# Patient Record
Sex: Female | Born: 1950 | Race: Black or African American | Hispanic: No | Smoking: Never smoker
Health system: Southern US, Academic
[De-identification: ages and names within clinical notes are randomized; demographics above are authoritative.]

## PROBLEM LIST (undated history)

## (undated) DIAGNOSIS — I10 Essential (primary) hypertension: Secondary | ICD-10-CM

## (undated) DIAGNOSIS — I2510 Atherosclerotic heart disease of native coronary artery without angina pectoris: Secondary | ICD-10-CM

## (undated) DIAGNOSIS — F329 Major depressive disorder, single episode, unspecified: Secondary | ICD-10-CM

## (undated) DIAGNOSIS — E669 Obesity, unspecified: Secondary | ICD-10-CM

## (undated) DIAGNOSIS — F32A Depression, unspecified: Secondary | ICD-10-CM

## (undated) DIAGNOSIS — Z973 Presence of spectacles and contact lenses: Secondary | ICD-10-CM

## (undated) DIAGNOSIS — E785 Hyperlipidemia, unspecified: Secondary | ICD-10-CM

## (undated) HISTORY — PX: HX CERVICAL SPINE SURGERY: 2100001197

## (undated) HISTORY — PX: HX LAMINOPLASTY: 2100001315

## (undated) HISTORY — PX: HX TMJ ARTHOTOMY: SHX35

## (undated) HISTORY — PX: HX CERVICAL DISKECTOMY: SHX98

## (undated) HISTORY — PX: HX HEART CATHETERIZATION: SHX148

## (undated) HISTORY — PX: CORONARY ARTERY ANGIOPLASTY: PR CATH30428

## (undated) HISTORY — PX: HX HYSTERECTOMY: SHX81

---
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
## 2012-01-29 ENCOUNTER — Other Ambulatory Visit (HOSPITAL_BASED_OUTPATIENT_CLINIC_OR_DEPARTMENT_OTHER): Payer: Self-pay | Admitting: EXTERNAL

## 2012-02-01 ENCOUNTER — Ambulatory Visit (HOSPITAL_BASED_OUTPATIENT_CLINIC_OR_DEPARTMENT_OTHER)
Admission: RE | Admit: 2012-02-01 | Discharge: 2012-02-01 | Disposition: A | Discharge status: Home / Self Care | Payer: Self-pay | Source: Ambulatory Visit

## 2012-02-01 ENCOUNTER — Ambulatory Visit (HOSPITAL_BASED_OUTPATIENT_CLINIC_OR_DEPARTMENT_OTHER)
Admission: RE | Admit: 2012-02-01 | Discharge: 2012-02-01 | Disposition: A | Discharge status: Home / Self Care | Payer: Self-pay | Source: Ambulatory Visit | Attending: EXTERNAL | Admitting: EXTERNAL

## 2012-02-01 DIAGNOSIS — I079 Rheumatic tricuspid valve disease, unspecified: Secondary | ICD-10-CM

## 2012-02-01 DIAGNOSIS — I359 Nonrheumatic aortic valve disorder, unspecified: Secondary | ICD-10-CM

## 2012-02-01 DIAGNOSIS — I10 Essential (primary) hypertension: Secondary | ICD-10-CM | POA: Insufficient documentation

## 2012-10-23 ENCOUNTER — Ambulatory Visit (HOSPITAL_BASED_OUTPATIENT_CLINIC_OR_DEPARTMENT_OTHER)
Admission: RE | Admit: 2012-10-23 | Discharge: 2012-10-23 | Disposition: A | Discharge status: Home / Self Care | Payer: Self-pay | Source: Ambulatory Visit

## 2012-10-23 DIAGNOSIS — Z0289 Encounter for other administrative examinations: Secondary | ICD-10-CM | POA: Insufficient documentation

## 2012-10-23 LAB — RUBELLA VIRUS IGG AB: RUBELLA IGG: 45 IU/mL

## 2012-10-25 LAB — MUMPS VIRUS ANTIBODY, IGG, SERUM
INDEX VALUE: 2.1
MUMPS VIRUS ANTIBODY, IGG, SERUM: POSITIVE

## 2012-10-25 LAB — VARICELLA-ZOSTER ANTIBODY, IGG, SERUM
VARICELLA IGG AB INDEX: 2.8
VARICELLA-ZOSTER VIRUS (VZV) ANTIBODY, IGG, SERUM: POSITIVE

## 2012-10-25 LAB — HEP BS AB, QUANT, POST VACCINE - BMC/JMC ONLY: HEPATITIS B SURFACE AB,QUANT: 21 m[IU]/mL (ref >=10)

## 2012-10-25 LAB — RUBEOLA (MEASLES) ANTIBODIES SCREEN, IGG, SERUM
MEASLES IGG AB INDEX: >8
RUBEOLA (MEASLES) ANTIBODIES SCREEN, IGG, SERUM: POSITIVE

## 2012-12-16 ENCOUNTER — Encounter (HOSPITAL_BASED_OUTPATIENT_CLINIC_OR_DEPARTMENT_OTHER): Payer: Self-pay

## 2012-12-16 ENCOUNTER — Inpatient Hospital Stay (HOSPITAL_BASED_OUTPATIENT_CLINIC_OR_DEPARTMENT_OTHER)
Admission: EM | Admit: 2012-12-16 | Discharge: 2012-12-20 | DRG: 247 | Disposition: A | Discharge status: Home / Self Care | Payer: BC Managed Care – PPO | Attending: Internal Medicine | Admitting: Internal Medicine

## 2012-12-16 ENCOUNTER — Emergency Department (HOSPITAL_BASED_OUTPATIENT_CLINIC_OR_DEPARTMENT_OTHER): Payer: BC Managed Care – PPO

## 2012-12-16 DIAGNOSIS — I2510 Atherosclerotic heart disease of native coronary artery without angina pectoris: Secondary | ICD-10-CM | POA: Diagnosis present

## 2012-12-16 DIAGNOSIS — E669 Obesity, unspecified: Secondary | ICD-10-CM | POA: Diagnosis present

## 2012-12-16 DIAGNOSIS — I214 Non-ST elevation (NSTEMI) myocardial infarction: Principal | ICD-10-CM | POA: Diagnosis present

## 2012-12-16 DIAGNOSIS — Z7982 Long term (current) use of aspirin: Secondary | ICD-10-CM

## 2012-12-16 DIAGNOSIS — Z6833 Body mass index (BMI) 33.0-33.9, adult: Secondary | ICD-10-CM

## 2012-12-16 DIAGNOSIS — F411 Generalized anxiety disorder: Secondary | ICD-10-CM | POA: Diagnosis present

## 2012-12-16 DIAGNOSIS — I10 Essential (primary) hypertension: Secondary | ICD-10-CM | POA: Diagnosis present

## 2012-12-16 HISTORY — DX: Obesity, unspecified: E66.9

## 2012-12-16 HISTORY — DX: Hyperlipidemia, unspecified: E78.5

## 2012-12-16 HISTORY — DX: Presence of spectacles and contact lenses: Z97.3

## 2012-12-16 HISTORY — DX: Essential (primary) hypertension: I10

## 2012-12-16 HISTORY — DX: Atherosclerotic heart disease of native coronary artery without angina pectoris: I25.10

## 2012-12-16 LAB — COMPREHENSIVE METABOLIC PROFILE - BMC/JMC ONLY
ALBUMIN: 3.8 g/dL (ref 3.2–5.0)
ALKALINE PHOSPHATASE: 92 IU/L (ref 35–120)
ALT (SGPT): 17 IU/L (ref 0–55)
AST (SGOT): 18 IU/L (ref 0–45)
BILIRUBIN, TOTAL: 0.7 mg/dL (ref 0.0–1.3)
BUN: 15 mg/dL (ref 6–22)
CALCIUM: 9.4 mg/dL (ref 8.5–10.5)
CARBON DIOXIDE: 31 mmol/L (ref 22–32)
CHLORIDE: 103 mmol/L (ref 101–111)
CREATININE: 0.79 mg/dL (ref 0.53–1.00)
ESTIMATED GLOMERULAR FILTRATION RATE: >60 mL/min (ref >60)
GLUCOSE: 107 mg/dL (ref 70–110)
POTASSIUM: 3.4 mmol/L — ABNORMAL LOW (ref 3.5–5.0)
SODIUM: 138 mmol/L (ref 136–145)
TOTAL PROTEIN: 6.1 g/dL (ref 6.0–8.0)

## 2012-12-16 LAB — CBC
BASOPHIL #: 0.04 10*3/uL (ref 0.00–0.10)
BASOPHILS %: 0.4 % (ref 0.0–1.4)
EOSINOPHIL #: 0.29 K/uL (ref 0.00–0.50)
EOSINOPHIL %: 2.9 % (ref 0.0–5.2)
HCT: 40.9 % (ref 36.0–45.0)
HGB: 12.8 g/dL (ref 12.0–15.5)
LYMPHOCYTE #: 1.61 K/uL (ref 0.70–3.20)
LYMPHOCYTE %: 16.6 % (ref 15.0–43.0)
MCH: 24.8 pg — ABNORMAL LOW (ref 28.0–34.0)
MCHC: 31.2 g/dL — ABNORMAL LOW (ref 33.0–37.0)
MCV: 79.7 fL — ABNORMAL LOW (ref 82.0–97.0)
MONOCYTE #: 0.65 K/uL (ref 0.20–0.90)
MONOCYTE %: 6.7 % (ref 4.8–12.0)
MPV: 7.7 fL (ref 7.0–9.4)
PLATELET COUNT: 348 10*3/uL (ref 150–400)
PMN #: 7.15 10*3/uL — ABNORMAL HIGH (ref 1.50–6.50)
PMN %: 73.4 % (ref 43.0–76.0)
RBC: 5.14 M/uL — ABNORMAL HIGH (ref 4.00–5.10)
RDW: 13.9 % — ABNORMAL HIGH (ref 11.0–13.0)
WBC: 9.8 K/uL (ref 4.0–11.0)

## 2012-12-16 LAB — TROPONIN-I
TROPONIN-I: 2.65 ng/mL — ABNORMAL HIGH (ref 0.00–0.06)
TROPONIN-I: 1.65 ng/mL — ABNORMAL HIGH (ref 0.00–0.06)

## 2012-12-16 LAB — CREATINE KINASE (CK), TOTAL, SERUM
CREATINE KINASE (CK): 167 IU/L (ref 0–230)
CREATINE KINASE (CK): 168 IU/L (ref 0–230)

## 2012-12-16 LAB — POC TROPONIN I BEDSIDE - BMC ONLY: TROPONIN I BEDSIDE - CITY ONLY: 0.1 ng/mL — ABNORMAL HIGH (ref <0.05)

## 2012-12-16 LAB — CREATINE KINASE (CK), TOTAL, SERUM OR PLASMA: CREATINE KINASE (CK): 81 IU/L (ref 0–230)

## 2012-12-16 LAB — CREATINE KINASE (CK), MB FRACTION, SERUM
CK-MB: 3.8 ng/mL (ref 0.0–6.3)
CK-MB: 17.4 ng/mL — ABNORMAL HIGH (ref 0.0–6.3)

## 2012-12-16 MED ORDER — ASPIRIN 325 MG TABLET
325.0000 mg | ORAL_TABLET | Freq: Every day | ORAL | Status: DC
Start: 2012-12-16 — End: 2012-12-17
  Filled 2012-12-16: qty 1

## 2012-12-16 MED ORDER — MORPHINE 2 MG/ML INJECTION SYRINGE
INJECTION | INTRAMUSCULAR | Status: AC
Start: 2012-12-16 — End: 2012-12-16
  Administered 2012-12-16: 2 mg via INTRAVENOUS
  Filled 2012-12-16: qty 1

## 2012-12-16 MED ORDER — ROSUVASTATIN 10 MG TABLET
10.0000 mg | ORAL_TABLET | Freq: Every evening | ORAL | Status: DC
Start: 2012-12-16 — End: 2012-12-17
  Filled 2012-12-16: qty 1

## 2012-12-16 MED ORDER — MORPHINE 2 MG/ML INJECTION SYRINGE
2.00 mg | INJECTION | INTRAMUSCULAR | Status: AC
Start: 2012-12-16 — End: 2012-12-16

## 2012-12-16 MED ORDER — CITALOPRAM 20 MG TABLET
40.0000 mg | ORAL_TABLET | Freq: Every day | ORAL | Status: DC
Start: 2012-12-16 — End: 2012-12-16

## 2012-12-16 MED ORDER — ENOXAPARIN 80 MG/0.8 ML SUB-Q SYRINGE - EAST
80.00 mg | INJECTION | SUBCUTANEOUS | Status: AC
Start: 2012-12-16 — End: 2012-12-16
  Administered 2012-12-16: 80 mg via SUBCUTANEOUS
  Filled 2012-12-16: qty 0.8

## 2012-12-16 MED ORDER — HYDROCHLOROTHIAZIDE 25 MG TABLET
25.00 mg | ORAL_TABLET | Freq: Every day | ORAL | Status: DC
Start: 2012-12-17 — End: 2012-12-19
  Administered 2012-12-17 – 2012-12-18 (×2): 25 mg via ORAL
  Administered 2012-12-19: 0 mg via ORAL
  Filled 2012-12-16 (×2): qty 1

## 2012-12-16 MED ORDER — CITALOPRAM 20 MG TABLET
40.0000 mg | ORAL_TABLET | Freq: Every day | ORAL | Status: DC
Start: 2012-12-16 — End: 2012-12-20
  Administered 2012-12-16: 0 mg via ORAL
  Administered 2012-12-17 – 2012-12-20 (×4): 40 mg via ORAL
  Filled 2012-12-16 (×4): qty 2

## 2012-12-16 MED ORDER — SODIUM CHLORIDE 0.9 % (FLUSH) INJECTION SYRINGE
10.0000 mL | INJECTION | Freq: Three times a day (TID) | INTRAMUSCULAR | Status: DC
Start: 2012-12-16 — End: 2012-12-18

## 2012-12-16 MED ORDER — ENOXAPARIN 80 MG/0.8 ML SUB-Q SYRINGE - EAST
80.00 mg | INJECTION | Freq: Two times a day (BID) | SUBCUTANEOUS | Status: DC
Start: 2012-12-16 — End: 2012-12-17
  Administered 2012-12-16 – 2012-12-17 (×2): 80 mg via SUBCUTANEOUS
  Filled 2012-12-16 (×2): qty 0.8

## 2012-12-16 MED ORDER — METOPROLOL TARTRATE 100 MG TABLET
100.00 mg | ORAL_TABLET | Freq: Two times a day (BID) | ORAL | Status: DC
Start: 2012-12-16 — End: 2012-12-20
  Administered 2012-12-16 – 2012-12-18 (×4): 100 mg via ORAL
  Administered 2012-12-18: 0 mg via ORAL
  Administered 2012-12-19 – 2012-12-20 (×3): 100 mg via ORAL
  Filled 2012-12-16 (×7): qty 1

## 2012-12-16 MED ORDER — NITROGLYCERIN 2 % TRANSDERMAL OINTMENT - PACKET
1.0000 [in_us] | TOPICAL_OINTMENT | Freq: Once | TRANSDERMAL | Status: AC
Start: 2012-12-16 — End: 2012-12-16
  Administered 2012-12-16: 1 [in_us] via TOPICAL
  Filled 2012-12-16: qty 2

## 2012-12-16 MED ORDER — ASPIRIN 81 MG CHEWABLE TABLET
324.0000 mg | CHEWABLE_TABLET | Freq: Once | ORAL | Status: AC
Start: 2012-12-16 — End: 2012-12-16
  Administered 2012-12-16: 324 mg via ORAL
  Filled 2012-12-16: qty 4

## 2012-12-16 MED ORDER — SODIUM CHLORIDE 0.9 % (FLUSH) INJECTION SYRINGE
10.0000 mL | INJECTION | Freq: Three times a day (TID) | INTRAMUSCULAR | Status: DC
Start: 2012-12-16 — End: 2012-12-20

## 2012-12-16 MED ADMIN — rosuvastatin 10 mg tablet: 10 mg | ORAL | NDC 00310075139

## 2012-12-16 MED ADMIN — sodium chloride 0.9 % (flush) injection syringe: 10 mL | INTRAVENOUS | NDC 08290306546

## 2012-12-16 MED ADMIN — aspirin 325 mg tablet: 0 mg | ORAL

## 2012-12-16 MED ADMIN — sodium chloride 0.9 % (flush) injection syringe: 0 mL | INTRAVENOUS

## 2012-12-16 NOTE — Nurses Notes (Signed)
Dr. Nolon Bussing made aware of elevated troponin. No new orders received. Will monitor.

## 2012-12-16 NOTE — ED Nurses Note (Signed)
SBAR report to 6th floor

## 2012-12-16 NOTE — ED Nurses Note (Signed)
Rounded on patient.  Reviewed vital signs and treatment plan.  Asked if patient had any needs, especially in the area of toileting, pain management and general comfort.  Addressed issues.  Asked the patient/family if they had any needs before I left the room.  Indicated that I would be back within the hour to evaluate them again and update them on throughput progress.  Call bell within reach.    Pt denies needs. Laying on Human resources officer television. Will continue to monitor.

## 2012-12-16 NOTE — ED Provider Notes (Signed)
Maurice Small, MD  [facility]  Emergency Department Visit Note    Date:  12/16/2012  Primary care provider:  Sid Falcon, MD  Means of arrival:  private car  History obtained from: patient  History limited by: none    Chief Complaint: Chest pain.    HISTORY OF PRESENT ILLNESS     [name], date of birth 11/12/1950, is a 62 y.o. female who presents to the Emergency Department complaining of chest pain.    Context: Patient reports to the ED with chest pain. Patient states that she was at rest when her chest pain began. Patient adds that her blood pressure was 187/101 at home.  Pertinent Past Medical History: Patient affirms a history of cardiac cauterizations in the distant past.   Onset: 1.5 hours ago  Timing: Waxing and waning  Location/Radiation: Mid sternal without radiation  Quality: Patient characterizes her pain as "pressure."  Severity: Patient rates the severity of her pain as a 7 out of 10.   Associated Symptoms:   Positive: Nausea and diaphoresis  Negative: Shortness of breath, back pain, vomiting, diarrhea, hematuria, hematochezia, fever, or cough    REVIEW OF SYSTEMS     The pertinent positive and negative symptoms are as per HPI. All other systems reviewed and are negative.     PATIENT HISTORY     Past Medical History:  Past Medical History   Diagnosis Date   . HTN (hypertension)    . Coronary artery disease      Past Surgical History:  Past Surgical History   Procedure Laterality Date   . Hx heart catheterization     . Hx hysterectomy     . Hx tmj arthotomy  x2   . Hx laminoplasty     . Hx cervical diskectomy       Family History:  No family history on file.    Social History:  History   Substance Use Topics   . Smoking status: Never Smoker    . Smokeless tobacco: Not on file   . Alcohol Use: No     History   Drug Use No     Medications:  Previous Medications    ASPIRIN 81 MG ORAL TABLET, CHEWABLE    Take 81 mg by mouth Once a day     CITALOPRAM (CELEXA) 40 MG ORAL TABLET    Take 40 mg by mouth Once a day    FLUCONAZOLE (DIFLUCAN) 50 MG ORAL TABLET    Take 75 mg by mouth Once a day    HYDROCHLOROTHIAZIDE (HYDRODIURIL) 25 MG ORAL TABLET    Take 25 mg by mouth Once a day    METOPROLOL (LOPRESSOR) 100 MG ORAL TABLET    Take 100 mg by mouth Twice daily     Allergies:  Allergies   Allergen Reactions   . Capoten (Captopril)    . Demerol (Meperidine)    . Dilaudid (Hydromorphone)      PHYSICAL EXAM     Vitals:   12/16/12 1138   BP: 145/85   Pulse: 94   Temp: 36.8 C (98.2 F)   Resp: 16   SpO2: 98%     Pulse ox  98% on None (Room Air) interpreted by me as: Normal    Physical Exam:   General: No apparent acute distress. Very pleasant.   Eyes: Conjunctiva are clear. Pupils are equal, round, and reactive to light and accommodation bilaterally.  HENT: Mucous membranes are moist. Nares are  clear. Posterior oropharynx is clear without erythema.  Neck: Supple. No meningeal signs.  Lungs: Clear to auscultation bilaterally. Good air movement.   Cardiovascular: Normal rate and regular rhythm. No murmurs, rubs or gallops.  Abdomen: Soft. Non-tender. No rebound, guarding, or peritoneal signs.   Extremities: Atraumatic. No cyanosis. No significant peripheral edema.  Skin: Warm and dry.  Neurologic: Strength and sensation grossly normal throughout.  Psychiatric: Alert and oriented x 3. Affect within normal limits.    DIAGNOSTIC STUDIES     Labs:    Results for orders placed during the hospital encounter of 12/16/12   CBC       Result Value Range    WBC 9.8  4.0 - 11.0 K/uL    RBC 5.14 (*) 4.00 - 5.10 M/uL    HGB 12.8  12.0 - 15.5 g/dL    HCT 16.1  09.6 - 04.5 %    MCV 79.7 (*) 82.0 - 97.0 fL    MCH 24.8 (*) 28.0 - 34.0 pg    MCHC 31.2 (*) 33.0 - 37.0 g/dL    RDW 40.9 (*) 81.1 - 13.0 %    PLATELET COUNT 348  150 - 400 K/uL    MPV 7.7  7.0 - 9.4 fL    PMN % 73.4  43.0 - 76.0 %    LYMPHOCYTE % 16.6  15.0 - 43.0 %    MONOCYTE % 6.7  4.8 - 12.0 %     EOSINOPHIL % 2.9  0.0 - 5.2 %    BASOPHILS % 0.4  0.0 - 1.4 %    PMN # 7.15 (*) 1.50 - 6.50 K/uL    LYMPHOCYTE # 1.61  0.70 - 3.20 K/uL    MONOCYTE # 0.65  0.20 - 0.90 K/uL    EOSINOPHIL # 0.29  0.00 - 0.50 K/uL    BASOPHIL # 0.04  0.00 - 0.10 K/uL   COMPREHENSIVE METABOLIC PROFILE - [facility] ONLY       Result Value Range    GLUCOSE 107  70 - 110 mg/dL    BUN 15  6 - 22 mg/dL    CREATININE 9.14  7.82 - 1.00 mg/dL    ESTIMATED GLOMERULAR FILTRATION RATE >60  >60 ml/min    SODIUM 138  136 - 145 mmol/L    POTASSIUM 3.4 (*) 3.5 - 5.0 mmol/L    CHLORIDE 103  101 - 111 mmol/L    CARBON DIOXIDE 31  22 - 32 mmol/L    CALCIUM 9.4  8.5 - 10.5 mg/dL    TOTAL PROTEIN 6.1  6.0 - 8.0 g/dL    ALBUMIN 3.8  3.2 - 5.0 g/dL    BILIRUBIN, TOTAL 0.7  0.0 - 1.3 mg/dL    AST (SGOT) 18  0 - 45 IU/L    ALT (SGPT) 17  0 - 55 IU/L    ALKALINE PHOSPHATASE 92  35 - 120 IU/L   CREATINE KINASE (CK), MB FRACTION, SERUM       Result Value Range    CK-MB 3.8  0.0 - 6.3 ng/mL   CREATINE KINASE (CK), TOTAL, SERUM       Result Value Range    CREATINE KINASE (CK) 81  0 - 230 IU/L   POC TROPONIN I BEDSIDE - [facility] ONLY       Result Value Range    TROPONIN I BEDSIDE - [facility] ONLY 0.10 (*) <0.05 ng/mL     Labs reviewed and interpreted by me.    Radiology:  XR CHEST AP PORTABLE: No acute findings.   Radiological imaging interpreted by radiologist and independently reviewed by me.    EKG:  12-lead EKG interpreted by me shows normal sinus rhythm, rate of 89 bpm, Qs in leads III and AVF. Flipped Ts in leads aVL and V2. Otherwise no definite acute ST segment changes.    ED PROGRESS NOTE / MEDICAL DECISION MAKING     I have reviewed this patient's current vital signs. In addition I have examined the available pertinent past medical records and the notes by our nursing staff for this visit. Patient had IV access established and was placed on a monitor throughout her stay.    Orders Placed This Encounter   . XR CHEST AP PORTABLE   . CBC    . COMPREHENSIVE METABOLIC PROFILE - [facility] ONLY   . CREATINE KINASE (CK) MB ISOENZYME   . CREATINE KINASE (CK), TOTAL   . POCT TROPONIN I BEDSIDE - [facility] ONLY   . ECG 12-LEAD (Take to provider with a brief history)   . INSERT & MAINTAIN PERIPHERAL IV ACCESS   . NS flush syringe   . aspirin chewable tablet 324 mg   . nitroglycerin (NITRO-BID) 2 % topical ointment   . morphine 2 mg/mL injection   . enoxaparin (LOVENOX) 80 mg/0.8 mL SubQ injection     Patient was initially treated with Nitroglycerin topical ointment and Aspirin PO. Chest XR, EKG, and labs ordered.    1:09 PM: POCT called with positive Troponin.      1:32 PM: Patient was treated with Morphine IV for continued pain.    1:51 PM: Paged Dr. Nolon Bussing (hospitalist).    1:54 PM: Patient was treated with Lovenox SC at this time.    2:03 PM: I discussed the patient's case and above findings with Dr. Nolon Bussing (hospitalist) who is making arrangements to keep the patient in hospital for further workup and treatment at this time.    2:14 PM: On recheck, the patient is essentially pain-free at this time. I counseled the patient regarding her lab and radiology results. I discussed with the patient the need for further work-up and treatment in the hospital. I informed the patient that Dr. Nolon Bussing (hospitalist) will arrange this. All questions and concerns were answered to her satisfaction and she is in accordance with the plan of care.    Pre-Disposition Vitals:   12/16/12 1300 12/16/12 1329 12/16/12 1330 12/16/12 1352   BP: 128/72 128/72 134/80 134/80   Pulse: 79 82 78 83   Temp:       Resp: 17 15 17 11    SpO2: 99% 99% 99% 100%       CLINICAL IMPRESSION     1. Acute non-ST-elevation myocardial infarction  2. Acute chest pain secondary to the above  3. Acute coronary syndrome  4. Acute unstable angina     DISPOSITION/PLAN     Admit -- Dr. Nolon Bussing (Hospitalist Service)      Condition at Disposition: Fair        SCRIBE ATTESTATION STATEMENT   I Abigail Miyamoto, SCRIBE scribed for Maurice Small, MD on 12/16/2012 at 12:09 PM.    Documentation assistance provided for Maurice Small, MD  by Abigail Miyamoto, SCRIBE. Information recorded by the scribe was done at my direction and has been reviewed and validated by me Herbie Drape, Carlis Stable, MD.

## 2012-12-16 NOTE — ED Nurses Note (Signed)
 Rounded on patient.  Reviewed vital signs and treatment plan.  Asked if patient had any needs, especially in the area of toileting, pain management and general comfort.  Addressed issues.  Asked the patient/family if they had any needs before I left the room.  Indicated that I would be back within the hour to evaluate them again and update them on throughput progress.  Call bell within reach.    Pt denies needs. Laying on Human resources officer television. Pt reports no chest pain at this time. Pt states that she feels sore where her pain was in her chest, however is denying pain. Will continue to monitor.

## 2012-12-16 NOTE — ED Nurses Note (Signed)
Pt c/o mid sternal chest pains that started 1.5 hours ago.  Pt states she was at rest when it started and pt reports nausea and diaphoresis.

## 2012-12-16 NOTE — ED Nurses Note (Signed)
 Margie, Tech from 6th floor called to inform that patients room number has been changed to 602

## 2012-12-16 NOTE — Nurses Notes (Signed)
NOTIFIED KRISTI AT ANSWERING SERVICE OF CONSULT AT 7:06PM 12/16/12

## 2012-12-16 NOTE — ED Nurses Note (Signed)
 Pt assisted on and off bedside commode without incident. Will continue to monitor.

## 2012-12-16 NOTE — H&P (Signed)
 [facility]  [place], [place] [address]    General Medicine  Admission H&P    Date of Service:  12/16/2012  [name],[name], 62 y.o. female  Date of Admission:  12/16/2012  Date of Birth:  [birth year]  PCP: Eva Coma, MD    Information Obtained from: patient  Chief Complaint:  Chest pain     HPI: [name] is a 62 y.o., Black/African American female who presents with  New onset of chest pain that started  This am while she was resting . Pt reports it felt like tremendous pressure in the middle of the chest that made her feel very weak , she was diaphoretic and was unable to breath , cp lasted for few minutes and resolved on its own but then she felt it again so called her daughter who brought her to er . Pt reports she have had cardiac cath but no stents in the past .   Denies cough , fever , sob , palpitations , nausea or vomiting     PAST MEDICAL:    Past Medical History   Diagnosis Date   . HTN (hypertension)    . Coronary artery disease    . Wears glasses      Past Surgical History   Procedure Laterality Date   . Hx heart catheterization     . Hx hysterectomy     . Hx tmj arthotomy  x2   . Hx laminoplasty     . Hx cervical diskectomy       Medications Prior to Admission    Outpatient Medications    aspirin  81 mg Oral Tablet, Chewable    Take 81 mg by mouth Once a day    citalopram  (CELEXA ) 40 mg Oral Tablet    Take 40 mg by mouth Once a day    fluconazole (DIFLUCAN) 50 mg Oral Tablet    Take 75 mg by mouth Once a day    hydrochlorothiazide  (HYDRODIURIL ) 25 mg Oral Tablet    Take 25 mg by mouth Once a day    metoprolol  (LOPRESSOR ) 100 mg Oral Tablet    Take 100 mg by mouth Twice daily        Allergies   Allergen Reactions   . Capoten (Captopril)    . Demerol (Meperidine)    . Dilaudid (Hydromorphone)          Family History  Family History   Problem Relation Age of Onset   . Hypertension Sister    . Diabetes Sister    . Hypertension Maternal Grandmother        Social  History  History     Social History   . Marital Status: Widowed     Spouse Name: N/A     Number of Children: N/A   . Years of Education: N/A     Occupational History   . Not on file.     Social History Main Topics   . Smoking status: Never Smoker    . Smokeless tobacco: Not on file   . Alcohol Use: No   . Drug Use: No   . Sexually Active: Not on file     Other Topics Concern   . Not on file     Social History Narrative   . No narrative on file       ROS: Other than ROS in the HPI, all other systems were negative.    Examination:  Temperature: 37 C (98.6 F)  Heart Rate: 83  BP (Non-Invasive): 127/73 mmHg  Respiratory Rate: 16  SpO2-1: 100 %  Pain Score (Numeric, Faces): 0  General: appears in good health, moderately obese and appears stated age  Eyes: Conjunctiva clear., Pupils equal and round. , Sclera non-icteric.   HENT:Head atraumatic and normocephalic, ENT without erythema or injection, mucous membranes moist.  Neck: No JVD  Lungs: Clear to auscultation bilaterally.   Cardiovascular: regular rate and rhythm, S1, S2 normal, no murmur, click, rub or gallop  Abdomen: Soft, non-tender, Bowel sounds normal  Genito-urinary: Deferred  Extremities: No cyanosis or edema  Skin: Skin warm and dry  Neurologic: Grossly normal, CN II - XII grossly intact , Alert and oriented x3, Motor: No weakness.   Lymphatics: No lymphadenopathy  Psychiatric: Normal affect, behavior, memory, thought content, judgement, and speech.    Labs:    Lab Results for Last 24 Hours:    Results for orders placed during the hospital encounter of 12/16/12 (from the past 24 hour(s))   CBC       Result Value Range    WBC 9.8  4.0 - 11.0 K/uL    RBC 5.14 (*) 4.00 - 5.10 M/uL    HGB 12.8  12.0 - 15.5 g/dL    HCT 59.0  63.9 - 54.9 %    MCV 79.7 (*) 82.0 - 97.0 fL    MCH 24.8 (*) 28.0 - 34.0 pg    MCHC 31.2 (*) 33.0 - 37.0 g/dL    RDW 86.0 (*) 88.9 - 13.0 %    PLATELET COUNT 348  150 - 400 K/uL    MPV 7.7  7.0 - 9.4 fL    PMN % 73.4  43.0 - 76.0 %     LYMPHOCYTE % 16.6  15.0 - 43.0 %    MONOCYTE % 6.7  4.8 - 12.0 %    EOSINOPHIL % 2.9  0.0 - 5.2 %    BASOPHILS % 0.4  0.0 - 1.4 %    PMN # 7.15 (*) 1.50 - 6.50 K/uL    LYMPHOCYTE # 1.61  0.70 - 3.20 K/uL    MONOCYTE # 0.65  0.20 - 0.90 K/uL    EOSINOPHIL # 0.29  0.00 - 0.50 K/uL    BASOPHIL # 0.04  0.00 - 0.10 K/uL   COMPREHENSIVE METABOLIC PROFILE - [facility] ONLY       Result Value Range    GLUCOSE 107  70 - 110 mg/dL    BUN 15  6 - 22 mg/dL    CREATININE 9.20  9.46 - 1.00 mg/dL    ESTIMATED GLOMERULAR FILTRATION RATE >60  >60 ml/min    SODIUM 138  136 - 145 mmol/L    POTASSIUM 3.4 (*) 3.5 - 5.0 mmol/L    CHLORIDE 103  101 - 111 mmol/L    CARBON DIOXIDE 31  22 - 32 mmol/L    CALCIUM  9.4  8.5 - 10.5 mg/dL    TOTAL PROTEIN 6.1  6.0 - 8.0 g/dL    ALBUMIN 3.8  3.2 - 5.0 g/dL    BILIRUBIN, TOTAL 0.7  0.0 - 1.3 mg/dL    AST (SGOT) 18  0 - 45 IU/L    ALT (SGPT) 17  0 - 55 IU/L    ALKALINE PHOSPHATASE 92  35 - 120 IU/L   CREATINE KINASE (CK), MB FRACTION, SERUM       Result Value Range    CK-MB 3.8  0.0 - 6.3 ng/mL   CREATINE  KINASE (CK), TOTAL, SERUM       Result Value Range    CREATINE KINASE (CK) 81  0 - 230 IU/L   POC TROPONIN I BEDSIDE - [facility] ONLY       Result Value Range    TROPONIN I BEDSIDE - [facility] ONLY 0.10 (*) <0.05 ng/mL       Imaging Studies: Radiology:   XR CHEST AP PORTABLE: No acute findings.   Radiological imaging interpreted by radiologist and independently reviewed by me.   EKG:   12-lead EKG interpreted by me shows normal sinus rhythm, rate of 89 bpm, Qs in leads III and AVF. Flipped Ts in leads aVL and V2. Otherwise no definite acute ST segment changes.       DNR Status:  Full Code    Assessment/Plan:   Active Hospital Problems    Diagnosis   . Primary Problem: NSTEMI (non-ST elevated myocardial infarction)   . HTN (hypertension)   . Coronary artery disease       1. NSTEMI -  Admit to tele , trend cardiac enzymes , repeat EKG IF CHEST PAIN RECURS , ASA 325 mg daily  , added  Crestor  , c/w metoprolol  ,  anticoagulation with Lovenox  bid , echo , cardiology consult     2. HTN - controlled c/w metoprolol  , HCTZ       3.  Anxiety - c/w celexa          DVT/PE Prophylaxis: Lovenox 

## 2012-12-16 NOTE — ED Nurses Note (Signed)
Patient was placed on the Cardiac monitor, O2 and Blood Pressure cuff for monitoring.

## 2012-12-17 ENCOUNTER — Encounter (HOSPITAL_BASED_OUTPATIENT_CLINIC_OR_DEPARTMENT_OTHER): Payer: Self-pay | Admitting: CARDIOVASCULAR DISEASE

## 2012-12-17 ENCOUNTER — Inpatient Hospital Stay (HOSPITAL_BASED_OUTPATIENT_CLINIC_OR_DEPARTMENT_OTHER): Payer: BC Managed Care – PPO

## 2012-12-17 DIAGNOSIS — E785 Hyperlipidemia, unspecified: Secondary | ICD-10-CM

## 2012-12-17 DIAGNOSIS — I214 Non-ST elevation (NSTEMI) myocardial infarction: Secondary | ICD-10-CM

## 2012-12-17 DIAGNOSIS — I519 Heart disease, unspecified: Secondary | ICD-10-CM

## 2012-12-17 DIAGNOSIS — I10 Essential (primary) hypertension: Secondary | ICD-10-CM

## 2012-12-17 DIAGNOSIS — I359 Nonrheumatic aortic valve disorder, unspecified: Secondary | ICD-10-CM

## 2012-12-17 DIAGNOSIS — E669 Obesity, unspecified: Secondary | ICD-10-CM

## 2012-12-17 LAB — CBC
BASOPHIL #: 0.05 10*3/uL (ref 0.00–0.10)
BASOPHIL #: 0.07 10*3/uL (ref 0.00–0.10)
BASOPHILS %: 0.6 % (ref 0.0–1.4)
BASOPHILS %: 0.7 % (ref 0.0–1.4)
EOSINOPHIL #: 0.28 K/uL (ref 0.00–0.50)
EOSINOPHIL #: 0.3 K/uL (ref 0.00–0.50)
EOSINOPHIL %: 3.1 % (ref 0.0–5.2)
EOSINOPHIL %: 3.1 % (ref 0.0–5.2)
HCT: 40.5 % (ref 36.0–45.0)
HCT: 38.5 % (ref 36.0–45.0)
HGB: 12.5 g/dL (ref 12.0–15.5)
HGB: 12.5 g/dL (ref 12.0–15.5)
LYMPHOCYTE #: 2.18 K/uL (ref 0.70–3.20)
LYMPHOCYTE #: 2.43 10*3/uL (ref 0.70–3.20)
LYMPHOCYTE %: 24.4 % (ref 15.0–43.0)
LYMPHOCYTE %: 24.9 % (ref 15.0–43.0)
MCH: 24.9 pg — ABNORMAL LOW (ref 28.0–34.0)
MCH: 25.9 pg — ABNORMAL LOW (ref 28.0–34.0)
MCHC: 30.7 g/dL — ABNORMAL LOW (ref 33.0–37.0)
MCHC: 32.6 g/dL — ABNORMAL LOW (ref 33.0–37.0)
MCV: 80.9 fL — ABNORMAL LOW (ref 82.0–97.0)
MCV: 79.4 fL — ABNORMAL LOW (ref 82.0–97.0)
MONOCYTE #: 0.65 K/uL (ref 0.20–0.90)
MONOCYTE #: 0.7 10*3/uL (ref 0.20–0.90)
MONOCYTE %: 7.3 % (ref 4.8–12.0)
MONOCYTE %: 7.2 % (ref 4.8–12.0)
MPV: 8.4 fL (ref 7.0–9.4)
MPV: 7.8 fL (ref 7.0–9.4)
PLATELET COUNT: 343 K/uL (ref 150–400)
PLATELET COUNT: 340 10*3/uL (ref 150–400)
PMN #: 5.75 10*3/uL (ref 1.50–6.50)
PMN #: 6.26 10*3/uL (ref 1.50–6.50)
PMN %: 64.5 % (ref 43.0–76.0)
PMN %: 64.1 % (ref 43.0–76.0)
RBC: 5.01 M/uL (ref 4.00–5.10)
RBC: 4.85 M/uL (ref 4.00–5.10)
RDW: 14.1 % — ABNORMAL HIGH (ref 11.0–13.0)
RDW: 13.8 % — ABNORMAL HIGH (ref 11.0–13.0)
WBC: 8.9 K/uL (ref 4.0–11.0)
WBC: 9.8 10*3/uL (ref 4.0–11.0)

## 2012-12-17 LAB — LIPID PANEL
CHOL/HDL RATIO: 5.1
CHOLESTEROL: 284 mg/dL — ABNORMAL HIGH (ref 120–199)
HDL-CHOLESTEROL: 56 mg/dL (ref >39)
LDL (CALCULATED): 208 mg/dL — ABNORMAL HIGH (ref <130)
TRIGLYCERIDES: 101 mg/dL (ref <150)
VLDL (CALCULATED): 20 mg/dL (ref 5–35)

## 2012-12-17 LAB — BASIC METABOLIC PROFILE - BMC/JMC ONLY
BUN: 13 mg/dL (ref 6–22)
CALCIUM: 9.4 mg/dL (ref 8.5–10.5)
CARBON DIOXIDE: 33 mmol/L — ABNORMAL HIGH (ref 22–32)
CHLORIDE: 102 mmol/L (ref 101–111)
CREATININE: 0.73 mg/dL (ref 0.53–1.00)
ESTIMATED GLOMERULAR FILTRATION RATE: >60 mL/min (ref >60)
GLUCOSE: 106 mg/dL (ref 70–110)
POTASSIUM: 3.2 mmol/L — ABNORMAL LOW (ref 3.5–5.0)
SODIUM: 139 mmol/L (ref 136–145)

## 2012-12-17 LAB — CREATINE KINASE (CK), TOTAL, SERUM OR PLASMA: CREATINE KINASE (CK): 166 IU/L (ref 0–230)

## 2012-12-17 LAB — TROPONIN-I: TROPONIN-I: 0.99 ng/mL (ref 0.00–0.06)

## 2012-12-17 LAB — MAGNESIUM: MAGNESIUM: 2 mg/dL (ref 1.7–2.5)

## 2012-12-17 LAB — CREATINE KINASE (CK), MB FRACTION, SERUM: CK-MB: 15.4 ng/mL (ref 0.0–6.3)

## 2012-12-17 LAB — ZZAPTT, THERAPUTIC: THERAPEUTIC APTT: 48.8 s (ref 45.6–74.8)

## 2012-12-17 MED ORDER — MORPHINE 4 MG/ML INJECTION SYRINGE
4.00 mg | INJECTION | INTRAMUSCULAR | Status: DC | PRN
Start: 2012-12-17 — End: 2012-12-20
  Administered 2012-12-17 – 2012-12-19 (×2): 4 mg via INTRAVENOUS
  Filled 2012-12-17 (×2): qty 1

## 2012-12-17 MED ORDER — NITROGLYCERIN 50 MG/250 ML (200 MCG/ML) IN 5 % DEXTROSE INTRAVENOUS
2.50 ug/min | INTRAVENOUS | Status: DC
Start: 2012-12-17 — End: 2012-12-18
  Administered 2012-12-17: 0 ug/min via INTRAVENOUS
  Filled 2012-12-17: qty 250

## 2012-12-17 MED ORDER — LOSARTAN 25 MG TABLET
25.0000 mg | ORAL_TABLET | Freq: Every day | ORAL | Status: DC
Start: 2012-12-17 — End: 2012-12-20
  Filled 2012-12-17 (×5): qty 1

## 2012-12-17 MED ORDER — ROSUVASTATIN 20 MG TABLET
20.00 mg | ORAL_TABLET | Freq: Every evening | ORAL | Status: DC
Start: 2012-12-17 — End: 2012-12-20
  Administered 2012-12-17 – 2012-12-19 (×3): 20 mg via ORAL
  Filled 2012-12-17 (×3): qty 1

## 2012-12-17 MED ORDER — POTASSIUM CHLORIDE ER 20 MEQ TABLET,EXTENDED RELEASE(PART/CRYST)
20.0000 meq | ORAL_TABLET | Freq: Once | ORAL | Status: AC
Start: 2012-12-17 — End: 2012-12-17
  Administered 2012-12-17: 20 meq via ORAL
  Filled 2012-12-17: qty 1

## 2012-12-17 MED ORDER — POTASSIUM CHLORIDE ER 20 MEQ TABLET,EXTENDED RELEASE(PART/CRYST)
40.0000 meq | ORAL_TABLET | Freq: Once | ORAL | Status: AC
Start: 2012-12-17 — End: 2012-12-17
  Filled 2012-12-17: qty 2

## 2012-12-17 MED ORDER — SODIUM CHLORIDE 0.9 % INTRAVENOUS SOLUTION
INTRAVENOUS | Status: DC
Start: 2012-12-18 — End: 2012-12-19
  Administered 2012-12-19: 0 via INTRAVENOUS

## 2012-12-17 MED ORDER — HEPARIN (PORCINE) 5,000 UNITS/ML BOLUS - CHI
4000.0000 [IU] | Freq: Once | INTRAMUSCULAR | Status: AC
Start: 2012-12-17 — End: 2012-12-17
  Administered 2012-12-17: 4000 [IU] via INTRAVENOUS
  Filled 2012-12-17: qty 1

## 2012-12-17 MED ORDER — EPTIFIBATIDE 0.75 MG/ML INTRAVENOUS SOLUTION
2.0000 ug/kg/min | INTRAVENOUS | Status: DC
Start: 2012-12-17 — End: 2012-12-18
  Administered 2012-12-17 – 2012-12-18 (×2): 2 ug/kg/min via INTRAVENOUS
  Filled 2012-12-17 (×4): qty 100

## 2012-12-17 MED ORDER — SODIUM CHLORIDE 0.9 % (FLUSH) INJECTION SYRINGE
10.0000 mL | INJECTION | Freq: Three times a day (TID) | INTRAMUSCULAR | Status: DC
Start: 2012-12-17 — End: 2012-12-19
  Administered 2012-12-17: 0 mL via INTRAVENOUS
  Administered 2012-12-17: 10 mL via INTRAVENOUS
  Administered 2012-12-18 – 2012-12-19 (×4): 0 mL via INTRAVENOUS

## 2012-12-17 MED ORDER — HEPARIN (PORCINE) 5,000 UNITS/ML BOLUS - CHI
0.0000 [IU] | Freq: Four times a day (QID) | INTRAMUSCULAR | Status: DC | PRN
Start: 2012-12-17 — End: 2012-12-18

## 2012-12-17 MED ORDER — EPTIFIBATIDE 2 MG/ML INTRAVENOUS SOLUTION
180.0000 ug/kg | Freq: Once | INTRAVENOUS | Status: AC
Start: 2012-12-17 — End: 2012-12-17
  Administered 2012-12-17: 15.86 mg via INTRAVENOUS
  Filled 2012-12-17 (×2): qty 10

## 2012-12-17 MED ORDER — ASPIRIN 81 MG CHEWABLE TABLET
81.0000 mg | CHEWABLE_TABLET | Freq: Every day | ORAL | Status: DC
Start: 2012-12-18 — End: 2012-12-20
  Administered 2012-12-18 – 2012-12-20 (×3): 81 mg via ORAL
  Filled 2012-12-17 (×3): qty 1

## 2012-12-17 MED ORDER — HEPARIN (PORCINE) IN DEXTROSE 5 % 25,000 UNIT/500 ML IV - CHI
12.0000 [IU]/kg/h | INTRAVENOUS | Status: DC
Start: 2012-12-17 — End: 2012-12-18
  Administered 2012-12-17: 1060 [IU]/h via INTRAVENOUS
  Administered 2012-12-18: 760 [IU]/h via INTRAVENOUS
  Administered 2012-12-18: 0 [IU]/h via INTRAVENOUS
  Administered 2012-12-18: 0 [IU]/kg/h via INTRAVENOUS
  Filled 2012-12-17: qty 500

## 2012-12-17 MED ORDER — NITROGLYCERIN 0.4 MG SUBLINGUAL TABLET
0.40 mg | SUBLINGUAL_TABLET | SUBLINGUAL | Status: DC | PRN
Start: 2012-12-17 — End: 2012-12-20
  Administered 2012-12-17: 0.4 mg via SUBLINGUAL
  Filled 2012-12-17: qty 1

## 2012-12-17 MED ADMIN — sodium chloride 0.9 % (flush) injection syringe: 10 mL | INTRAVENOUS | NDC 08881570121

## 2012-12-17 MED ADMIN — sodium chloride 0.9 % (flush) injection syringe: 0 mL | INTRAVENOUS

## 2012-12-17 MED ADMIN — aspirin 325 mg tablet: 325 mg | ORAL | NDC 66553000101

## 2012-12-17 MED ADMIN — losartan 25 mg tablet: 25 mg | ORAL | NDC 68084034611

## 2012-12-17 MED ADMIN — potassium chloride ER 20 mEq tablet,extended release(part/cryst): 40 meq | ORAL | NDC 00245005889

## 2012-12-17 NOTE — Nurses Notes (Signed)
Report called to Jan on patient transfer to ICU bed 4

## 2012-12-17 NOTE — [facility] REVIEW (Signed)
 [facility] - Initial Review     Working DRG 1:  316  Problem List:   NSTEMI 58929, CAD 41400, HTN 4019    Procedure:    PMH:  HTN 4019, CAD 41400,     V/S:  12/17/12 Ht 5'5", Wt 194, T 98.2, P 94, R 16, BP 145/85, Spo2 98    Medications:  ASA, Celexa , HCTZ, Cozaar , Lopressor , Crestor     Labs:  6/23 WBC 9.8, Hgb 12.8, Hct 40.9, K 3.4, Trop 0.10/2.65/1.65, CKMB 3.8/17.4  6/24 K 3.2, Trop 0.99, CKMB 15.4    Diagnostic:  6/23 CXR - No acute findings    Cardiac Monitor:  normal sinus rhythm    HPI:  6/23 ER cc Chest pain. at rest when her chest pain began. Mid sternal without radiation characterizes her pain as pressure. Positive: Nausea and diaphoresis  Acute non-ST-elevation myocardial infarction  Acute chest pain secondary to the above  Acute coronary syndrome  Acute unstable angina    H&P:  6/23 Kenn - New onset of chest pain that started This am while she was resting . tremendous pressure in the middle of the chest that made her feel very weak , she was diaphoretic and was unable to breath , cp lasted for few minutes and resolved on its own but then she felt it again   1. NSTEMI - Admit to tele , trend cardiac enzymes , repeat EKG IF CHEST PAIN RECURS , ASA 325 mg daily , added Crestor  , c/w metoprolol  , anticoagulation with Lovenox  bid , echo , cardiology consult   2. HTN - controlled c/w metoprolol  , HCTZ   3. Anxiety - c/w celexa      Consults:    Progress Notes:    Operative Report:    Nurse Notes:     D/C Summary:

## 2012-12-17 NOTE — Nurses Notes (Signed)
Upon assessment pt's gums are bleeding a small amount; currently on heparin and integrilin gtts. No active bleeding noted to any other area, VSS. MD paged.

## 2012-12-17 NOTE — Nurses Notes (Signed)
Chest pressure 7/10.  bp 133/76 hr 77.  Morphine given.

## 2012-12-17 NOTE — Nurses Notes (Signed)
Cardiologist paged x2 with no return call to ICU.  Hospitalist paged.    Dr. Harvie Junior made aware of bleeding gums. To monitor pt for further bleeding after oral care.   Will continue to monitor. Manfred Arch, RN

## 2012-12-17 NOTE — Consults (Signed)
[facility]                                  [place], [place] [address]                                     [phone]                             CONSULTATION    PATIENT NAME: [name], [name] [facility] NUMBER:[id number]  DATE OF SERVICE:12/17/2012  DATE OF BIRTH: Dec 21, 1950    REFERRING PHYSICIAN:  Dr. Chanda Busing    PRIMARY CARE PHYSICIAN:  Dr. Sid Falcon    REASON FOR CONSULT:  Chest pain.    HISTORY OF PRESENT ILLNESS:  This is a very pleasant 62 year old [place] American/black female who has known coronary artery disease, which she describes was defined about 5-7 years ago by cardiac catheterization in [place].  At that time, she was told that she had "30% blockage."  Following that, she was taking antihypertensive therapy and aspirin, however, was not on any statin therapy.  She states that she was in her usual state of health until yesterday.  She was taking a shower when she started noticing chest heaviness and pressure present in the region of the sternal area.  It slowly became worse to about 10/10 in severity.  Symptoms persisted and hence, she decided to come to the Emergency Department for evaluation.  She was given morphine, following which her symptoms abated.  She was then placed on subcutaneous Lovenox and medical therapy, and was admitted for observation.  Cardiac troponin I came back elevated at 2.65 and is now trending down to 0.99.  CK-MB was elevated at 17.4, trending down to 15.4 as well.  She has had recurrence of chest discomfort this afternoon, which subsided with 2 mg of morphine and 1 sublingual nitroglycerin.  Currently, she states that her pain is more or less dissipated.    EKGs performed during this admission were personally reviewed.  On June 23, at 11:39 a.m. she had a normal sinus rhythm, a rate of about 89 beats a minute.  There was minimal limb lead criteria for left ventricular  hypertrophy and nonspecific ST changes in the septal leads.  There is leftward axis as well.  Repeat EKG performed on December 17, 2012, at 7:11 a.m. revealed sinus rhythm, at a rate of 66 beats per minute and deep T-wave inversion in anteroseptal leads, which are concerning for ischemia in the anterolateral region.  These T-wave inversions are prominent in the current EKG compared to one from yesterday.    Review of the chart shows an echocardiogram done in August 2013, that revealed normal ejection fraction of 55-60% with mild tricuspid regurgitation and trivial mitral insufficiency.      The patient denies any radiation of pain.  She has no nausea or vomiting.  She says she sweats at baseline, so it is not a change for her.  She denies any lightheadedness, dizziness, syncope, or presyncope.  She has no orthopnea, proximal dyspnea or lower extremity edema.  Previously she  states that she has had occasional sharp chest pain but it has never been similar to what she has been having since yesterday.    PAST MEDICAL HISTORY:  1.  Mild coronary disease by catheterization about 5-7 years ago in [place].  2.  Hypertension.  3.  Obesity.  4.  Dyslipidemia.  5.  The patient denies history of diabetes mellitus.    PAST SURGICAL HISTORY:  1.  Status post cardiac catheterization 5-7 years ago.  2.  Status post hysterectomy.  3.  Status post TMJ surgery x2.  4.  Status post laminectomy/laminoplasty, both upper and lower spine.  5.  Cervical diskectomy.    FAMILY HISTORY:  Sister has hypertension and diabetes.  Maternal grandmother had hypertension.    SOCIAL HISTORY:  She has never smoked, no alcohol use, no illicit drug use.    MEDICATIONS:    1.  Hydrochlorothiazide 25 milligrams once a day.  2.  Lopressor 100 milligrams twice a day.  3.  Celexa 40 milligrams once a day.  4.  Cozaar 25 milligrams once a day.  5.  Aspirin.    ALLERGIES:  CAPOTEN, MEPERIDINE, DILAUDID.     REVIEW OF SYSTEMS:  The patient denies any bleeding  problems.  Denies any hematuria, hematochezia, melena, abdominal pain.  She has had no TIA, seizures or strokes.  She denies any rashes, bruises, or petechia.  She is not planning any elective surgeries in the near future.  She has no previous history of heart failure or myocardial infarction.  She denies any cough, sputum production, wheezing.  The remainder of the 10-system review is negative/noncontributory to the problems being assessed except for already mentioned above.    PHYSICAL EXAMINATION:      VITAL SIGNS:  Vital signs were reviewed.  Blood pressure 121/83, pulse 79, temperature 37.1, oxygen saturation 96%.    GENERAL:  A pleasant black female who was lying in bed.  She appears to be comfortable.  Her daughter was at her bedside.  She is overweight.    HEENT:  Head is atraumatic, normocephalic.  Eyes are anicteric, conjunctiva is clear and nonicteric.     NECK:  No jugular venous distention, no carotid bruits.  Carotid upstrokes are normal.  Trachea is central.  Neck is supple.    LUNGS:  Clear to auscultation, no crackles, no wheezing.  Good air entry.    CARDIOVASCULAR:  Regular rate and rhythm, S1, S2.  I did not appreciate any added sounds, rubs, murmurs, or gallops.    ABDOMEN:  Obese, soft, nontender, no hepatosplenomegaly.    EXTREMITIES:  Lower extremities, no edema.  Peripheral pulses palpable, bilaterally symmetrical.    SKIN:  Revealed no rashes.  Skin is warm and dry.    PSYCHIATRIC:  Appropriate affect.    NEUROLOGICAL:  Able to move all 4 limbs.  No focal motor deficit.  Detailed neurologic exam was not performed.    DIAGNOSTIC DATA:  EKGs were reviewed and given above in HPI.  Potassium is 3.2, BUN is 13, creatinine is 0.39, total cholesterol 284, LDL 208, HDL 56.  Troponin I is elevated at 2.65, MB of 17.4 and total CK of 167.  Echocardiogram is pending.  Chest x-ray is reported by radiology to have no acute findings, lung fields clear and normal heart size.    ASSESSMENT:  1.  Acute  coronary syndrome with the patient, ruling in for a non-ST elevation MI.  Her EKG is concerning for ischemia.  She has  had recurrence of symptoms as well.  2.  Obesity.  3.  Hypertension.  4.  Dyslipidemia.    PLAN:  The patient was explained of the findings of her blood work and changes on EKG.  She is inclined towards proceeding with cardiac catheterization and further risk stratification, and we feel this is appropriate.  The patient was explained the risks, benefits and alternatives of cardiac catheterization.  Risks including, but not limited to, stroke, heart attack, death, worsening renal function, bruising, bleeding, need for blood transfusion, loss of limb, need for emergency surgery (vascular or cardiac) were discussed with the patient.  She is aware that there is no surgical backup at this facility.  However, if this is required, then she would be transferred to the appropriate Surgical Center.    We will intensify medical therapy with adding IV heparin and IV Integrilin to her regimen.  She will continue aspirin.  We are increasing her statins to moderate dose.  She will remain on beta blockers and Cozaar.  She will also be placed on low-dose nitro drip titrated for her symptoms.  We anticipate doing cardiac catheterization tomorrow morning.  If her symptoms worsen overnight then the on-call cardiologist will be available to evaluate her again.      The patient signed informed consent.  Her daughter also understood the risks of the procedure.    Thank you, Dr. Jonette Pesa, for allowing [place] to participate in the care of this patient.  If further questions arise, do not hesitate to contact our services.      Rondell Reams, MD      [id number]; D: 12/17/2012 16:08:30; T: 12/17/2012 22:39:58    cc: Sid Falcon MD      [facility] [address], [address]      [place], [place] [address]

## 2012-12-17 NOTE — Care Management Notes (Signed)
PER CARDIO'S. PLAN, PT. MOVED FROM TELE TO ICU BED AS A "STEPDOWN" STATUS IN  PREP OF TOMORROW'S CARDIAC CATH. STARTED ON INTEGRILIN, HEPARIN AND NITRO GTTS. DEFER D/C PLANNING CONVERSATION TIL AFTER CATH.

## 2012-12-17 NOTE — Progress Notes (Signed)
 [facility]  [place], [place] [address]    IP PROGRESS NOTE      [name],[name]  Date of Admission:  12/16/2012  Date of Birth:  February 06, 1951  Date of Service:  12/17/2012    Chief Complaint: I feel fine.   Subjective: No CP, SOB.  No other complaints.  When can I go home?    Vital Signs:  Temp (24hrs) Max:37.1 C (98.8 F)      Temperature: 37.1 C (98.8 F)  BP (Non-Invasive): 121/83 mmHg  MAP (Non-Invasive): 91 mmHG  Heart Rate: 79  Respiratory Rate: 18  Pain Score (Numeric, Faces): 0  SpO2-1: 96 %    Current Medications:    Current Facility-Administered Medications:  aspirin  tablet 325 mg 325 mg Oral Daily   citalopram  (CELEXA ) tablet 40 mg Oral Daily   enoxaparin  (LOVENOX ) 80 mg/0.8 mL SubQ injection 80 mg Subcutaneous 2x/day   hydrochlorothiazide  (HYDRODIURIL ) tablet 25 mg Oral Daily   losartan  (COZAAR ) tablet 25 mg Oral Daily   metoprolol  tartrate (LOPRESSOR ) tablet 100 mg Oral 2x/day   NS flush syringe 10 mL Intravenous Q8H   NS flush syringe 10 mL Intravenous Q8HRS   rosuvastatin  (CRESTOR ) tablet 10 mg Oral QPM       Today's Physical Exam:  General: appears in good health. No distress.   Eyes: Pupils equal and round, reactive to light and accomodation.   HENT:Head atraumatic and normocephalic   Neck: No JVD or thyromegaly or lymphadenopathy   Lungs: Clear to auscultation bilaterally.   Cardiovascular: regular rate and rhythm, S1, S2 normal, no murmur,   Abdomen: Soft, non-tender, Bowel sounds normal, No hepatosplenomegaly   Extremities: extremities normal, atraumatic, no cyanosis or edema   Skin: Skin warm and dry   Neurologic: Grossly normal   Lymphatics: No lymphadenopathy   Psychiatric: Normal affect, behavior,         I/O:  I/O last 24 hours:    Intake/Output Summary (Last 24 hours) at 12/17/12 1423  Last data filed at 12/17/12 1415   Gross per 24 hour   Intake    500 ml   Output    400 ml   Net    100 ml     I/O current shift:  06/24 0800 - 06/24 1559  In: 500 [P.O.:500]  Out: -        Labs  Please indicate ordered or reviewed)  Reviewed:   Lab Results for Last 24 Hours:    Results for orders placed during the hospital encounter of 12/16/12 (from the past 24 hour(s))   CREATINE KINASE (CK), TOTAL, SERUM       Result Value Range    CREATINE KINASE (CK) 167  0 - 230 IU/L   CREATINE KINASE (CK), MB FRACTION, SERUM       Result Value Range    CK-MB 17.4 (*) 0.0 - 6.3 ng/mL   TROPONIN-I       Result Value Range    TROPONIN-I 2.65 (*) 0.00 - 0.06 ng/mL   CREATINE KINASE (CK), TOTAL, SERUM       Result Value Range    CREATINE KINASE (CK) 168  0 - 230 IU/L   TROPONIN-I       Result Value Range    TROPONIN-I 1.65 (*) 0.00 - 0.06 ng/mL   CREATINE KINASE (CK), TOTAL, SERUM       Result Value Range    CREATINE KINASE (CK) 166  0 - 230 IU/L   CREATINE KINASE (  CK), MB FRACTION, SERUM       Result Value Range    CK-MB 15.4 (*) 0.0 - 6.3 ng/mL   TROPONIN-I       Result Value Range    TROPONIN-I 0.99 (*) 0.00 - 0.06 ng/mL   CBC       Result Value Range    WBC 8.9  4.0 - 11.0 K/uL    RBC 5.01  4.00 - 5.10 M/uL    HGB 12.5  12.0 - 15.5 g/dL    HCT 59.4  63.9 - 54.9 %    MCV 80.9 (*) 82.0 - 97.0 fL    MCH 24.9 (*) 28.0 - 34.0 pg    MCHC 30.7 (*) 33.0 - 37.0 g/dL    RDW 85.8 (*) 88.9 - 13.0 %    PLATELET COUNT 343  150 - 400 K/uL    MPV 8.4  7.0 - 9.4 fL    PMN % 64.5  43.0 - 76.0 %    LYMPHOCYTE % 24.4  15.0 - 43.0 %    MONOCYTE % 7.3  4.8 - 12.0 %    EOSINOPHIL % 3.1  0.0 - 5.2 %    BASOPHILS % 0.6  0.0 - 1.4 %    PMN # 5.75  1.50 - 6.50 K/uL    LYMPHOCYTE # 2.18  0.70 - 3.20 K/uL    MONOCYTE # 0.65  0.20 - 0.90 K/uL    EOSINOPHIL # 0.28  0.00 - 0.50 K/uL    BASOPHIL # 0.05  0.00 - 0.10 K/uL   BASIC METABOLIC PROFILE - [facility] ONLY       Result Value Range    GLUCOSE 106  70 - 110 mg/dL    BUN 13  6 - 22 mg/dL    CREATININE 9.26  9.46 - 1.00 mg/dL    ESTIMATED GLOMERULAR FILTRATION RATE >60  >60 ml/min    SODIUM 139  136 - 145 mmol/L    POTASSIUM 3.2 (*) 3.5 - 5.0 mmol/L    CHLORIDE 102  101 - 111 mmol/L     CARBON DIOXIDE 33 (*) 22 - 32 mmol/L    CALCIUM  9.4  8.5 - 10.5 mg/dL   LIPID PANEL       Result Value Range    CHOLESTEROL 284 (*) 120 - 199 mg/dL    TRIGLYCERIDES 898  <849 mg/dL    HDL-CHOLESTEROL 56  >39 mg/dL    LDL (CALCULATED) 791 (*) <130 mg/dL    VLDL (CALCULATED) 20  5 - 35 mg/dL    CHOL/HDL RATIO 5.1         Radiology Tests (Please indicate ordered or reviewed)  Reviewed: N/A    Problem List:  Active Hospital Problems   (*Primary Problem)    Diagnosis   . *NSTEMI (non-ST elevated myocardial infarction)   . HTN (hypertension)   . Coronary artery disease       Assessment/ Plan:   1. NSTEMI -  Cont ASA, Crestor , metoprolol , full dose Lovenox  bid. Cardiology consulted. Echo pending.  2. HTN - controlled c/w metoprolol  , HCTZ   3. Anxiety - c/w celexa    4. Hypokalemia. Replace potassium.  DVT/PE Prophylaxis: Lovenox 

## 2012-12-17 NOTE — Nurses Notes (Signed)
 Patient transferred to ICU 4 via wheelchair.

## 2012-12-17 NOTE — Consults (Signed)
[facility]    12/17/2012    The patient was interviewed and examined on rounds.    The consult note was dictated.      Pease see dictation under transcription or Consult tab for details and recommendations    Impression:  Non STEMI/ ACS  Recurrent chest pain/ pressure  HTN  Hlp  Obesity  Known CAD    Plan:  Cardiac cath ; possible revascularization.  Echo  Intensify medical therapy with IV integrelin, IV heparin, ASA, beta blockers, statins and ACE-I  Change to step down status    Risks, benefits, alternative of catheterization were discussed with the patient and risks including but not limited to stroke, heart attack, death, bleeding, bruising, need for emergency surgery, renal dysfunction, pain, discomfort, allergic reactions, need for blood transfusions, additional imaging, loss of limb, need for emergent transfer to tertiary care center etc were discussed with the patient at length. Ample opportunity to ask questions was provided and all were answered in detail. The patient verbalized an understanding of the procedure, risks and alternatives available. The patient understands that there is no cardiovascular surgical backup available at this facility. Informed consent was signed and placed on the chart.     For moderate sedation ASA-3, Mallampati    Further recs after cardiac imaging completed    Thank you for the consult.      Rondell Reams, MD, [facility], [facility]   [facility]

## 2012-12-18 ENCOUNTER — Inpatient Hospital Stay (HOSPITAL_BASED_OUTPATIENT_CLINIC_OR_DEPARTMENT_OTHER): Payer: BC Managed Care – PPO

## 2012-12-18 LAB — ZZAPTT, THERAPUTIC
HEPARIN DOSE: 1060
HEPARIN DOSE: 760
HEPARIN-LAST DOSE DATE: 20140624
THERAPEUTIC APTT: >153.4 s (ref 45.6–74.8)

## 2012-12-18 LAB — CBC
BASOPHIL #: 0.05 10*3/uL (ref 0.00–0.10)
BASOPHILS %: 0.4 % (ref 0.0–1.4)
EOSINOPHIL #: 0.39 10*3/uL (ref 0.00–0.50)
EOSINOPHIL %: 3.4 % (ref 0.0–5.2)
HCT: 38.9 % (ref 36.0–45.0)
HGB: 13.3 g/dL (ref 12.0–15.5)
LYMPHOCYTE #: 3.31 K/uL — ABNORMAL HIGH (ref 0.70–3.20)
LYMPHOCYTE %: 28.6 % (ref 15.0–43.0)
MCH: 27.1 pg — ABNORMAL LOW (ref 28.0–34.0)
MCHC: 34.1 g/dL (ref 33.0–37.0)
MCV: 79.6 fL — ABNORMAL LOW (ref 82.0–97.0)
MONOCYTE #: 0.74 10*3/uL (ref 0.20–0.90)
MONOCYTE %: 6.4 % (ref 4.8–12.0)
MPV: 7.5 fL (ref 7.0–9.4)
PLATELET COUNT: 353 K/uL (ref 150–400)
PMN #: 7.09 K/uL — ABNORMAL HIGH (ref 1.50–6.50)
PMN %: 61.2 % (ref 43.0–76.0)
RBC: 4.89 M/uL (ref 4.00–5.10)
RDW: 13.9 % — ABNORMAL HIGH (ref 11.0–13.0)
WBC: 11.6 10*3/uL — ABNORMAL HIGH (ref 4.0–11.0)

## 2012-12-18 LAB — APTT,THERAPEUTIC
HEPARIN DOSE: 1060
HEPARIN-LAST DOSE DATE: 20140624
HEPARIN-LAST DOSE DATE: 20140625
THERAPEUTIC APTT: >153.4 s (ref 45.6–74.8)
THERAPEUTIC APTT: 93.5 s (ref 45.6–74.8)
TIME HEPARIN: 1944
TIME HEPARIN: 1901
TIME HEPARIN: 330

## 2012-12-18 LAB — BASIC METABOLIC PROFILE - BMC/JMC ONLY
BUN: 18 mg/dL (ref 6–22)
CALCIUM: 9.2 mg/dL (ref 8.5–10.5)
CARBON DIOXIDE: 32 mmol/L (ref 22–32)
CHLORIDE: 102 mmol/L (ref 101–111)
CREATININE: 1.18 mg/dL — ABNORMAL HIGH (ref 0.53–1.00)
ESTIMATED GLOMERULAR FILTRATION RATE: 56 mL/min — ABNORMAL LOW (ref >60)
GLUCOSE: 112 mg/dL — ABNORMAL HIGH (ref 70–110)
POTASSIUM: 3.8 mmol/L (ref 3.5–5.0)
SODIUM: 140 mmol/L (ref 136–145)

## 2012-12-18 MED ORDER — BIVALIRUDIN 5 MG/ML BOLUS FROM INFUSION
INTRAVENOUS | Status: DC
Start: 2012-12-18 — End: 2012-12-18
  Filled 2012-12-18: qty 1

## 2012-12-18 MED ORDER — DEXTROSE 5 % IN WATER (D5W) INTRAVENOUS SOLUTION
1.7500 mg/kg/h | INTRAVENOUS | Status: DC
Start: 2012-12-18 — End: 2012-12-18
  Administered 2012-12-18: 0 mg/kg/h via INTRAVENOUS
  Administered 2012-12-18 (×2): 1.75 mg/kg/h via INTRAVENOUS

## 2012-12-18 MED ORDER — NITROGLYCERIN 50 MG/250 ML (200 MCG/ML) IN 5 % DEXTROSE INTRAVENOUS
10.00 ug/min | INTRAVENOUS | Status: DC
Start: 2012-12-18 — End: 2012-12-19
  Administered 2012-12-18: 10 ug/min via INTRAVENOUS
  Administered 2012-12-18: 15 ug/min via INTRAVENOUS
  Administered 2012-12-18: 20 ug/min via INTRAVENOUS
  Administered 2012-12-18: 10 ug/min via INTRAVENOUS
  Administered 2012-12-18: 5 ug/min via INTRAVENOUS
  Administered 2012-12-18: 10 ug/min via INTRAVENOUS
  Administered 2012-12-19: 0 ug/min via INTRAVENOUS

## 2012-12-18 MED ORDER — ACETAMINOPHEN 325 MG TABLET
ORAL_TABLET | ORAL | Status: AC
Start: 2012-12-18 — End: 2012-12-18
  Administered 2012-12-18: 650 mg via ORAL
  Filled 2012-12-18: qty 2

## 2012-12-18 MED ORDER — ONDANSETRON HCL (PF) 4 MG/2 ML INJECTION SOLUTION
4.00 mg | Freq: Four times a day (QID) | INTRAMUSCULAR | Status: DC | PRN
Start: 2012-12-18 — End: 2012-12-20
  Administered 2012-12-18: 4 mg via INTRAVENOUS
  Filled 2012-12-18: qty 2

## 2012-12-18 MED ORDER — HEPARIN (PORCINE) (PF) 2,000 UNIT/1,000 ML IN 0.9 % SODIUM CHLORIDE IV
INTRAVENOUS | Status: DC
Start: 2012-12-18 — End: 2012-12-18
  Administered 2012-12-18: 1 mL
  Administered 2012-12-18: 0
  Filled 2012-12-18: qty 2000

## 2012-12-18 MED ORDER — HEPARIN (PORCINE) (PF) 1,000 UNIT/500 ML IN 0.9 % SODIUM CHLORIDE IV
INTRAVENOUS | Status: DC
Start: 2012-12-18 — End: 2012-12-18
  Administered 2012-12-18: 0 mL/h
  Administered 2012-12-18: 3 mL/h
  Filled 2012-12-18: qty 500

## 2012-12-18 MED ORDER — CLOPIDOGREL 300 MG TABLET
600.00 mg | ORAL_TABLET | ORAL | Status: AC
Start: 2012-12-18 — End: 2012-12-18
  Administered 2012-12-18: 600 mg via ORAL
  Filled 2012-12-18: qty 2

## 2012-12-18 MED ORDER — HYDRALAZINE 20 MG/ML INJECTION SOLUTION
INTRAMUSCULAR | Status: AC
Start: 2012-12-18 — End: 2012-12-18
  Administered 2012-12-18: 10 mg via INTRAVENOUS
  Filled 2012-12-18: qty 1

## 2012-12-18 MED ORDER — BIVALIRUDIN 5 MG/ML BOLUS FROM INFUSION
0.75 mg/kg | INTRAVENOUS | Status: AC
Start: 2012-12-18 — End: 2012-12-18

## 2012-12-18 MED ORDER — FENTANYL (PF) 50 MCG/ML INJECTION SOLUTION
25.00 ug | INTRAMUSCULAR | Status: DC | PRN
Start: 2012-12-18 — End: 2012-12-18

## 2012-12-18 MED ORDER — NITROGLYCERIN 50 MCG/ML IN D5W INJECTION - EAST
200.00 ug | INJECTION | INTRAVENOUS | Status: AC
Start: 2012-12-18 — End: 2012-12-18
  Administered 2012-12-18: 150 ug via INTRACORONARY
  Filled 2012-12-18: qty 30

## 2012-12-18 MED ORDER — MIDAZOLAM 1 MG/ML INJECTION SOLUTION
1.00 mg | INTRAMUSCULAR | Status: AC | PRN
Start: 2012-12-18 — End: 2012-12-18
  Administered 2012-12-18 (×4): 1 mg via INTRAVENOUS
  Filled 2012-12-18 (×2): qty 2

## 2012-12-18 MED ORDER — ACETAMINOPHEN 325 MG TABLET
650.00 mg | ORAL_TABLET | ORAL | Status: DC | PRN
Start: 2012-12-18 — End: 2012-12-20

## 2012-12-18 MED ORDER — LIDOCAINE (PF) 10 MG/ML (1 %) INJECTION SOLUTION
2.00 mL | INTRAMUSCULAR | Status: AC
Start: 2012-12-18 — End: 2012-12-18
  Administered 2012-12-18: 19 mL via INTRADERMAL
  Filled 2012-12-18 (×2): qty 30

## 2012-12-18 MED ORDER — EPTIFIBATIDE 0.75 MG/ML INTRAVENOUS SOLUTION
2.0000 ug/kg/min | INTRAVENOUS | Status: AC
Start: 2012-12-18 — End: 2012-12-18
  Administered 2012-12-18: 2 ug/kg/min via INTRAVENOUS
  Administered 2012-12-18: 0 ug/kg/min via INTRAVENOUS
  Filled 2012-12-18: qty 100

## 2012-12-18 MED ORDER — SODIUM CHLORIDE 0.9 % INTRAVENOUS SOLUTION
INTRAVENOUS | Status: AC
Start: 2012-12-18 — End: 2012-12-18
  Administered 2012-12-19: 0 via INTRAVENOUS

## 2012-12-18 MED ORDER — MORPHINE 2 MG/ML INJECTION SYRINGE
2.00 mg | INJECTION | INTRAMUSCULAR | Status: DC
Start: 2012-12-18 — End: 2012-12-18
  Administered 2012-12-18 (×3): 2 mg via INTRAVENOUS

## 2012-12-18 MED ORDER — FENTANYL (PF) 50 MCG/ML INJECTION SOLUTION
INTRAMUSCULAR | Status: AC
Start: 2012-12-18 — End: 2012-12-18
  Administered 2012-12-18: 25 ug via INTRAVENOUS
  Filled 2012-12-18: qty 2

## 2012-12-18 MED ORDER — IODIXANOL 320 MG IODINE/ML INTRAVENOUS SOLUTION
95.00 mL | INTRAVENOUS | Status: AC
Start: 2012-12-18 — End: 2012-12-18
  Administered 2012-12-18: 189 mL via INTRACORONARY
  Filled 2012-12-18: qty 300
  Filled 2012-12-18: qty 200

## 2012-12-18 MED ORDER — HYDRALAZINE 20 MG/ML INJECTION SOLUTION
10.00 mg | INTRAMUSCULAR | Status: DC
Start: 2012-12-18 — End: 2012-12-18

## 2012-12-18 MED ORDER — CLOPIDOGREL 75 MG TABLET
75.0000 mg | ORAL_TABLET | Freq: Every day | ORAL | Status: DC
Start: 2012-12-19 — End: 2012-12-20
  Administered 2012-12-19 – 2012-12-20 (×2): 75 mg via ORAL
  Filled 2012-12-18 (×2): qty 1

## 2012-12-18 MED ADMIN — bivalirudin 250 mg/50 mL (5 mg/mL) intravenous solution: 66.1 mg | INTRAVENOUS | NDC 09997000032

## 2012-12-18 MED ADMIN — sodium chloride 0.9 % (flush) injection syringe: 0 mL | INTRAVENOUS

## 2012-12-18 MED ADMIN — sodium chloride 0.9 % (flush) injection syringe: 10 mL | INTRAVENOUS | NDC 08290306546

## 2012-12-18 MED ADMIN — losartan 25 mg tablet: 25 mg | ORAL | NDC 68084034611

## 2012-12-18 MED FILL — bivalirudin 250 mg/50 mL (5 mg/mL) intravenous solution: 0.7500 mg/kg | INTRAVENOUS | Qty: 1 | Status: AC

## 2012-12-18 MED FILL — morphine 10 mg/mL injection syringe: INTRAMUSCULAR | Qty: 1 | Status: AC

## 2012-12-18 NOTE — Care Plan (Signed)
 Problem: General Plan of Care(Adult,OB)  Goal: Plan of Care Review(Adult,OB)  The patient and/or their representative will communicate an understanding of their plan of care   Outcome: Ongoing (see interventions/notes)  SHIFT SUMMARY 7A-7P:  HEPARIN  GTT WAS STOPPED THIS AM PER CARDIAC HEPARIN  PROTOCOL AND WHILE GTT WAS STOPPED CATH LAB TEAM UP TO GET PT. PT HAD CARDIAC CATH THIS AM, NOTED SMALL AMT OF VAGINAL BLEEDING PER PT WITH WIPING UPON VOIDING, SCANT AMT OF BLOOD NOTED ON BED PAD. CATH LAB TEAM MADE AWARE SINCE OCCURRED JUST PRIOR TO CATH. PT CAME BACK FROM CATH PROCEDURE WITH HEPARIN  GTT D/C'ED. INTERGRILIN GTT TO CONTINUE UNTIL 7PM PER ORDERS. PT GIVEN LOADING DOSE OF PLAVIX  BY CATH LAB TEAM POST CATH. PT HAD AN EPISODE OF CHEST PRESSURE AT 1620, INCREASED NTG GTT TO WITH COMPLETE PAIN RELIEF NOTED WITHIN 5 MINUTES-EKG DONE WITHOUT CHANGES NOTED. DR. JALISI MADE AWARE, ADVISED TO LEAVE NTG GTT @ . RT GROIN SITE SOFT, DRESSING INTACT, WITHOUT DRNG NOTED.  [name] Dayzha Pogosyan, RN

## 2012-12-18 NOTE — Progress Notes (Signed)
[facility]  [place], [place] [address]    IP PROGRESS NOTE      [name],[name]  Date of Admission:  12/16/2012  Date of Birth:  [birth year]  Date of Service:  12/18/2012    Chief Complaint: my gums were bleeding last night.   It is better now.   Subjective: No CP, SOB.  No new complaints.      Vital Signs:  Temp (24hrs) Max:37.1 C (98.8 F)      Temperature: 37 C (98.6 F)  BP (Non-Invasive): 123/67 mmHg  MAP (Non-Invasive): 82 mmHG  Heart Rate: 80  Respiratory Rate: 16  Pain Score (Numeric, Faces): 0  SpO2-1: 99 %    Current Medications:    Current Facility-Administered Medications:  aspirin chewable tablet 81 mg 81 mg Oral Daily   bivalirudin (ANGIOMAX) 250 mg in D5W 50 mL infusion 1.75 mg/kg/hr Intravenous [facility] Continuous   bivalirudin (ANGIOMAX) injection ---Cabinet Override      citalopram (CELEXA) tablet 40 mg Oral Daily   eptifibatide (INTEGRILIN) 75 mg in 100 mL premix infusion 2 mcg/kg/min Intravenous Continuous   fentaNYL (PF) (SUBLIMAZE) injection ---Cabinet Override      fentaNYL (SUBLIMAZE) 50 mcg/mL injection 25 mcg Intravenous [facility] Q5 Min PRN   heparin 1,000 units in NS 500 mL premix flush  Other [facility] Continuous   heparin 2,000 units in NS 1 L irrigation  Other [facility] Continuous   heparin 25,000 units in D5W 500 mL premix infusion 12 Units/kg/hr Intravenous Continuous   heparin 5,000 units/mL injection IV bolus 0-4,000 Units Intravenous Q6H PRN   hydrALAZINE (APRESOLINE) injection 10 mg 10 mg Intravenous Give in [facility]   hydrochlorothiazide (HYDRODIURIL) tablet 25 mg Oral Daily   iodixanol (VISIPAQUE 320) infusion 95 mL Intracoronary Give in [facility]   lidocaine PF (XYLOCAINE-MPF) 1% injection 2-20 mL Intradermal Give in [facility]   losartan (COZAAR) tablet 25 mg Oral Daily   metoprolol tartrate (LOPRESSOR) tablet 100 mg Oral 2x/day   midazolam (VERSED) 1 mg/mL injection 1 mg Intravenous [facility] Q5 Min PRN    morphine 2 mg/mL injection 2 mg Intravenous Give in [facility]   morphine 4 mg/mL injection 4 mg Intravenous Q3H PRN   morphine injection ---Cabinet Override      nitroglycerin (NITROSTAT) sublingual tablet 0.4 mg Sublingual Q5 Min PRN   nitroglycerin 50 mcg/mL in D5W injection 200 mcg Intracoronary Give in [facility]   nitroglycerin 50 mg in 250 ml D5W premix infusion 2.5 mcg/min Intravenous Continuous   NS flush syringe 10 mL Intravenous Q8HRS   NS flush syringe 10 mL Intravenous Q8HRS   NS premix infusion  Intravenous Continuous   rosuvastatin (CRESTOR) tablet 20 mg Oral QPM       Today's Physical Exam:  General: appears in good health. No distress.   Eyes: Pupils equal and round, reactive to light and accomodation.   HENT:Head atraumatic and normocephalic   Neck: No JVD or thyromegaly or lymphadenopathy   Lungs: Clear to auscultation bilaterally.   Cardiovascular: regular rate and rhythm, S1, S2 normal, no murmur,   Abdomen: Soft, non-tender, Bowel sounds normal, No hepatosplenomegaly   Extremities: extremities normal, atraumatic, no cyanosis or edema   Skin: Skin warm and dry   Neurologic: Grossly normal   Lymphatics: No lymphadenopathy   Psychiatric: Normal affect, behavior,       I/O:  I/O last 24 hours:  Intake/Output Summary (Last 24 hours) at 12/18/12 1301  Last data filed at 12/18/12 1100   Gross per 24 hour   Intake 1242.2 ml   Output      0 ml   Net 1242.2 ml     I/O current shift:  06/25 0800 - 06/25 1559  In: 442.2 [I.V.:442.2]  Out: -       Labs  Please indicate ordered or reviewed)  Reviewed:   Lab Results for Last 24 Hours:    Results for orders placed during the hospital encounter of 12/16/12 (from the past 24 hour(s))   APTT,THERAPEUTIC - [facility] ONLY       Result Value Range    THERAPEUTIC APTT 48.8  45.6 - 74.8 sec   CBC       Result Value Range    WBC 9.8  4.0 - 11.0 K/uL    RBC 4.85  4.00 - 5.10 M/uL    HGB 12.5  12.0 - 15.5 g/dL    HCT 56.2  13.0 - 86.5 %     MCV 79.4 (*) 82.0 - 97.0 fL    MCH 25.9 (*) 28.0 - 34.0 pg    MCHC 32.6 (*) 33.0 - 37.0 g/dL    RDW 78.4 (*) 69.6 - 13.0 %    PLATELET COUNT 340  150 - 400 K/uL    MPV 7.8  7.0 - 9.4 fL    PMN % 64.1  43.0 - 76.0 %    LYMPHOCYTE % 24.9  15.0 - 43.0 %    MONOCYTE % 7.2  4.8 - 12.0 %    EOSINOPHIL % 3.1  0.0 - 5.2 %    BASOPHILS % 0.7  0.0 - 1.4 %    PMN # 6.26  1.50 - 6.50 K/uL    LYMPHOCYTE # 2.43  0.70 - 3.20 K/uL    MONOCYTE # 0.70  0.20 - 0.90 K/uL    EOSINOPHIL # 0.30  0.00 - 0.50 K/uL    BASOPHIL # 0.07  0.00 - 0.10 K/uL   MAGNESIUM       Result Value Range    MAGNESIUM 2.0  1.7 - 2.5 mg/dL   APTT,THERAPEUTIC - [facility] ONLY       Result Value Range    THERAPEUTIC APTT >153.4 (*) 45.6 - 74.8 sec    HEPARIN DOSE 1060      HEPARIN-LAST DOSE DATE [id number]      TIME HEPARIN 1944     BASIC METABOLIC PROFILE - [facility] ONLY       Result Value Range    GLUCOSE 112 (*) 70 - 110 mg/dL    BUN 18  6 - 22 mg/dL    CREATININE 2.44 (*) 0.53 - 1.00 mg/dL    ESTIMATED GLOMERULAR FILTRATION RATE 56 (*) >60 ml/min    SODIUM 140  136 - 145 mmol/L    POTASSIUM 3.8  3.5 - 5.0 mmol/L    CHLORIDE 102  101 - 111 mmol/L    CARBON DIOXIDE 32  22 - 32 mmol/L    CALCIUM 9.2  8.5 - 10.5 mg/dL   CBC       Result Value Range    WBC 11.6 (*) 4.0 - 11.0 K/uL    RBC 4.89  4.00 - 5.10 M/uL    HGB 13.3  12.0 - 15.5 g/dL    HCT 01.0  27.2 - 53.6 %    MCV 79.6 (*) 82.0 - 97.0 fL    MCH  27.1 (*) 28.0 - 34.0 pg    MCHC 34.1  33.0 - 37.0 g/dL    RDW 13.2 (*) 44.0 - 13.0 %    PLATELET COUNT 353  150 - 400 K/uL    MPV 7.5  7.0 - 9.4 fL    PMN % 61.2  43.0 - 76.0 %    LYMPHOCYTE % 28.6  15.0 - 43.0 %    MONOCYTE % 6.4  4.8 - 12.0 %    EOSINOPHIL % 3.4  0.0 - 5.2 %    BASOPHILS % 0.4  0.0 - 1.4 %    PMN # 7.09 (*) 1.50 - 6.50 K/uL    LYMPHOCYTE # 3.31 (*) 0.70 - 3.20 K/uL    MONOCYTE # 0.74  0.20 - 0.90 K/uL    EOSINOPHIL # 0.39  0.00 - 0.50 K/uL    BASOPHIL # 0.05  0.00 - 0.10 K/uL   APTT,THERAPEUTIC - [facility] ONLY       Result Value Range     THERAPEUTIC APTT >153.4 (*) 45.6 - 74.8 sec    HEPARIN DOSE 1060      HEPARIN-LAST DOSE DATE [id number]      TIME HEPARIN 1901     APTT,THERAPEUTIC - [facility] ONLY       Result Value Range    THERAPEUTIC APTT 93.5 (*) 45.6 - 74.8 sec    HEPARIN DOSE 760 UNITS/HR      HEPARIN-LAST DOSE DATE [id number]      TIME HEPARIN 0330         Radiology Tests (Please indicate ordered or reviewed)  Reviewed: N/A    Problem List:  Active Hospital Problems   (*Primary Problem)    Diagnosis   . *NSTEMI (non-ST elevated myocardial infarction)   . HTN (hypertension)   . Coronary artery disease       Assessment/ Plan:   1. NSTEMI -  Cont ASA, Crestor, metoprolol, full dose Lovenox bid. Cardiology consult noted. On heparin and integilin gtt. Cardiac cath today.  2. HTN - controlled c/w metoprolol , HCTZ, cozaar.  3. Anxiety -  Cont celexa.  4. Hypokalemia. Resolved.  DVT/PE Prophylaxis: heparin gtt.  Will follow.

## 2012-12-18 NOTE — Nurses Notes (Signed)
Pt OOB to bedside commode with no adverse S/S. Pt groin site remains C/D/I.

## 2012-12-18 NOTE — Nurses Notes (Signed)
 Client brought to cath lab via ICU bed by other cath lab staff. Client denies c/p. Prepped in ICU for cath. DP and PT pulses 2+. Awaiting in cath lab holding for heart cath. Jerel Cleotilde Alert RN, BSN, RCIS

## 2012-12-18 NOTE — Nurses Notes (Signed)
High ptt of >153.4 called by lab.  Heparin gtt stopped at this time. Lab called to redraw ptt to verify result. Manfred Arch, RN

## 2012-12-18 NOTE — Nurses Notes (Signed)
Pt B/P minimal- Pt remains having no C/O CP. Nitro titrated to order.

## 2012-12-18 NOTE — Progress Notes (Signed)
[name]    Date of [name]: 12/18/2012    PCP: Sid Falcon, MD    [name]  [id number]    Preliminary Cardiac Cath  Report:    This is only a preliminary cath report. Please refer to final cath report for official findings.    Pre op Diagnosis:  Non STEMI    Procedure : LHC, LV ram, coronary angiography    Complications: none    Closure Device : Yes - 62F VIP angioseal    Hemodynamics: Please see cath lab log for details.  Sig Aortic stenosis No    LV gram: 45 %; apex, distal/ apical anterior wall and apical inferior wall hypokinetic    Mitral Regurgitaton: No    Coronary angiography:  LM: Normal; Short  LAD: mid 99%; TIMI-1 flow pre intervention;   After intervention 0% residual ds in stent and TIMI-3 flow restored.  Dx-2 branch proximal 30%  VWU:[id number] vessel; non obstructive  RCA: proximal 30%; non obstructive; dominant    Impression: Successful PTCA and stent to mid LAD with a 3x77mm Promus stent    Recommendation:  ASA + Plavix  Medical rx for CAD and MI  Lifestyle and risk factor modifications      Please review full Report Dictated separately for official results.      Rondell Reams, MD, [facility], FSCAI

## 2012-12-18 NOTE — Ancillary Notes (Signed)
[facility]  [place], [place] [address]    Medical Nutrition Therapy Assessment      Reason for Assessment: NPO x 3.  Priority level 2. Follow in 5-6 days.    SUBJECTIVE :   Do not need supplements.    OBJECTIVE:    Labs:   I have reviewed all lab results.  Lab Results for Last 24 Hours:    Results for orders placed during the hospital encounter of 12/16/12 (from the past 24 hour(s))   APTT,THERAPEUTIC - [facility] ONLY       Result Value Range    THERAPEUTIC APTT 48.8  45.6 - 74.8 sec   CBC       Result Value Range    WBC 9.8  4.0 - 11.0 K/uL    RBC 4.85  4.00 - 5.10 M/uL    HGB 12.5  12.0 - 15.5 g/dL    HCT 82.9  56.2 - 13.0 %    MCV 79.4 (*) 82.0 - 97.0 fL    MCH 25.9 (*) 28.0 - 34.0 pg    MCHC 32.6 (*) 33.0 - 37.0 g/dL    RDW 86.5 (*) 78.4 - 13.0 %    PLATELET COUNT 340  150 - 400 K/uL    MPV 7.8  7.0 - 9.4 fL    PMN % 64.1  43.0 - 76.0 %    LYMPHOCYTE % 24.9  15.0 - 43.0 %    MONOCYTE % 7.2  4.8 - 12.0 %    EOSINOPHIL % 3.1  0.0 - 5.2 %    BASOPHILS % 0.7  0.0 - 1.4 %    PMN # 6.26  1.50 - 6.50 K/uL    LYMPHOCYTE # 2.43  0.70 - 3.20 K/uL    MONOCYTE # 0.70  0.20 - 0.90 K/uL    EOSINOPHIL # 0.30  0.00 - 0.50 K/uL    BASOPHIL # 0.07  0.00 - 0.10 K/uL   MAGNESIUM       Result Value Range    MAGNESIUM 2.0  1.7 - 2.5 mg/dL   APTT,THERAPEUTIC - [facility] ONLY       Result Value Range    THERAPEUTIC APTT >153.4 (*) 45.6 - 74.8 sec    HEPARIN DOSE 1060      HEPARIN-LAST DOSE DATE [id number]      TIME HEPARIN 1944     BASIC METABOLIC PROFILE - [facility] ONLY       Result Value Range    GLUCOSE 112 (*) 70 - 110 mg/dL    BUN 18  6 - 22 mg/dL    CREATININE 4.13 (*) 0.53 - 1.00 mg/dL    ESTIMATED GLOMERULAR FILTRATION RATE 56 (*) >60 ml/min    SODIUM 140  136 - 145 mmol/L    POTASSIUM 3.8  3.5 - 5.0 mmol/L    CHLORIDE 102  101 - 111 mmol/L    CARBON DIOXIDE 32  22 - 32 mmol/L    CALCIUM 9.2  8.5 - 10.5 mg/dL   CBC       Result Value Range    WBC 11.6 (*) 4.0 - 11.0 K/uL    RBC 4.89  4.00 - 5.10 M/uL     HGB 13.3  12.0 - 15.5 g/dL    HCT 24.4  01.0 - 27.2 %    MCV 79.6 (*) 82.0 - 97.0 fL    MCH 27.1 (*) 28.0 - 34.0 pg    MCHC 34.1  33.0 -  37.0 g/dL    RDW 82.9 (*) 56.2 - 13.0 %    PLATELET COUNT 353  150 - 400 K/uL    MPV 7.5  7.0 - 9.4 fL    PMN % 61.2  43.0 - 76.0 %    LYMPHOCYTE % 28.6  15.0 - 43.0 %    MONOCYTE % 6.4  4.8 - 12.0 %    EOSINOPHIL % 3.4  0.0 - 5.2 %    BASOPHILS % 0.4  0.0 - 1.4 %    PMN # 7.09 (*) 1.50 - 6.50 K/uL    LYMPHOCYTE # 3.31 (*) 0.70 - 3.20 K/uL    MONOCYTE # 0.74  0.20 - 0.90 K/uL    EOSINOPHIL # 0.39  0.00 - 0.50 K/uL    BASOPHIL # 0.05  0.00 - 0.10 K/uL   APTT,THERAPEUTIC - [facility] ONLY       Result Value Range    THERAPEUTIC APTT >153.4 (*) 45.6 - 74.8 sec    HEPARIN DOSE 1060      HEPARIN-LAST DOSE DATE [id number]      TIME HEPARIN 1901     APTT,THERAPEUTIC - [facility] ONLY       Result Value Range    THERAPEUTIC APTT 93.5 (*) 45.6 - 74.8 sec    HEPARIN DOSE 760 UNITS/HR      HEPARIN-LAST DOSE DATE [id number]      TIME HEPARIN 0330         Meds:   Current Facility-Administered Medications:  aspirin chewable tablet 81 mg 81 mg Oral Daily   citalopram (CELEXA) tablet 40 mg Oral Daily   [START ON 12/19/2012] clopidogrel (PLAVIX) 75 mg tablet 75 mg Oral Daily   eptifibatide (INTEGRILIN) 75 mg in 100 mL premix infusion 2 mcg/kg/min Intravenous Continuous   hydrochlorothiazide (HYDRODIURIL) tablet 25 mg Oral Daily   losartan (COZAAR) tablet 25 mg Oral Daily   metoprolol tartrate (LOPRESSOR) tablet 100 mg Oral 2x/day   morphine 4 mg/mL injection 4 mg Intravenous Q3H PRN   morphine injection ---Cabinet Override      nitroglycerin (NITROSTAT) sublingual tablet 0.4 mg Sublingual Q5 Min PRN   nitroglycerin 50 mg in 250 ml D5W premix infusion 10 mcg/min Intravenous Continuous   NS flush syringe 10 mL Intravenous Q8HRS   NS flush syringe 10 mL Intravenous Q8HRS   NS premix infusion  Intravenous Continuous   NS premix infusion  Intravenous Continuous    ondansetron (ZOFRAN) 2 mg/mL injection 4 mg Intravenous Q6H PRN   rosuvastatin (CRESTOR) tablet 20 mg Oral QPM       Comments: 62 y.o. Obese female with NSTEMI; s/p Card. Cath  / stent placement today.  Pt. ate well, was NPO for the procedure. Stated goos appetite, refused supplements.  Labs noted: Chol 284, LDL 208.  Pt. needs lifestyle changes, wt. loss, cholesterol lowering diet.    PLAN / INTERVENTION        Goals:  Comprehend: diet/ nutrient needs    Monitor / Evaluate: Provide Education on: Heart healthy diet; lowering cholesterol.    Intervention : Will provide educ. when pt. Recover after today's procedure.    Nutrition Diagnosis: Overweight/Obesity ; related to Behavior/Environmental/Cultural  Food & Nutrition related knowledge deficit, Unwilling to reduce intake and Lack of prior exposure to information ; as evidenced by Abnormal Lab Values ie: Chol 284, LDL 208; \, BMI greater than 25 and Surgical Procedure Cardiac cath. with stent placement.        Ext.  # [id number]

## 2012-12-19 DIAGNOSIS — R109 Unspecified abdominal pain: Secondary | ICD-10-CM

## 2012-12-19 LAB — CBC W/ AUTOMATED DIFF - BMC ONLY
BASOPHIL #: 0.01 K/uL (ref 0.00–0.10)
BASOPHILS %: 0.1 % (ref 0.0–1.4)
EOSINOPHIL #: 0.17 10*3/uL (ref 0.00–0.50)
EOSINOPHIL %: 1.7 % (ref 0.0–5.2)
HCT: 35.9 % — ABNORMAL LOW (ref 36.0–45.0)
HGB: 11.8 g/dL — ABNORMAL LOW (ref 12.0–15.5)
LYMPHOCYTE #: 1.51 K/uL (ref 0.70–3.20)
LYMPHOCYTE %: 16 % (ref 15.0–43.0)
MCH: 26.3 pg — ABNORMAL LOW (ref 28.0–34.0)
MCHC: 32.9 g/dL — ABNORMAL LOW (ref 33.0–37.0)
MCV: 79.9 fL — ABNORMAL LOW (ref 82.0–97.0)
MONOCYTE #: 0.71 10*3/uL (ref 0.20–0.90)
MONOCYTE %: 7.5 % (ref 4.8–12.0)
MPV: 7.9 fL (ref 7.0–9.4)
PLATELET COUNT: 310 K/uL (ref 150–400)
PMN #: 7.06 K/uL — ABNORMAL HIGH (ref 1.50–6.50)
PMN %: 74.7 % (ref 43.0–76.0)
RBC: 4.49 M/uL (ref 4.00–5.10)
RDW: 14 % — ABNORMAL HIGH (ref 11.0–13.0)
WBC: 9.5 10*3/uL (ref 4.0–11.0)

## 2012-12-19 LAB — COMPREHENSIVE METABOLIC PROFILE - BMC/JMC ONLY
ALBUMIN: 3.6 g/dL (ref 3.2–5.0)
ALKALINE PHOSPHATASE: 75 IU/L (ref 35–120)
ALT (SGPT): 14 IU/L (ref 0–55)
AST (SGOT): 21 IU/L (ref 0–45)
BILIRUBIN, TOTAL: 0.8 mg/dL (ref 0.0–1.3)
BUN: 20 mg/dL (ref 6–22)
CALCIUM: 9.3 mg/dL (ref 8.5–10.5)
CARBON DIOXIDE: 29 mmol/L (ref 22–32)
CHLORIDE: 105 mmol/L (ref 101–111)
CREATININE: 0.7 mg/dL (ref 0.53–1.00)
ESTIMATED GLOMERULAR FILTRATION RATE: >60 mL/min (ref >60)
GLUCOSE: 99 mg/dL (ref 70–110)
POTASSIUM: 4 mmol/L (ref 3.5–5.0)
SODIUM: 140 mmol/L (ref 136–145)
TOTAL PROTEIN: 5.7 g/dL — ABNORMAL LOW (ref 6.0–8.0)

## 2012-12-19 LAB — MAGNESIUM: MAGNESIUM: 1.8 mg/dL (ref 1.7–2.5)

## 2012-12-19 LAB — BILIRUBIN, CONJUGATED (DIRECT): BILIRUBIN,CONJUGATED: 0.1 mg/dL (ref 0.0–0.3)

## 2012-12-19 MED ORDER — PANTOPRAZOLE 40 MG TABLET,DELAYED RELEASE
40.00 mg | DELAYED_RELEASE_TABLET | Freq: Every day | ORAL | Status: DC
Start: 2012-12-20 — End: 2012-12-20
  Administered 2012-12-20: 40 mg via ORAL
  Filled 2012-12-19: qty 1

## 2012-12-19 MED ORDER — ISOSORBIDE DINITRATE 10 MG TABLET
20.00 mg | ORAL_TABLET | Freq: Three times a day (TID) | ORAL | Status: DC
Start: 2012-12-19 — End: 2012-12-20
  Administered 2012-12-19 – 2012-12-20 (×2): 20 mg via ORAL
  Filled 2012-12-19 (×2): qty 2

## 2012-12-19 MED ORDER — OMEPRAZOLE 20 MG-SODIUM BICARBONATE 1,680 MG ORAL PACKET
20.00 mg | PACK | Freq: Every morning | ORAL | Status: DC
Start: 2012-12-20 — End: 2012-12-19

## 2012-12-19 MED ORDER — OMEPRAZOLE 20 MG-SODIUM BICARBONATE 1,680 MG ORAL PACKET
20.00 mg | PACK | Freq: Every morning | ORAL | Status: DC
Start: 2012-12-19 — End: 2012-12-19
  Administered 2012-12-19: 20 mg via ORAL
  Filled 2012-12-19 (×2): qty 1

## 2012-12-19 MED ORDER — GI COCKTAIL - EAST
55.0000 mL | Freq: Once | ORAL | Status: AC
Start: 2012-12-19 — End: 2012-12-19
  Administered 2012-12-19: 55 mL via ORAL
  Filled 2012-12-19: qty 55

## 2012-12-19 MED ORDER — RANITIDINE 150 MG TABLET
150.00 mg | ORAL_TABLET | Freq: Every evening | ORAL | Status: DC
Start: 2012-12-19 — End: 2012-12-20
  Administered 2012-12-19: 150 mg via ORAL
  Filled 2012-12-19: qty 1

## 2012-12-19 MED ADMIN — losartan 25 mg tablet: 25 mg | ORAL | NDC 68084034611

## 2012-12-19 MED ADMIN — sodium chloride 0.9 % (flush) injection syringe: 10 mL | INTRAVENOUS | NDC 08290306546

## 2012-12-19 NOTE — Nurses Notes (Signed)
Dr. Harvie Junior in unit and updated on Pt status. Pt remains on Nitro gtt with no S/S of CP, this RN continues to titrate down due to Pt B/P and now HA. New orders for tylenol PRN.

## 2012-12-19 NOTE — Nurses Notes (Signed)
 Pt states mid epigastric chest pressure is 4/10 since GI cocktail. Dr. Kimberlee notified. Order to transfer to telemetry received and noted. Leotis BIRCH Ilia Dimaano, RN

## 2012-12-19 NOTE — Progress Notes (Signed)
[facility]  [place], [place] [address]     [place] CARDIOLOGY PROGRESS NOTE        [name],[name]  Date of Admission:  12/16/2012  Date of Birth:  1951-06-07    Date of Service:  12/19/2012    SUBJECTIVE: c/o of abdominal and lower chest pain. Feels much better after PCI. Symptoms worse after eating and improved with GI cocktail. She states she has been up and about walking in the hall. No relationship to pain. Otherwise has rested well and was moved down to tele unit from ICU earlier today.    Current Inpatient Medications:    Current Facility-Administered Medications:  acetaminophen (TYLENOL) tablet 650 mg Oral Q4H PRN   aspirin chewable tablet 81 mg 81 mg Oral Daily   citalopram (CELEXA) tablet 40 mg Oral Daily   clopidogrel (PLAVIX) 75 mg tablet 75 mg Oral Daily   GI cocktail oral solution 55 mL Oral Once   hydrochlorothiazide (HYDRODIURIL) tablet 25 mg Oral Daily   isosorbide dinitrate (ISORDIL) tablet 20 mg Oral 3x/day AC   losartan (COZAAR) tablet 25 mg Oral Daily   metoprolol tartrate (LOPRESSOR) tablet 100 mg Oral 2x/day   morphine 4 mg/mL injection 4 mg Intravenous Q3H PRN   nitroglycerin (NITROSTAT) sublingual tablet 0.4 mg Sublingual Q5 Min PRN   NS flush syringe 10 mL Intravenous Q8HRS   omeprazole-sodium bicarbonate (ZEGERID) 20-1,680 mg per oral packet 20 mg Oral Daily before Breakfast   ondansetron (ZOFRAN) 2 mg/mL injection 4 mg Intravenous Q6H PRN   rosuvastatin (CRESTOR) tablet 20 mg Oral QPM       Allergies   Allergen Reactions   . Capoten (Captopril)    . Demerol (Meperidine)    . Dilaudid (Hydromorphone)        Past Medical History:  Past Medical History   Diagnosis Date   . HTN (hypertension)    . Coronary artery disease    . Wears glasses    . Hyperlipemia    . Obesity      Past Surgical History   Procedure Laterality Date   . Hx heart catheterization       in [place]   . Hx hysterectomy     . Hx tmj arthotomy  x2   . Hx laminoplasty     . Hx cervical diskectomy              OBJECTIVE:    Vital Signs:  Temp (24hrs) Max:37.1 C (98.8 F)      Temperature: 36.8 C (98.3 F) (12/19/12 1424)  BP (Non-Invasive): 93/60 mmHg (12/19/12 1424)  MAP (Non-Invasive): 73 mmHG (12/19/12 1424)  Heart Rate: 83 (12/19/12 1424)  Respiratory Rate: 16 (12/19/12 1424)  Pain Score (Numeric, Faces): 4 (pt reports relief from GI cocktail) (12/19/12 1117)  SpO2-1: 96 % (12/19/12 1424)    Base (Admission) Weight:  Base Weight (ADM): 86.637 kg (191 lb)  Weight:  Weight: 89.495 kg (197 lb 4.8 oz)    I/O:  I/O last 24 hours to current time:    Intake/Output Summary (Last 24 hours) at 12/19/12 1733  Last data filed at 12/19/12 1400   Gross per 24 hour   Intake   1851 ml   Output    650 ml   Net   1201 ml     I/O last 3 completed shifts:  In: 1887.5 [P.O.:510; I.V.:1377.5]  Out: 650 [Urine:650]    Physical Exam:    General: moderately obese, no distress and vital  signs reviewed  Neck: No JVD  Lungs: Clear to auscultation bilaterally.   Cardiovascular: regular rate and rhythm  S1, S2 normal  no S3 or S4  no click present  no murmurs  no rub  Abdomen: Soft, non-tender  Extremities: No cyanosis or edema  Skin: Skin warm and dry  Neurologic: Grossly normal, CN II - XII grossly intact , Alert and oriented x3  Cath Access Site: Cath access site has healed well. No erythema. No bruising. Not tender. No bruit. No fluid collection. No pulsatile mass. Peripheral pulses intact in extremity with no evidence of any distal emboli or complications.      Xr Chest Ap Portable    12/16/2012   RADIOLOGIST: Richardean Chimera, MD/bw   CHEST (1 VIEW):   REASON FOR EXAMINATION:  Chest pain.   FINDINGS:  Mildly elevated left hemidiaphragm.  Lungs are clear.  Normal  heart size.  No pneumonia, congestive heart failure or effusions.      12/16/2012       No acute findings.          Transthoracic Echocardiogram - Adult  12/18/2012     1.  Normal left ventricular systolic function with suspected wall motion abnormality in the distal septum. 2.   Mild aortic insufficiency. 3.  Grade I left ventricular diastolic dysfunction.          ECG 12/19/2012 reviewed and shows NSR. The deep T wave inversions in the anterolateral leads persisting.  Tele: no arrythmias  Labs:  Lab Results for Last 24 Hours:    Results for orders placed during the hospital encounter of 12/16/12 (from the past 24 hour(s))   CBC W/ AUTOMATED DIFF - [facility] ONLY       Result Value Range    WBC 9.5  4.0 - 11.0 K/uL    RBC 4.49  4.00 - 5.10 M/uL    HGB 11.8 (*) 12.0 - 15.5 g/dL    HCT 16.1 (*) 09.6 - 45.0 %    MCV 79.9 (*) 82.0 - 97.0 fL    MCH 26.3 (*) 28.0 - 34.0 pg    MCHC 32.9 (*) 33.0 - 37.0 g/dL    RDW 04.5 (*) 40.9 - 13.0 %    PLATELET COUNT 310  150 - 400 K/uL    MPV 7.9  7.0 - 9.4 fL    PMN % 74.7  43.0 - 76.0 %    LYMPHOCYTE % 16.0  15.0 - 43.0 %    MONOCYTE % 7.5  4.8 - 12.0 %    EOSINOPHIL % 1.7  0.0 - 5.2 %    BASOPHILS % 0.1  0.0 - 1.4 %    PMN # 7.06 (*) 1.50 - 6.50 K/uL    LYMPHOCYTE # 1.51  0.70 - 3.20 K/uL    MONOCYTE # 0.71  0.20 - 0.90 K/uL    EOSINOPHIL # 0.17  0.00 - 0.50 K/uL    BASOPHIL # 0.01  0.00 - 0.10 K/uL   MAGNESIUM       Result Value Range    MAGNESIUM 1.8  1.7 - 2.5 mg/dL   COMPREHENSIVE METABOLIC PROFILE - [facility] ONLY       Result Value Range    GLUCOSE 99  70 - 110 mg/dL    BUN 20  6 - 22 mg/dL    CREATININE 8.11  9.14 - 1.00 mg/dL    ESTIMATED GLOMERULAR FILTRATION RATE >60  >60 ml/min    SODIUM 140  136 -  145 mmol/L    POTASSIUM 4.0  3.5 - 5.0 mmol/L    CHLORIDE 105  101 - 111 mmol/L    CARBON DIOXIDE 29  22 - 32 mmol/L    CALCIUM 9.3  8.5 - 10.5 mg/dL    TOTAL PROTEIN 5.7 (*) 6.0 - 8.0 g/dL    ALBUMIN 3.6  3.2 - 5.0 g/dL    BILIRUBIN, TOTAL 0.8  0.0 - 1.3 mg/dL    AST (SGOT) 21  0 - 45 IU/L    ALT (SGPT) 14  0 - 55 IU/L    ALKALINE PHOSPHATASE 75  35 - 120 IU/L   BILIRUBIN, CONJUGATED (DIRECT)       Result Value Range    BILIRUBIN,CONJUGATED 0.1  0.0 - 0.3 mg/dL     Problem List:  Active Hospital Problems    Diagnosis   . Primary Problem: NSTEMI (non-ST  elevated myocardial infarction)   . HTN (hypertension)   . Coronary artery disease         Assessment:  1. NON STEMI  2. S/p PCI with stent to the LAD (DES)  3. Chest and abdominal pain improved with GI cocktail.  4. EF 55 to 60% with mild AI on echo  5. Ambulating without exertional chest pain  6. Tolerating meds otherwise    Plan:  1. Added GI cocktail to her regimen for today  2. H2 blockers  3. Added low dose nitrates to her regimen. Will d/c HCTZ so that she can have adequate BP to tolerate this dose better.  4. Continue dual antiplatelet therapy  5. If pain free , anticipate D/c home tomorrow evening      Rondell Reams, MD, [facility] 12/19/2012, 5:33 PM

## 2012-12-19 NOTE — Nurses Notes (Signed)
Pt c/o 6/10 mid epigastric chest pressure at the beginning of eating her lunch tray. States pain is the same as previously noted. No sob/nausea/vomiting/diaphoresis. VSS. Paged Dr. Ramiro Harvest. Awaiting call back. Myrtha Mantis Takhia Spoon, RN

## 2012-12-19 NOTE — Nurses Notes (Signed)
Report given to Fleet Contras, RN on telemetry. Myrtha Mantis Yajayra Feldt, RN

## 2012-12-19 NOTE — Nurses Notes (Signed)
Pt arrived via wheelchair from ICU. Pt is A & O x 4. Denies pain at this time. IV saline lock in left AC. Lungs are clear, BS + x 4 quadrants. Peripheral pulses positive and palpable x 4. No edema noted. Oriented to phone and call bell. Resting comfortably at this time in bed.

## 2012-12-19 NOTE — Progress Notes (Signed)
[facility]  [place], [place] [address]    IP PROGRESS NOTE      Giangrande,Brean  Date of Admission:  12/16/2012  Date of Birth:  May 07, 1951  Date of Service:  12/19/2012    Chief Complaint: upper abdominal and lower chest pain+  Worse on eating, swallowing.   It is better after Gi cocktail.     Subjective: No N/V, SOB.  No other complaints.      Vital Signs:  Temp (24hrs) Max:37.1 C (98.8 F)      Temperature: 36.8 C (98.3 F)  BP (Non-Invasive): 93/60 mmHg  MAP (Non-Invasive): 73 mmHG  Heart Rate: 83  Respiratory Rate: 16  Pain Score (Numeric, Faces): 4 (pt reports relief from GI cocktail)  SpO2-1: 96 %    Current Medications:    Current Facility-Administered Medications:  acetaminophen (TYLENOL) tablet 650 mg Oral Q4H PRN   aspirin chewable tablet 81 mg 81 mg Oral Daily   citalopram (CELEXA) tablet 40 mg Oral Daily   clopidogrel (PLAVIX) 75 mg tablet 75 mg Oral Daily   hydrochlorothiazide (HYDRODIURIL) tablet 25 mg Oral Daily   losartan (COZAAR) tablet 25 mg Oral Daily   metoprolol tartrate (LOPRESSOR) tablet 100 mg Oral 2x/day   morphine 4 mg/mL injection 4 mg Intravenous Q3H PRN   nitroglycerin (NITROSTAT) sublingual tablet 0.4 mg Sublingual Q5 Min PRN   NS flush syringe 10 mL Intravenous Q8HRS   omeprazole-sodium bicarbonate (ZEGERID) 20-1,680 mg per oral packet 20 mg Oral Daily before Breakfast   ondansetron (ZOFRAN) 2 mg/mL injection 4 mg Intravenous Q6H PRN   rosuvastatin (CRESTOR) tablet 20 mg Oral QPM       Today's Physical Exam:  General: appears in good health. No distress.   Eyes: Pupils equal and round, reactive to light and accomodation.   HENT:Head atraumatic and normocephalic   Neck: No JVD or thyromegaly or lymphadenopathy   Lungs: Clear to auscultation bilaterally.   Cardiovascular: regular rate and rhythm, S1, S2 normal, no murmur,   Abdomen: Soft, non-tender, Bowel sounds normal, No hepatosplenomegaly    Extremities: extremities normal, atraumatic, no cyanosis or edema   Skin: Skin warm and dry   Neurologic: Grossly normal   Lymphatics: No lymphadenopathy   Psychiatric: Normal affect, behavior,       I/O:  I/O last 24 hours:      Intake/Output Summary (Last 24 hours) at 12/19/12 1653  Last data filed at 12/19/12 1400   Gross per 24 hour   Intake   1851 ml   Output    650 ml   Net   1201 ml     I/O current shift:         Labs  Please indicate ordered or reviewed)  Reviewed:   Lab Results for Last 24 Hours:    Results for orders placed during the hospital encounter of 12/16/12 (from the past 24 hour(s))   CBC W/ AUTOMATED DIFF - [facility] ONLY       Result Value Range    WBC 9.5  4.0 - 11.0 K/uL    RBC 4.49  4.00 - 5.10 M/uL    HGB 11.8 (*) 12.0 - 15.5 g/dL    HCT 60.4 (*) 54.0 - 45.0 %    MCV 79.9 (*) 82.0 - 97.0 fL    MCH 26.3 (*) 28.0 - 34.0 pg    MCHC 32.9 (*) 33.0 - 37.0 g/dL    RDW 98.1 (*) 19.1 - 13.0 %  PLATELET COUNT 310  150 - 400 K/uL    MPV 7.9  7.0 - 9.4 fL    PMN % 74.7  43.0 - 76.0 %    LYMPHOCYTE % 16.0  15.0 - 43.0 %    MONOCYTE % 7.5  4.8 - 12.0 %    EOSINOPHIL % 1.7  0.0 - 5.2 %    BASOPHILS % 0.1  0.0 - 1.4 %    PMN # 7.06 (*) 1.50 - 6.50 K/uL    LYMPHOCYTE # 1.51  0.70 - 3.20 K/uL    MONOCYTE # 0.71  0.20 - 0.90 K/uL    EOSINOPHIL # 0.17  0.00 - 0.50 K/uL    BASOPHIL # 0.01  0.00 - 0.10 K/uL   MAGNESIUM       Result Value Range    MAGNESIUM 1.8  1.7 - 2.5 mg/dL   COMPREHENSIVE METABOLIC PROFILE - [facility] ONLY       Result Value Range    GLUCOSE 99  70 - 110 mg/dL    BUN 20  6 - 22 mg/dL    CREATININE 1.61  0.96 - 1.00 mg/dL    ESTIMATED GLOMERULAR FILTRATION RATE >60  >60 ml/min    SODIUM 140  136 - 145 mmol/L    POTASSIUM 4.0  3.5 - 5.0 mmol/L    CHLORIDE 105  101 - 111 mmol/L    CARBON DIOXIDE 29  22 - 32 mmol/L    CALCIUM 9.3  8.5 - 10.5 mg/dL    TOTAL PROTEIN 5.7 (*) 6.0 - 8.0 g/dL    ALBUMIN 3.6  3.2 - 5.0 g/dL    BILIRUBIN, TOTAL 0.8  0.0 - 1.3 mg/dL    AST (SGOT) 21  0 - 45 IU/L     ALT (SGPT) 14  0 - 55 IU/L    ALKALINE PHOSPHATASE 75  35 - 120 IU/L   BILIRUBIN, CONJUGATED (DIRECT)       Result Value Range    BILIRUBIN,CONJUGATED 0.1  0.0 - 0.3 mg/dL       Radiology Tests (Please indicate ordered or reviewed)  Reviewed: N/A    Problem List:  Active Hospital Problems   (*Primary Problem)    Diagnosis   . *NSTEMI (non-ST elevated myocardial infarction)   . HTN (hypertension)   . Coronary artery disease       Assessment/ Plan:   1. NSTEMI -  Cont ASA, Crestor, metoprolol. S/P LAD stent by Dr. Annabell Sabal 6/25  2. CP on swallowing. Better after GI cocktail. PPI started. Give Carafate today. Monitor clinically. EKG and tele monitor looks ok.  3. HTN - controlled c/w metoprolol , HCTZ, cozaar.  4. Anxiety -  Cont celexa.  DVT/PE Prophylaxis: Lovenox.  Will follow.

## 2012-12-19 NOTE — Nurses Notes (Signed)
Pt c/o mid epigastric chest "pressure" 6/10. No sob/diaphoresis/nausea or vomiting. States pain is worse when eating. Morphine IV given per order. Will continue to monitor. Myrtha Mantis Brok Stocking, RN

## 2012-12-19 NOTE — Nurses Notes (Signed)
Spoke to Dr. Ramiro Harvest about continued chest pressure during eating. New orders received and noted. Myrtha Mantis Fawzi Melman, RN

## 2012-12-19 NOTE — Nurses Notes (Signed)
Dr. Ramiro Harvest on unit to see pt. Order for GI cocktail received and noted. Myrtha Mantis Germaine Shenker, RN

## 2012-12-19 NOTE — Care Plan (Signed)
Problem: General Plan of Care(Adult,OB)  Goal: Plan of Care Review(Adult,OB)  The patient and/or their representative will communicate an understanding of their plan of care   Outcome: Adequate for Discharge Date Met:  12/19/12  Assessment Patient is demonstrating a level of mobility consistent with the patient's normal level of function prior to admission. No skilled PT needs identified during evaluation this date. Will DC PT. Pt should be able to return home when cleared by MD. Recommend Phase II Cardiac Rehab as an Outpatient.    Goals:    Deferred as pt has no skilled PT needs    Plan:       Pt seen for eval only, no PT needs. Will DC PT. Recommend Phase II Cardiac Rehab as an Outpatient    Additional information:               Anticipated rehab needs at discharge: Phase II Cardiac Rehab as an Outpatient              Patient has been advised of PT diagnosis, goals and plan: Yes               Patient/family has given verbal consent for eval : Yes               Is the patient appropriate for skilled PT at this time? No    Posey Pronto, PT

## 2012-12-19 NOTE — PT Evaluation (Signed)
[facility]  [place], [place] [address]  Rehabilitation Services  Physical Therapy Initial Evaluation        Patient Name: [name]  Date of Birth: 10/20/1950  Height: 165.1 cm (5\' 5" )  Weight: 88.1 kg (194 lb 3.6 oz)  Room/Bed: ICU04/4  Payor: BLUE CROSS BLUE SHIELD / Plan: [place] [employer]/[place] PPO / Product Type: PPO /     Date/Time of Admission: 12/16/2012 11:43 AM  Admitting Diagnosis:  There are no admission diagnoses documented for this encounter.    [name] is a 62 y.o., female, admitted w/ chest pain, s/p cardiac cath    Past Medical Hx:    Past Medical History   Diagnosis Date   . HTN (hypertension)    . Coronary artery disease    . Wears glasses    . Hyperlipemia    . Obesity      Past Surgical Hx:    Past Surgical History   Procedure Laterality Date   . Hx heart catheterization       in [place]   . Hx hysterectomy     . Hx tmj arthotomy  x2   . Hx laminoplasty     . Hx cervical diskectomy       Past Social Hx:       History     Social History Narrative   . No narrative on file       Patient Information     Precautions:  recent cardiac cath     Barriers to learning:  None noted       Prior level of function     Usual living arrangement: alone     Type of dwelling:  Ground level apartment     Physical barriers in the home environment:  None     Previously independent with:  Mobility/ambulation      Comments:  Pt lives alone in an apartment w/ no steps to enter. Pt was independent w/ mobility and self care prior to admission. Pt was not using a device for ambulation.     Subjective:      Comments:  "I can walk just fine." -explained purpose of PT    Objective:     Cognition:  Person, Place and Time     Communication ability:  good       Range of Motion     RUE ROM WNL?  yes     LUE ROM WNL?  yes     RLE ROM WNL?  yes     LLE ROM WNL?  yes    Strength     RUE strength WNL?  Grossly 4/5     LUE strength WNL? Grossly 4/5     RLE strength WNL? Grossly 4/5      LLE strength WNL?  Grossly 4/5          Sensation:  Intact    Functional Ability     Supine - Sit:  Independent     Sit - Supine:  Independent     Sitting balance:  Good      Sit - stand:  Standby assist     Stand balance:  Good      Bed - Chair/ Chair - bed:  Standby assist     Comments:  Pt able to complete transfers unassisted w/o device. No LOB noted.     Ambulation     Ambulation surface:  In hallway x 200 ft  Ambulation ability:  Standby assist     Assistive devices:  Gait belt     Ambulation tolerance:  Good     Comments: Pt ambulating in hall approx 200 ft w/o device and stand by assist. No major LOB noted.      Vitals prior to gait: BP 97/57, HR 82  Vitals after gait: BP 101/65, HR 96    Assessment Patient is demonstrating a level of mobility consistent with the patient's normal level of function prior to admission. No skilled PT needs identified during evaluation this date. Will DC PT. Pt should be able to return home when cleared by MD. Recommend Phase II Cardiac Rehab as an Outpatient.    Goals:    Deferred as pt has no skilled PT needs    Plan:       Pt seen for eval only, no PT needs. Will DC PT. Recommend Phase II Cardiac Rehab as an Outpatient    Additional information:    Anticipated rehab needs at discharge: Phase II Cardiac Rehab as an Outpatient   Patient has been advised of PT diagnosis, goals and plan: Yes    Patient/family has given verbal consent for eval : Yes    Is the patient appropriate for skilled PT at this time? No    Posey Pronto, PT

## 2012-12-20 MED ORDER — ALUMINUM-MAG HYDROXIDE-SIMETHICONE 200 MG-200 MG-20 MG/5 ML ORAL SUSP
30.00 mL | ORAL | Status: DC | PRN
Start: 2012-12-20 — End: 2012-12-20
  Administered 2012-12-20: 30 mL via ORAL
  Filled 2012-12-20: qty 30

## 2012-12-20 MED ORDER — LACTULOSE 20 GRAM/30 ML ORAL SOLUTION
30.0000 mL | ORAL | Status: AC
Start: 2012-12-20 — End: 2012-12-20
  Administered 2012-12-20: 30 mL via ORAL
  Filled 2012-12-20: qty 30

## 2012-12-20 MED ORDER — LOSARTAN 25 MG TABLET
25.0000 mg | ORAL_TABLET | Freq: Every day | ORAL | Status: DC
Start: 2012-12-20 — End: 2013-01-17

## 2012-12-20 MED ORDER — PANTOPRAZOLE 40 MG TABLET,DELAYED RELEASE
40.00 mg | DELAYED_RELEASE_TABLET | Freq: Every day | ORAL | Status: DC
Start: 2012-12-20 — End: 2013-01-17

## 2012-12-20 MED ORDER — ROSUVASTATIN 20 MG TABLET
20.00 mg | ORAL_TABLET | Freq: Every evening | ORAL | Status: DC
Start: 2012-12-20 — End: 2013-01-17

## 2012-12-20 MED ORDER — ALUMINUM-MAG HYDROXIDE-SIMETHICONE 200 MG-200 MG-20 MG/5 ML ORAL SUSP
30.00 mL | Freq: Four times a day (QID) | ORAL | Status: DC | PRN
Start: 2012-12-20 — End: 2013-01-17

## 2012-12-20 MED ORDER — CLOPIDOGREL 75 MG TABLET
75.0000 mg | ORAL_TABLET | Freq: Every day | ORAL | Status: DC
Start: 2012-12-20 — End: 2013-01-17

## 2012-12-20 MED ADMIN — sodium chloride 0.9 % (flush) injection syringe: 0 mL | INTRAVENOUS

## 2012-12-20 MED ADMIN — losartan 25 mg tablet: 25 mg | ORAL | NDC 68084034611

## 2012-12-20 MED ADMIN — sodium chloride 0.9 % (flush) injection syringe: 10 mL | INTRAVENOUS | NDC 08290306546

## 2012-12-20 NOTE — Discharge Instructions (Signed)
Non-ST Segment Elevation Myocardial Infarction   A heart attack occurs when a blood vessel on the surface of the heart (coronary artery) is blocked and interrupts blood supply to the heart muscle. This causes that area of the heart muscle to permanently scar. This blockage may be caused by cholesterol buildup (atherosclerotic plaque) within a coronary artery. The plaque cracks which creates a rough surface where blood cells attach, forming a clot.  Chest discomfort that happens with exertion and goes away with rest is called angina. This is a warning signal that blood flow to the heart is not enough. Angina that does not go away or becomes worse may mean that there is actual heart damage and a scar may form.  ST elevation refers to waveforms seen on an EKG or tracing of the electrical activity in the heart. Your provider can tell if there has been damage to your heart from changes in the normal pattern. A NSTEMI heart attack may be smaller and not as serious as one with typical changes. After an NSTEMI, there is a higher chance of another heart attack and returning angina after you have recovered.  CAUSES   Plaque in the coronary arteries. It builds up over many years.   Smoking. Smoking reduces the oxygen supply to the heart because carbon monoxide is more readily carried by the blood cells. Smoking also makes plaque develop faster. STOP SMOKING.   A narrowing (spasm) of a coronary artery.   Increased oxygen use. This can occur during extreme stress or activities.   Use of stimulants. Stimulants, such as cocaine and amphetamines, dangerously and unpredictably increase the oxygen needs of the heart. They can make the heart beat faster and unevenly. Stop using street drugs.  RISK FACTORS   Age Risk increases with age for both men and women.   Menopause After menopause and age 70, women have heart attacks at the same rate as men.   Diabetes Maintaining a normal blood sugar and eating a balanced diet lessens  the chance of a heart attack.   Obesity Try to maintain a close to ideal body weight or as your caregiver suggests.   High blood pressure (hypertension) Makes the heart work harder.   High cholesterol Promotes the buildup of plaque in the blood vessels.  SYMPTOMS   Chest pain, especially if it radiates down the left arm, up into the neck, jaw or teeth, often comes from the heart. This is even more likely if the pain leaves with rest. Problems with the heart may mimic indigestion and anyone older than 35 should not ignore this symptom. Other typical problems (symptoms) include:   Profound sweating.   Feeling faint, weak or light-headed.   Feeling sick to your stomach.   Loss of normal color.  DIAGNOSIS   A combination of your history, an exam, EKG findings and blood work results determine if you have had a heart attack.  It is very important to seek medical care right away for episodes of chest pain. The sooner you get treatment, the sooner you may return to your normal activities. If a large area of heart muscle lacks oxygen and medical care is not provided; a weak heart muscle, heart failure or sudden death may result.  WHAT MAY HAPPEN IF A HEART ATTACK IS SUSPECTED   Aspirin may be given, if you are able to take it. This makes your blood "thinner" (less likely to clot).   Thrombolytics ("clot busters") may be given as long as it   is safe and if a cardiac cath lab is not available.   A heart monitor will display the electrical activity of your heart to check for abnormal beats or rhythm.   An EKG is a painless procedure that gives information about areas of heart muscle that may be injured.   Your blood oxygen level may be monitored by a painless sensor attached to your finger or ear.   Blood tests are used to find out whether the heart muscle has been damaged.   A chest X-ray can give some indication of how well the heart and lungs are functioning.   A coronary angiogram may be performed. This is a  procedure where dye is injected into your coronary arteries and x-rays are taken to determine which blood vessels are blocked.   Angioplasty or stent placement may be used during an angiogram to open a blocked vessel.   An echocardiogram is a painless and risk free external sonar exam that may be used to examine your heart valves, muscle function and blood flow within the heart.  TREATMENT   Length of hospital stays vary from a couple days to a week. This depends on the amount of heart damage and the severity of any complications.   If your symptoms are a false alarm and no heart disease is found, the stay is often less than 24 hours.   Medications may be used for reducing pain, keeping your heart beat regularly, helping your breathing and controlling your blood pressure.   Blood thinners may be used to dissolve clots.   If you have a single small artery blockage and no heart damage, you may have a balloon angioplasty. This procedure may displace the blockage and restore normal heart circulation. Stents most often follow angioplasty right away. Stents are small wire mesh-like tubes that help keep the artery open. The earlier this is done, the better.   Severe heart problems may require open heart surgery. This is a procedure where blocked arteries are bypassed with small veins from your legs or arteries from inside your chest wall. If important arteries are involved, or if your chest pain continues, this may be the best method to make ensure your long-term survival.  ABOUT YOUR HOSPITAL STAY   While you are in the hospital, you may be placed on a low salt, low fat diet and given a stool softener. The stool softener will keep you from straining during a bowel movement.   Oxygen may be given to increase oxygen delivery to the heart.   Medications may be prescribed, while in the hospital, to help your heart and lungs work better.   Before discharge from the hospital, a stress test may be performed. In this  test, an ECG measures how well your heart works with exercise. The test may be done while you are walking on a treadmill, using a stationary bicycle or after being given medications to make your heart beat faster. It can be used to judge the safety of your proposed activity levels. It may provide a starting point for your exercise program.  HOME CARE INSTRUCTIONS    Follow the treatment plan your caregiver prescribes.   Carry medications, such as nitroglycerine, with you at all times, if directed.   Make a list of every medicine you are taking. Keep it up-to-date and with you all the time.   Get help from your caregiver or pharmacist to learn the following about each medicine:   Why you are taking it.     What time of day to take it.   Possible side effects.   Foods to take with it or avoid.   When to stop taking it.   Try to maintain normal blood lipid levels.   Eat a heart healthy diet with salt and fat restrictions as advised.   Activity Level Everyone heals at a different rate. Decisions about when you may go back to work, start to exercise or have sex should be made with the guidance of your caregivers. Pace your activities to avoid shortness of breath or chest pain.   Weight Monitoring Weigh yourself every day. You should weigh yourself in the morning after you urinate and before you eat breakfast. Wear the same amount of clothing when you weigh yourself. Record your weight daily. Bring your recorded weights to your clinic visits. Tell your caregiver right away if you have gained 3 lb/1.4 kg in 1 day or 5 lb/2.3 kg in a week.   Blood pressure monitoring This should be done as often as you are told to check it. You can get a home blood pressure cuff at your drugstore. Record these values and bring them with you for your health checks.   Smoking If you are currently a smoker, it is time to quit. Nicotine makes your heart work harder. Do not use nicotine gum or patches before checking with your  doctor. Find a support group or therapist to help you quit.   Follow-up Be sure to make and keep an appointment with your caregiver. Appointments with your cardiologist and other caregivers may also be needed.  SEEK IMMEDIATE MEDICAL CARE IF:    You have severe chest pain, especially if the pain is crushing or pressure-like and spreads to the arms, back, neck or jaw. THIS IS AN EMERGENCY. Do not wait to see if the pain will go away. Get medical help at once. Call your local emergency services (911 in the [place] DO NOT drive yourself to the hospital.   You start sweating, feel sick to your stomach or are short of breath.   Your weight increases by 3 lb/1.4 kg or more in 1 day or 5 lb/2.3 kg in a week.   You notice increasing shortness of breath during rest, sleeping or with activity.   You develop an increase in angina or develop chest pain which is unusual for you.   You are unable to sleep because you cannot breathe.  MAKE SURE YOU:    Understand these instructions.   Will watch your condition.   Will get help right away if you are not doing well or get worse.  Document Released: 01/09/2005 Document Revised: 09/04/2011 Document Reviewed: 08/09/2007  [facility] Patient Information 2014 [place], [place].    Scheduled follow-up appointment with Dr. Tharon Aquas on Wednesday, July 2nd at 0915.  Scheduled follow-up appointment with Dr. Annabell Sabal on Friday, July 25th at 1140.

## 2012-12-20 NOTE — Nurses Notes (Signed)
Pt discharged per discharge instructions. Education provided on acute MI, cardiac catheretization, heart failure and new medications. Pt provided with printed material for future reference. Educated patient on new medications and a detailed schedule for safe administration. Pt instructed to maintain prescheduled appointments with PCP and specialists. Pt stated understanding. Denies questions at this time, IV removed intact. Tele removed. Pt refused transport to lobby, ambulated off floor with family. Orma Render, RN

## 2012-12-20 NOTE — Care Management Notes (Signed)
 Information re Plavix  and Cardiac rehab given to pt.

## 2012-12-20 NOTE — Discharge Summary (Signed)
[name]  [place], [place] [address]    DISCHARGE SUMMARY      PATIENT NAMENekisha, [name]  MRN:  [id number]  DOB:  [birth year]    ADMISSION DATE:  12/16/2012  DISCHARGE DATE:  12/20/2012    ATTENDING PHYSICIAN: Dorthy Cooler, MD  PRIMARY CARE PHYSICIAN: Sid Falcon, MD     ADMISSION DIAGNOSIS: NSTEMI (non-ST elevated myocardial infarction)  DISCHARGE DIAGNOSIS:   Active Hospital Problems    Diagnosis Date Noted   . Principle Problem: NSTEMI (non-ST elevated myocardial infarction) 12/16/2012   . HTN (hypertension)    . Coronary artery disease       Resolved Hospital Problems    Diagnosis    No resolved problems to display.     There are no active non-hospital problems to display for this patient.     DISCHARGE MEDICATIONS:  Current Discharge Medication List      START taking these medications    Details   aluminum-magnesium hydroxide-simethicone (MAALOX MAX) 200-200-20 mg/5 mL Oral Suspension Take 30 mL by mouth Every 6 hours as needed (heartburn)      clopidogrel (PLAVIX) 75 mg Oral Tablet Take 1 Tab (75 mg total) by mouth Once a day  Qty: 30 Tab, Refills: 0      losartan (COZAAR) 25 mg Oral Tablet Take 1 Tab (25 mg total) by mouth Once a day  Qty: 30 Tab, Refills: 0      pantoprazole (PROTONIX) 40 mg Oral Tablet, Delayed Release (E.C.) Take 1 Tab (40 mg total) by mouth Once a day  Qty: 30 Tab, Refills: 0      rosuvastatin (CRESTOR) 20 mg Oral Tablet Take 1 Tab (20 mg total) by mouth Every evening  Qty: 30 Tab, Refills: 0         CONTINUE these medications which have NOT CHANGED    Details   aspirin 81 mg Oral Tablet, Chewable Take 81 mg by mouth Once a day      citalopram (CELEXA) 40 mg Oral Tablet Take 40 mg by mouth Once a day      metoprolol (LOPRESSOR) 100 mg Oral Tablet Take 100 mg by mouth Twice daily         STOP taking these medications       fluconazole (DIFLUCAN) 50 mg Oral Tablet Comments:   Reason for Stopping:          hydrochlorothiazide (HYDRODIURIL) 25 mg Oral Tablet Comments:   Reason for Stopping:             DISCHARGE INSTRUCTIONS:     DISCHARGE INSTRUCTION - DIET   Diet: CARDIAC DIET      DISCHARGE INSTRUCTION - ACTIVITY   Activity: MAY RESUME PREVIOUS ACTIVITY      SCHEDULE FOLLOW-UP PRIMARY CARE PHYSICIAN   Follow-up in: 1 WEEK    Reason for visit: HOSPITAL DISCHARGE    Provider: Dr. Tharon Aquas      SCHEDULE FOLLOW-UP OTHER/CONSULTING PHYSICIAN   Follow-up in: 4 WEEKS    Reason for visit: HOSPITAL DISCHARGE    Provider: Dr. Annabell Sabal      ASPIRIN ALREADY ORDERED     CARDIAC REHAB PHASE II - [place]   Indication for Therapy MYOCARDIAL INFARCTION    Indication for Therapy PTCA/STENT    Method to Determine THR for Program Duration TO BE DETERMINED BY THE CARDIAC REHAB STAFF    Exercise Intensity or MET Level TO BE DETERMINED BY CARDIAC REHAB STAFF    Assign Risk Stratification (  ACSM Guidelines) INTERMEDIATE RISK      REASON FOR HOSPITALIZATION AND HOSPITAL COURSE:  This is a 62 y.o., female came to [place] with chest pain. She was treated for NSTEMI. She underwent Lt heart cath by Dr. Annabell Sabal. LAD was stented.  She feels better. D/c home. D/c time spent 43 minutes.    Coronary angiography:   LM: Normal; Short   LAD: mid 99%; TIMI-1 flow pre intervention;   After intervention 0% residual ds in stent and TIMI-3 flow restored.   Dx-2 branch proximal 30%   ZOX:WRUEA vessel; non obstructive   RCA: proximal 30%; non obstructive; dominant   Impression: Successful PTCA and stent to mid LAD with a 3x51mm Promus stent      Physical exam :  GENERAL: The patient is alert and oriented to time, place and person.   HEENT: Normocephalic, anicteric. No pallor. PERRL.   NECK: Supple. No jugular venous distention.   LUNGS: clear breath sounds are audible bilaterally. Good bilateral air entry.  HEART: Both first and second sounds are audible, regular sinus rhythm. No murmur.  ABDOMEN: Soft, bowel sounds are present, nontender, no hepatosplenomegaly.    EXTREMITIES: No edema. Peripheral pulses are present.   CENTRAL NERVOUS SYSTEM: No focal deficit, no cranial nerve palsy.   SKIN: No rashes or bruises.   MUSCULOSKELETAL SYSTEM: No deformity or swelling.   PSYCHIATRIC: No anxiety or depression.   HEMATOLOGIC: No ecchymosis, petechia or hematoma.      COURSE IN HOSPITAL: As above.  Please see Dr. Daisy Blossom  H&P for admission details.  1. NSTEMI -   Cont ASA, Crestor, metoprolol. S/P LAD stent by Dr. Annabell Sabal 6/25  2. CP on swallowing. Better after GI cocktail. Cont Protonix at home.  3. HTN - controlled c/w metoprolol, HCTZ, cozaar.  4. Anxiety - Cont celexa.    CONDITION ON DISCHARGE: Alert, Oriented and VS Stable    DISCHARGE DISPOSITION:  Home discharge     cc: Primary Care Physician:  Sid Falcon, MD  [phone] [place]  [place] [address]     YN:[id number] Physician:  No referring provider defined for this encounter.

## 2012-12-23 NOTE — CDI REVIEW (Signed)
 [facility] - Follow Up Review     Working DRG:   247  Problem List:   NSTEMI 58929, CAD 41400, HTN 4019, Hypokalemia 2768, Anxiety 311, Obesity 27800, Lipids 2724,    Procedure:  Cardiac Cath Left, [id number]/[id number]    Diagnostic:  6/24 Echo - Normal left ventricular systolic function with suspected wall motion abnormality in the distal septum, Grade I left ventricular diastolic dysfunction.     Consults:  6/24 Jalisi -   Non STEMI/ ACS   Recurrent chest pain/ pressure   HTN   Hlp   Obesity   Known CAD    Operative Report:  6/25 CAth - Successful PTCA and stent to mid LAD with a 3x47mm Promus stent    D/C Summary:  6/27 [name] - came to us  with chest pain. She was treated for NSTEMI.     Comments: DRG adjusted based on clinical findings

## 2013-01-13 ENCOUNTER — Ambulatory Visit
Admission: RE | Admit: 2013-01-13 | Discharge: 2013-01-13 | Disposition: A | Discharge status: Still In Hospital | Payer: BC Managed Care – PPO | Source: Ambulatory Visit | Attending: CARDIOVASCULAR DISEASE | Admitting: CARDIOVASCULAR DISEASE

## 2013-01-13 DIAGNOSIS — I214 Non-ST elevation (NSTEMI) myocardial infarction: Secondary | ICD-10-CM | POA: Insufficient documentation

## 2013-01-13 DIAGNOSIS — Z5189 Encounter for other specified aftercare: Secondary | ICD-10-CM | POA: Insufficient documentation

## 2013-01-13 DIAGNOSIS — Z9861 Coronary angioplasty status: Secondary | ICD-10-CM | POA: Insufficient documentation

## 2013-01-15 ENCOUNTER — Ambulatory Visit
Admission: RE | Admit: 2013-01-15 | Discharge: 2013-01-15 | Disposition: A | Discharge status: Still In Hospital | Payer: BC Managed Care – PPO | Source: Ambulatory Visit | Attending: CARDIOVASCULAR DISEASE | Admitting: CARDIOVASCULAR DISEASE

## 2013-01-17 ENCOUNTER — Ambulatory Visit (INDEPENDENT_AMBULATORY_CARE_PROVIDER_SITE_OTHER): Payer: BC Managed Care – PPO | Admitting: CARDIOVASCULAR DISEASE

## 2013-01-17 ENCOUNTER — Ambulatory Visit: Payer: BC Managed Care – PPO

## 2013-01-17 ENCOUNTER — Encounter (INDEPENDENT_AMBULATORY_CARE_PROVIDER_SITE_OTHER): Payer: Self-pay | Admitting: CARDIOVASCULAR DISEASE

## 2013-01-17 VITALS — BP 119/88 | HR 73 | Resp 16 | Ht 65.0 in | Wt 194.0 lb

## 2013-01-17 DIAGNOSIS — E782 Mixed hyperlipidemia: Secondary | ICD-10-CM

## 2013-01-17 DIAGNOSIS — I2510 Atherosclerotic heart disease of native coronary artery without angina pectoris: Secondary | ICD-10-CM

## 2013-01-17 DIAGNOSIS — I10 Essential (primary) hypertension: Secondary | ICD-10-CM

## 2013-01-17 DIAGNOSIS — K219 Gastro-esophageal reflux disease without esophagitis: Secondary | ICD-10-CM

## 2013-01-17 MED ORDER — LOSARTAN 25 MG TABLET
25.0000 mg | ORAL_TABLET | Freq: Every evening | ORAL | Status: DC
Start: 2013-01-17 — End: 2013-03-07

## 2013-01-17 MED ORDER — ROSUVASTATIN 20 MG TABLET
20.0000 mg | ORAL_TABLET | Freq: Every evening | ORAL | Status: AC
Start: 2013-01-17 — End: 2013-04-17

## 2013-01-17 MED ORDER — ASPIRIN 81 MG CHEWABLE TABLET
81.0000 mg | CHEWABLE_TABLET | Freq: Every day | ORAL | Status: DC
Start: 2013-01-17 — End: 2014-01-30

## 2013-01-17 MED ORDER — NITROGLYCERIN 0.3 MG SUBLINGUAL TABLET
0.3000 mg | SUBLINGUAL_TABLET | SUBLINGUAL | Status: DC | PRN
Start: 2013-01-17 — End: 2014-04-16

## 2013-01-17 MED ORDER — CLOPIDOGREL 75 MG TABLET
75.0000 mg | ORAL_TABLET | Freq: Every day | ORAL | Status: DC
Start: 2013-01-17 — End: 2013-06-20

## 2013-01-17 MED ORDER — METOPROLOL TARTRATE 100 MG TABLET
100.0000 mg | ORAL_TABLET | Freq: Two times a day (BID) | ORAL | Status: AC
Start: 2013-01-17 — End: 2013-04-17

## 2013-01-17 MED ORDER — PANTOPRAZOLE 40 MG TABLET,DELAYED RELEASE
40.0000 mg | DELAYED_RELEASE_TABLET | Freq: Every day | ORAL | Status: DC
Start: 2013-01-17 — End: 2015-02-18

## 2013-01-17 NOTE — Patient Instructions (Signed)
Cardiac rehab  Follow labs with PCP  Walking program  Diet changes as discussed  No tobacco exposure  Pickup scripts from pharmacy

## 2013-01-17 NOTE — Progress Notes (Signed)
See dictated note.

## 2013-01-18 NOTE — Progress Notes (Signed)
[facility]                                                                              PROGRESS NOTE    PATIENT NAME: [name], [name] [facility] NUMBER:[id number]  DATE OF SERVICE:01/17/2013  DATE OF BIRTH: 1951/04/05    PRIMARY CARE PHYSICIAN:  Dr. Sid Falcon.    SUBJECTIVE:  This is a very pleasant 62 year old African-American/black female who follows in the cardiology clinic after her recent hospitalization at [facility].  At that time, she had presented with severe chest discomfort, retrosternal pressure and heaviness, and subsequently had a cardiac troponin I elevated.  She had a non-ST myocardial infarction and subsequently was taken to the cardiac catheterization laboratory where critical coronary artery disease was found in the left anterior descending artery with a 99% lesion.  This required angioplasty followed by placement of a 3.0 x 24-mm Promus Premier drug-eluting stent to the mid vessel.  Flow was restored and her symptoms resolved.  She also had left circumflex with 20% stenosis, and right coronary artery with 30% stenosis.    Since the patient has been home, she states that occasionally her blood pressure stays on the lower side.  We have stopped her hydrochlorothiazide during the hospitalization because of borderline blood pressure.  However, she has been taking this at home, "since she has been on for a very long time".    Her blood pressure today is well controlled at 119/88.  She otherwise is asymptomatic.  She has joined cardiac rehabilitation and is keeping up with the program.  She will occasionally get sharp chest discomfort that lasts for 5-10 minutes.  It is not similar to the angina she had before and it resolves spontaneously.  It is not associated with exertion.  She has been compliant with medical therapy.  She states she has no groin problems.    PAST MEDICAL HISTORY:  1.  Coronary artery disease as described above  with stent to the left anterior descending artery with a Promus Premier stent.   2.  Hypertension.  3.  Obesity.  4.  Dyslipidemia.    PAST SURGICAL HISTORY:    1. Percutaneous coronary intervention, December 18, 2012.  2.  Status post hysterectomy.  3.  Status post temporomandibular joint surgery.  4.  Status post laminectomy; laminoplasty both upper and lower spine.  5.  Cervical discectomy.    FAMILY HISTORY:  Hypertension and diabetes in a sister and maternal grandmother; her mother had coronary artery disease at an older age.  Maternal grandmother had coronary disease at an elderly age and also a maternal uncle.    SOCIAL HISTORY:  Never smoked, no alcohol use or illicit drug use.    CURRENT MEDICATIONS:  1.  Aspirin 81 milligrams a day.  2.  Celexa.  3.  Plavix 75 milligrams once a day.  4.  Flexeril.  5.  Cozaar 25 milligrams at bedtime.  6.  Lopressor 100 milligrams twice a day  7.  Nitroglycerin sublingual As needed.  8.  Protonix 40 milligrams once a day.  9.  Crestor 20 milligrams once in the evening.    PHYSICAL EXAMINATION:  Vital signs were reviewed.  Blood pressure is 119/88, pulse 73, respirations 16, oxygen saturation 95%.  Weight of 194 pounds, height 5 feet, 5 inches.  The patient is found to be alert and oriented x 3, in no acute distress.    HEENT:  Head is atraumatic, normocephalic dining.  Eyes are anicteric, conjunctivae clear.    NECK:  Revealed no jugulovenous distention; no carotid bruits.    LUNGS:  Clear to auscultation, no crackles or wheezing.  Good air entry.    CARDIOVASCULAR:  Regular rate and rhythm, S1, S2 with no added sounds, rubs, murmurs or gallops.    ABDOMEN:  Soft, nontender, no hepatosplenomegaly.    EXTREMITIES:  Lower extremities show no edema.  Peripheral pulses palpable, bilaterally symmetrical.    PSYCHIATRIC:  Appropriate affect.      RIGHT GROIN:  Right groin is soft, nontender, no bruising, bleeding or bruit.    DISCHARGE DIAGNOSES:  1.  Recent non-ST myocardial  infarction.  2.  Status post percutaneous intervention and placement of Promus Premier stent to the mid left anterior descending coronary artery.  3.  Obesity.  4.  Hypertension.  5.  Dyslipidemia.    PLAN:  The patient was asked to continue all medications as prescribed.  She should stop her hydrochlorothiazide which will help her blood pressure.  She can use this on a p.r.n. basis for any fluid retention.  She was given scripts for all her medications including sublingual nitroglycerin.  I feel that a sharp discomfort is probably part of her musculoskeletal pain that she has been having in her back and spine for years.  I did ask her to continue taking Protonix.  She has joined cardiac rehabilitation.  Dietary changes are being made.  A daily walking program was encouraged.  The patient will follow up in this office in 6 months and will follow with her primary care physician, Dr. Tharon Aquas, on a regular basis.      Rondell Reams, MD      ZO/[id number]; D: 01/17/2013 98:11:91 T: 01/18/2013 11:29:59    cc: Sid Falcon MD      [facility] [address]      [place], [place] [address]

## 2013-01-20 ENCOUNTER — Ambulatory Visit
Admission: RE | Admit: 2013-01-20 | Discharge: 2013-01-20 | Disposition: A | Discharge status: Still In Hospital | Payer: BC Managed Care – PPO | Source: Ambulatory Visit | Attending: CARDIOVASCULAR DISEASE | Admitting: CARDIOVASCULAR DISEASE

## 2013-01-22 ENCOUNTER — Ambulatory Visit
Admission: RE | Admit: 2013-01-22 | Discharge: 2013-01-22 | Disposition: A | Discharge status: Still In Hospital | Payer: BC Managed Care – PPO | Source: Ambulatory Visit | Attending: CARDIOVASCULAR DISEASE | Admitting: CARDIOVASCULAR DISEASE

## 2013-01-24 ENCOUNTER — Ambulatory Visit: Payer: BC Managed Care – PPO

## 2013-01-27 ENCOUNTER — Ambulatory Visit: Payer: BC Managed Care – PPO

## 2013-01-29 ENCOUNTER — Ambulatory Visit: Payer: BC Managed Care – PPO

## 2013-01-29 ENCOUNTER — Ambulatory Visit
Admission: RE | Admit: 2013-01-29 | Discharge: 2013-01-29 | Disposition: A | Discharge status: Still In Hospital | Payer: BC Managed Care – PPO | Source: Ambulatory Visit | Attending: CARDIOVASCULAR DISEASE | Admitting: CARDIOVASCULAR DISEASE

## 2013-01-29 DIAGNOSIS — I214 Non-ST elevation (NSTEMI) myocardial infarction: Secondary | ICD-10-CM | POA: Insufficient documentation

## 2013-01-31 ENCOUNTER — Ambulatory Visit: Payer: BC Managed Care – PPO

## 2013-02-03 ENCOUNTER — Ambulatory Visit: Payer: BC Managed Care – PPO

## 2013-02-03 ENCOUNTER — Ambulatory Visit
Admission: RE | Admit: 2013-02-03 | Discharge: 2013-02-03 | Disposition: A | Discharge status: Still In Hospital | Payer: BC Managed Care – PPO | Source: Ambulatory Visit | Attending: CARDIOVASCULAR DISEASE | Admitting: CARDIOVASCULAR DISEASE

## 2013-02-05 ENCOUNTER — Ambulatory Visit: Payer: BC Managed Care – PPO

## 2013-02-05 ENCOUNTER — Ambulatory Visit
Admission: RE | Admit: 2013-02-05 | Discharge: 2013-02-05 | Disposition: A | Discharge status: Still In Hospital | Payer: BC Managed Care – PPO | Source: Ambulatory Visit | Attending: CARDIOVASCULAR DISEASE | Admitting: CARDIOVASCULAR DISEASE

## 2013-02-07 ENCOUNTER — Ambulatory Visit
Admission: RE | Admit: 2013-02-07 | Discharge: 2013-02-07 | Disposition: A | Discharge status: Still In Hospital | Payer: BC Managed Care – PPO | Source: Ambulatory Visit | Attending: CARDIOVASCULAR DISEASE | Admitting: CARDIOVASCULAR DISEASE

## 2013-02-07 ENCOUNTER — Ambulatory Visit: Payer: BC Managed Care – PPO

## 2013-02-10 ENCOUNTER — Ambulatory Visit: Payer: BC Managed Care – PPO

## 2013-02-10 ENCOUNTER — Ambulatory Visit
Admission: RE | Admit: 2013-02-10 | Discharge: 2013-02-10 | Disposition: A | Discharge status: Still In Hospital | Payer: BC Managed Care – PPO | Source: Ambulatory Visit | Attending: CARDIOVASCULAR DISEASE | Admitting: CARDIOVASCULAR DISEASE

## 2013-02-12 ENCOUNTER — Ambulatory Visit
Admission: RE | Admit: 2013-02-12 | Discharge: 2013-02-12 | Disposition: A | Discharge status: Still In Hospital | Payer: BC Managed Care – PPO | Source: Ambulatory Visit | Attending: CARDIOVASCULAR DISEASE | Admitting: CARDIOVASCULAR DISEASE

## 2013-02-12 ENCOUNTER — Ambulatory Visit: Payer: BC Managed Care – PPO

## 2013-02-14 ENCOUNTER — Ambulatory Visit: Payer: BC Managed Care – PPO

## 2013-02-14 ENCOUNTER — Ambulatory Visit
Admission: RE | Admit: 2013-02-14 | Discharge: 2013-02-14 | Disposition: A | Discharge status: Still In Hospital | Payer: BC Managed Care – PPO | Source: Ambulatory Visit | Attending: CARDIOVASCULAR DISEASE | Admitting: CARDIOVASCULAR DISEASE

## 2013-02-17 ENCOUNTER — Ambulatory Visit
Admission: RE | Admit: 2013-02-17 | Discharge: 2013-02-17 | Disposition: A | Discharge status: Still In Hospital | Payer: BC Managed Care – PPO | Source: Ambulatory Visit | Attending: CARDIOVASCULAR DISEASE | Admitting: CARDIOVASCULAR DISEASE

## 2013-02-17 ENCOUNTER — Ambulatory Visit: Payer: BC Managed Care – PPO

## 2013-02-19 ENCOUNTER — Ambulatory Visit
Admission: RE | Admit: 2013-02-19 | Discharge: 2013-02-19 | Disposition: A | Discharge status: Still In Hospital | Payer: BC Managed Care – PPO | Source: Ambulatory Visit | Attending: CARDIOVASCULAR DISEASE | Admitting: CARDIOVASCULAR DISEASE

## 2013-02-19 ENCOUNTER — Ambulatory Visit: Payer: BC Managed Care – PPO

## 2013-02-21 ENCOUNTER — Ambulatory Visit: Payer: BC Managed Care – PPO

## 2013-02-21 ENCOUNTER — Ambulatory Visit
Admission: RE | Admit: 2013-02-21 | Discharge: 2013-02-21 | Disposition: A | Discharge status: Still In Hospital | Payer: BC Managed Care – PPO | Source: Ambulatory Visit | Attending: CARDIOVASCULAR DISEASE | Admitting: CARDIOVASCULAR DISEASE

## 2013-02-26 ENCOUNTER — Ambulatory Visit
Admission: RE | Admit: 2013-02-26 | Discharge: 2013-02-26 | Disposition: A | Discharge status: Still In Hospital | Payer: BC Managed Care – PPO | Source: Ambulatory Visit | Attending: CARDIOVASCULAR DISEASE | Admitting: CARDIOVASCULAR DISEASE

## 2013-02-26 ENCOUNTER — Ambulatory Visit: Payer: BC Managed Care – PPO

## 2013-02-26 DIAGNOSIS — Z951 Presence of aortocoronary bypass graft: Secondary | ICD-10-CM | POA: Insufficient documentation

## 2013-02-26 DIAGNOSIS — Z5189 Encounter for other specified aftercare: Secondary | ICD-10-CM | POA: Insufficient documentation

## 2013-02-26 DIAGNOSIS — I214 Non-ST elevation (NSTEMI) myocardial infarction: Secondary | ICD-10-CM | POA: Insufficient documentation

## 2013-02-28 ENCOUNTER — Ambulatory Visit: Payer: BC Managed Care – PPO

## 2013-03-03 ENCOUNTER — Ambulatory Visit
Admission: RE | Admit: 2013-03-03 | Discharge: 2013-03-03 | Disposition: A | Discharge status: Still In Hospital | Payer: BC Managed Care – PPO | Source: Ambulatory Visit | Attending: CARDIOVASCULAR DISEASE | Admitting: CARDIOVASCULAR DISEASE

## 2013-03-03 ENCOUNTER — Ambulatory Visit: Payer: BC Managed Care – PPO

## 2013-03-05 ENCOUNTER — Ambulatory Visit: Payer: BC Managed Care – PPO

## 2013-03-05 ENCOUNTER — Ambulatory Visit
Admission: RE | Admit: 2013-03-05 | Discharge: 2013-03-05 | Disposition: A | Discharge status: Still In Hospital | Payer: BC Managed Care – PPO | Source: Ambulatory Visit | Attending: CARDIOVASCULAR DISEASE | Admitting: CARDIOVASCULAR DISEASE

## 2013-03-07 ENCOUNTER — Encounter (INDEPENDENT_AMBULATORY_CARE_PROVIDER_SITE_OTHER): Payer: Self-pay | Admitting: CARDIOVASCULAR DISEASE

## 2013-03-07 ENCOUNTER — Ambulatory Visit: Payer: BC Managed Care – PPO

## 2013-03-07 ENCOUNTER — Ambulatory Visit
Admission: RE | Admit: 2013-03-07 | Discharge: 2013-03-07 | Disposition: A | Discharge status: Still In Hospital | Payer: BC Managed Care – PPO | Source: Ambulatory Visit | Attending: CARDIOVASCULAR DISEASE | Admitting: CARDIOVASCULAR DISEASE

## 2013-03-07 NOTE — Progress Notes (Signed)
Pt called and stated her BP was low 80/50, so Dr. Annabell Sabal advised her to stop Cozaar and Hydrochlorothiazide. Also, I told pt to stop in our clinic for BP ckeck up, and it was 123/70, and her pulse 88. Pt was advised to come back next week for BP f/u.

## 2013-03-10 ENCOUNTER — Ambulatory Visit: Payer: BC Managed Care – PPO

## 2013-03-10 ENCOUNTER — Ambulatory Visit
Admission: RE | Admit: 2013-03-10 | Discharge: 2013-03-10 | Disposition: A | Discharge status: Still In Hospital | Payer: BC Managed Care – PPO | Source: Ambulatory Visit | Attending: CARDIOVASCULAR DISEASE | Admitting: CARDIOVASCULAR DISEASE

## 2013-03-12 ENCOUNTER — Ambulatory Visit
Admission: RE | Admit: 2013-03-12 | Discharge: 2013-03-12 | Disposition: A | Discharge status: Still In Hospital | Payer: BC Managed Care – PPO | Source: Ambulatory Visit | Attending: CARDIOVASCULAR DISEASE | Admitting: CARDIOVASCULAR DISEASE

## 2013-03-12 ENCOUNTER — Ambulatory Visit: Payer: BC Managed Care – PPO

## 2013-03-14 ENCOUNTER — Ambulatory Visit
Admission: RE | Admit: 2013-03-14 | Discharge: 2013-03-14 | Disposition: A | Discharge status: Still In Hospital | Payer: BC Managed Care – PPO | Source: Ambulatory Visit | Attending: CARDIOVASCULAR DISEASE | Admitting: CARDIOVASCULAR DISEASE

## 2013-03-14 ENCOUNTER — Encounter (INDEPENDENT_AMBULATORY_CARE_PROVIDER_SITE_OTHER): Payer: Self-pay | Admitting: CARDIOVASCULAR DISEASE

## 2013-03-14 ENCOUNTER — Ambulatory Visit: Payer: BC Managed Care – PPO

## 2013-03-17 ENCOUNTER — Ambulatory Visit: Payer: BC Managed Care – PPO

## 2013-03-17 ENCOUNTER — Ambulatory Visit
Admission: RE | Admit: 2013-03-17 | Discharge: 2013-03-17 | Disposition: A | Discharge status: Still In Hospital | Payer: BC Managed Care – PPO | Source: Ambulatory Visit | Attending: CARDIOVASCULAR DISEASE | Admitting: CARDIOVASCULAR DISEASE

## 2013-03-19 ENCOUNTER — Ambulatory Visit
Admission: RE | Admit: 2013-03-19 | Discharge: 2013-03-19 | Disposition: A | Discharge status: Still In Hospital | Payer: BC Managed Care – PPO | Source: Ambulatory Visit | Attending: CARDIOVASCULAR DISEASE | Admitting: CARDIOVASCULAR DISEASE

## 2013-03-19 ENCOUNTER — Other Ambulatory Visit (INDEPENDENT_AMBULATORY_CARE_PROVIDER_SITE_OTHER): Payer: Self-pay | Admitting: CARDIOVASCULAR DISEASE

## 2013-03-19 ENCOUNTER — Encounter (INDEPENDENT_AMBULATORY_CARE_PROVIDER_SITE_OTHER): Payer: Self-pay | Admitting: CARDIOVASCULAR DISEASE

## 2013-03-19 ENCOUNTER — Ambulatory Visit: Payer: BC Managed Care – PPO

## 2013-03-19 NOTE — Telephone Encounter (Signed)
 Called to pharmacy

## 2013-03-19 NOTE — Progress Notes (Signed)
 Called to [facility] per request and authorized Crestor  20 mg, 1 tab every evening: #90, 1 refill.

## 2013-03-21 ENCOUNTER — Ambulatory Visit: Payer: BC Managed Care – PPO

## 2013-03-24 ENCOUNTER — Ambulatory Visit: Payer: BC Managed Care – PPO

## 2013-03-26 ENCOUNTER — Ambulatory Visit
Admission: RE | Admit: 2013-03-26 | Discharge: 2013-03-26 | Disposition: A | Discharge status: Still In Hospital | Payer: BC Managed Care – PPO | Source: Ambulatory Visit | Attending: CARDIOVASCULAR DISEASE | Admitting: CARDIOVASCULAR DISEASE

## 2013-03-26 ENCOUNTER — Ambulatory Visit: Payer: BC Managed Care – PPO

## 2013-03-26 DIAGNOSIS — I252 Old myocardial infarction: Secondary | ICD-10-CM | POA: Insufficient documentation

## 2013-03-26 DIAGNOSIS — Z5189 Encounter for other specified aftercare: Secondary | ICD-10-CM | POA: Insufficient documentation

## 2013-03-26 DIAGNOSIS — Z9861 Coronary angioplasty status: Secondary | ICD-10-CM | POA: Insufficient documentation

## 2013-03-28 ENCOUNTER — Ambulatory Visit: Payer: BC Managed Care – PPO

## 2013-03-28 ENCOUNTER — Ambulatory Visit
Admission: RE | Admit: 2013-03-28 | Discharge: 2013-03-28 | Disposition: A | Discharge status: Still In Hospital | Payer: BC Managed Care – PPO | Source: Ambulatory Visit | Attending: CARDIOVASCULAR DISEASE | Admitting: CARDIOVASCULAR DISEASE

## 2013-03-31 ENCOUNTER — Ambulatory Visit
Admission: RE | Admit: 2013-03-31 | Discharge: 2013-03-31 | Disposition: A | Discharge status: Still In Hospital | Payer: BC Managed Care – PPO | Source: Ambulatory Visit | Attending: CARDIOVASCULAR DISEASE | Admitting: CARDIOVASCULAR DISEASE

## 2013-03-31 ENCOUNTER — Ambulatory Visit: Payer: BC Managed Care – PPO

## 2013-04-02 ENCOUNTER — Ambulatory Visit: Payer: BC Managed Care – PPO

## 2013-04-04 ENCOUNTER — Ambulatory Visit
Admission: RE | Admit: 2013-04-04 | Discharge: 2013-04-04 | Disposition: A | Discharge status: Still In Hospital | Payer: BC Managed Care – PPO | Source: Ambulatory Visit | Attending: CARDIOVASCULAR DISEASE | Admitting: CARDIOVASCULAR DISEASE

## 2013-04-04 ENCOUNTER — Ambulatory Visit: Payer: BC Managed Care – PPO

## 2013-04-07 ENCOUNTER — Ambulatory Visit
Admission: RE | Admit: 2013-04-07 | Discharge: 2013-04-07 | Disposition: A | Discharge status: Still In Hospital | Payer: BC Managed Care – PPO | Source: Ambulatory Visit | Attending: CARDIOVASCULAR DISEASE | Admitting: CARDIOVASCULAR DISEASE

## 2013-04-09 ENCOUNTER — Ambulatory Visit: Payer: BC Managed Care – PPO

## 2013-04-11 ENCOUNTER — Ambulatory Visit: Payer: BC Managed Care – PPO

## 2013-04-14 ENCOUNTER — Ambulatory Visit: Payer: BC Managed Care – PPO

## 2013-04-16 ENCOUNTER — Ambulatory Visit: Payer: BC Managed Care – PPO

## 2013-04-18 ENCOUNTER — Ambulatory Visit: Admission: RE | Admit: 2013-04-18 | Payer: BC Managed Care – PPO | Source: Ambulatory Visit

## 2013-04-21 ENCOUNTER — Ambulatory Visit: Payer: BC Managed Care – PPO

## 2013-04-23 ENCOUNTER — Ambulatory Visit: Payer: BC Managed Care – PPO

## 2013-05-20 ENCOUNTER — Other Ambulatory Visit (INDEPENDENT_AMBULATORY_CARE_PROVIDER_SITE_OTHER): Payer: Self-pay | Admitting: CARDIOVASCULAR DISEASE

## 2013-05-20 NOTE — Telephone Encounter (Signed)
 Called [facility] pharmacy per Dr. Jalisi and authorized Pantoprazole  40 mg daily # 30, 2 refills.

## 2013-05-20 NOTE — Telephone Encounter (Signed)
Called to pharmacy

## 2013-06-10 ENCOUNTER — Other Ambulatory Visit (INDEPENDENT_AMBULATORY_CARE_PROVIDER_SITE_OTHER): Payer: Self-pay | Admitting: CARDIOVASCULAR DISEASE

## 2013-06-10 NOTE — Telephone Encounter (Signed)
Please contact PCP

## 2013-06-20 ENCOUNTER — Other Ambulatory Visit (INDEPENDENT_AMBULATORY_CARE_PROVIDER_SITE_OTHER): Payer: Self-pay | Admitting: CARDIOVASCULAR DISEASE

## 2013-07-09 ENCOUNTER — Other Ambulatory Visit (INDEPENDENT_AMBULATORY_CARE_PROVIDER_SITE_OTHER): Payer: Self-pay | Admitting: CARDIOVASCULAR DISEASE

## 2013-07-09 NOTE — Telephone Encounter (Signed)
Pt stopped Losartan per Dr. Annabell SabalJalisi.

## 2013-07-30 ENCOUNTER — Encounter (INDEPENDENT_AMBULATORY_CARE_PROVIDER_SITE_OTHER): Payer: Self-pay | Admitting: CARDIOVASCULAR DISEASE

## 2013-07-30 ENCOUNTER — Ambulatory Visit (INDEPENDENT_AMBULATORY_CARE_PROVIDER_SITE_OTHER): Payer: BC Managed Care – PPO | Admitting: CARDIOVASCULAR DISEASE

## 2013-07-30 VITALS — BP 147/81 | HR 88 | Resp 18 | Ht 65.0 in | Wt 196.0 lb

## 2013-07-30 DIAGNOSIS — I10 Essential (primary) hypertension: Secondary | ICD-10-CM

## 2013-07-30 DIAGNOSIS — I2510 Atherosclerotic heart disease of native coronary artery without angina pectoris: Secondary | ICD-10-CM

## 2013-07-30 DIAGNOSIS — E782 Mixed hyperlipidemia: Secondary | ICD-10-CM

## 2013-07-30 DIAGNOSIS — R42 Dizziness and giddiness: Secondary | ICD-10-CM

## 2013-07-30 DIAGNOSIS — I259 Chronic ischemic heart disease, unspecified: Secondary | ICD-10-CM

## 2013-07-30 HISTORY — DX: Mixed hyperlipidemia: E78.2

## 2013-07-30 NOTE — Progress Notes (Signed)
See dictated note.

## 2013-07-30 NOTE — Progress Notes (Signed)
[facility]                                                                              PROGRESS NOTE    PATIENT NAME: [name], [name] [facility] NUMBER:[id number]  DATE OF SERVICE:07/30/2013  DATE OF BIRTH: 10/01/1950    PRIMARY CARE PHYSICIAN:  Dr. Sid Falcon.    SUBJECTIVE:  A 63 year old black female who comes to the cardiology clinic in a 29-month followup.  She has known coronary artery disease and on December 18, 2012, underwent percutaneous intervention with placement of a 3 x 24 mm Promus Element drug-eluting stent to the mid left anterior descending coronary artery.  Her left circumflex and right coronary artery had mild nonobstructive coronary artery disease.  She had a non-ST MI prior to the intervention.  A transthoracic echocardiogram done on December 17, 2012, revealed an ejection fraction to be normal, about 55-60% with mild aortic insufficiency and grade I left ventricular diastolic dysfunction.    She was last evaluated in the cardiology clinic on 01/17/2013 at which time she had gone through cardiac rehabilitation.  We adjusted her blood pressure medications because she felt her blood pressure was borderline.  We asked her to stop her hydrochlorothiazide and only use it for a p.r.n. basis for retention.  She was asked to continue Lopressor and in addition low-dose Cozaar was added to her regimen for blood pressure control.  She subsequently called the office and stated that her blood pressure was on the lower side and hence was told to stop the Cozaar; however, the patient states that when she did that her blood pressure became high and she continued to take her Cozaar.  She also did not stop taking hydrochlorothiazide and continued to do so and felt that 100 mg of Lopressor was too much for her and hence dropped it down to 50 milligrams twice a day.  She is to follow with Dr. Tharon Aquas in his office who is also following her blood work.    The patient  denies any angina over the last 6 months and has not had the need to use sublingual nitroglycerin which she carries with her.  Her main complaint today was that she will intermittently get dizzy.  This is usually with a postural change.  She states she has had this for 2-3 years and had an event monitor done in August 2013, by her primary care physician that basically shows sinus rhythm and occasional sinus tachycardia with this.  No other work up will be found in the [facility] records for this.  She states it is a chronic problem that has not changed.  It is usually resolves with her sitting down.  She has not had any syncopal or presyncopal events.  She denies any seizure disorder.  She has not been evaluated by neurology for this.  She admits to reduced hearing and occasional tinnitus.  She denies any typical vertigo.  She has not had any orthopnea, paroxysmal dyspnea, lower extremity edema, dyspnea on exertion or shortness of breath.    CURRENT MEDICATIONS:  1.  Aspirin 81 milligrams once a day.  2.  Flexeril.  3.  Voltaren.  4.  Fetzima.  5.  Cozaar 25 milligrams once a day.  6.  Lopressor 50 milligrams twice a day.  7.  Nitrostat sublingual as needed.  8.  Protonix.  9.  Crestor 20 milligrams every evening.    ALLERGIES:  CAPOTEN, DEMEROL and DILAUDID.    PAST MEDICAL HISTORY:  1.  Coronary artery disease, as described above, with drug-eluting stents to the LAD in 2014.  2.  Hypertension.  3.  Obesity.  4.  Dyslipidemia.    PAST SURGICAL HISTORY:  1.  Percutaneous intervention December 18, 2012.  2.  Status post hysterectomy.  3.  Status post temporomandibular joint surgery.  4.  Status post laminectomy with upper and lower spinous.  5.  Cervical diskectomy.    FAMILY HISTORY:  Hypertension in her sister and maternal grandmother and mother had CAD at an older age.  Maternal grandmother had coronary disease at an elderly age and a maternal uncle also had CAD at an older age.    SOCIAL HISTORY:  Never smoked, no alcohol  use, no illicit drug use.    REVIEW OF SYSTEMS:  Ten system review and is noncontributory except already mentioned in the history of present illness, past medical and past surgical history.    PHYSICAL EXAMINATION:    VITAL SIGNS:  Vital signs were reviewed with the patient and her blood pressure was 147/81, pulse 88, respirations 18, weight 196 pounds, height 5 feet 5 inches.    GENERAL:  The patient is found to be alert and oriented x3.  She is a pleasant African-American female in no acute distress.    HEENT:  Head is atraumatic, normocephalic.  Eyes are anicteric, conjunctivae clear.    NECK:  Revealed no JVD, no carotid bruits.    LUNGS:  Clear to auscultation, no crackles, no wheezing.  Good air entry.    CARDIOVASCULAR:  Regular rate and rhythm, S1 and S2, with no added sounds, rubs, murmurs or gallops.  Apex not displaced.    ABDOMEN:  Soft, nontender, no hepatosplenomegaly.    EXTREMITIES:  Lower extremities -- no edema.  Peripheral pulses are bilaterally symmetrical.    PSYCHIATRIC:  Appropriate affect.    ASSESSMENT:  1.  History of coronary artery disease with non-ST myocardial infarction in June 2014.  2.  Status post percutaneous intervention and placement of a 3 x 24 mm Promus Premier stent to the left anterior descending coronary artery in June 2014.  3.  Normal ejection fraction by echocardiogram.  4.  Obesity.  5.  Hypertension.  6.  Dyslipidemia.  7.  Chronic dizziness.  This is often position related, could be orthostatic in nature.  She is currently asymptomatic.    PLAN:  I have asked the patient to stay hydrated.  She does wear compression stockings which she has at home on a daily basis.  She will stop taking hydrochlorothiazide because she may be fluid sensitive.  She will try and make changes posture at a slow rate.  She is going to get a 48-hour Holter monitor to look for any arrhythmias.  To help control her blood pressure, she will increase her Lopressor to 75 milligrams twice a day.   She will monitor her blood pressure and bring this log into our nurse next week at which time we may need to make further adjustments.  She is going to get his blood work checked her PCP this week, which should include a CBC and Chem-7.  We have also referred her to neurology, Dr.  Vanessa BarbaraZamora, for further evaluation of her chronic dizziness.    Thank you Dr. Tharon AquasGlassford for allowing us to participate in the care of this patient.      Rondell ReamsFarrukh Tremaine Fuhriman, MD      [id number]; D: 07/30/2013 08:47:38 T: 07/30/2013 13:38:47    cc: Sid FalconJustin Glassford MD      [facility] [address], [address]      [place], [place] [phone]

## 2013-07-31 ENCOUNTER — Encounter (INDEPENDENT_AMBULATORY_CARE_PROVIDER_SITE_OTHER): Payer: Self-pay | Admitting: CARDIOVASCULAR DISEASE

## 2013-07-31 NOTE — Progress Notes (Signed)
Order and progress notes faxed to Dr. Vanessa BarbaraZamora. Fax confirmation received.

## 2013-08-04 ENCOUNTER — Encounter (INDEPENDENT_AMBULATORY_CARE_PROVIDER_SITE_OTHER): Payer: Self-pay | Admitting: CARDIOVASCULAR DISEASE

## 2013-08-04 ENCOUNTER — Ambulatory Visit (HOSPITAL_BASED_OUTPATIENT_CLINIC_OR_DEPARTMENT_OTHER)
Admission: RE | Admit: 2013-08-04 | Discharge: 2013-08-04 | Disposition: A | Discharge status: Home / Self Care | Payer: BC Managed Care – PPO | Source: Ambulatory Visit | Attending: CARDIOVASCULAR DISEASE | Admitting: CARDIOVASCULAR DISEASE

## 2013-08-04 DIAGNOSIS — I471 Supraventricular tachycardia: Secondary | ICD-10-CM

## 2013-08-04 DIAGNOSIS — I491 Atrial premature depolarization: Secondary | ICD-10-CM

## 2013-08-04 DIAGNOSIS — I2510 Atherosclerotic heart disease of native coronary artery without angina pectoris: Secondary | ICD-10-CM | POA: Insufficient documentation

## 2013-08-04 DIAGNOSIS — R42 Dizziness and giddiness: Secondary | ICD-10-CM | POA: Insufficient documentation

## 2013-08-04 DIAGNOSIS — I498 Other specified cardiac arrhythmias: Secondary | ICD-10-CM | POA: Insufficient documentation

## 2013-08-04 DIAGNOSIS — I4949 Other premature depolarization: Secondary | ICD-10-CM

## 2013-08-04 NOTE — Progress Notes (Signed)
Patient has appointment with Dr. Vanessa BarbaraZamora on September 01, 2013 @ 11am. Patient aware

## 2013-08-12 ENCOUNTER — Encounter (INDEPENDENT_AMBULATORY_CARE_PROVIDER_SITE_OTHER): Payer: Self-pay

## 2013-08-12 NOTE — Progress Notes (Signed)
Holter Monitor Report was sent to Dr. Tharon AquasGlassford for review today.

## 2013-08-20 ENCOUNTER — Other Ambulatory Visit (INDEPENDENT_AMBULATORY_CARE_PROVIDER_SITE_OTHER): Payer: Self-pay | Admitting: CARDIOVASCULAR DISEASE

## 2013-08-20 NOTE — Telephone Encounter (Signed)
Please contact PCP. Thanks

## 2013-08-25 ENCOUNTER — Other Ambulatory Visit (INDEPENDENT_AMBULATORY_CARE_PROVIDER_SITE_OTHER): Payer: Self-pay | Admitting: CARDIOVASCULAR DISEASE

## 2013-08-25 NOTE — Telephone Encounter (Signed)
Please contact PCP, thanks

## 2013-09-02 ENCOUNTER — Ambulatory Visit (INDEPENDENT_AMBULATORY_CARE_PROVIDER_SITE_OTHER): Payer: BC Managed Care – PPO | Admitting: CARDIOVASCULAR DISEASE

## 2013-09-02 ENCOUNTER — Encounter (INDEPENDENT_AMBULATORY_CARE_PROVIDER_SITE_OTHER): Payer: Self-pay | Admitting: CARDIOVASCULAR DISEASE

## 2013-09-02 ENCOUNTER — Ambulatory Visit
Admission: RE | Admit: 2013-09-02 | Discharge: 2013-09-02 | Disposition: A | Discharge status: Home / Self Care | Payer: BC Managed Care – PPO | Source: Ambulatory Visit | Attending: CARDIOVASCULAR DISEASE | Admitting: CARDIOVASCULAR DISEASE

## 2013-09-02 DIAGNOSIS — R42 Dizziness and giddiness: Secondary | ICD-10-CM | POA: Insufficient documentation

## 2013-09-02 DIAGNOSIS — I10 Essential (primary) hypertension: Secondary | ICD-10-CM

## 2013-09-02 DIAGNOSIS — I259 Chronic ischemic heart disease, unspecified: Secondary | ICD-10-CM

## 2013-09-02 DIAGNOSIS — H9190 Unspecified hearing loss, unspecified ear: Secondary | ICD-10-CM

## 2013-09-02 DIAGNOSIS — I498 Other specified cardiac arrhythmias: Secondary | ICD-10-CM

## 2013-09-02 DIAGNOSIS — I2510 Atherosclerotic heart disease of native coronary artery without angina pectoris: Secondary | ICD-10-CM

## 2013-09-02 LAB — BASIC METABOLIC PROFILE - BMC/JMC ONLY
ANION GAP: 7 mmol/L (ref 3–11)
BUN: 13 mg/dL (ref 6–22)
CALCIUM: 9.5 mg/dL (ref 8.5–10.5)
CARBON DIOXIDE: 30 mmol/L (ref 22–32)
CHLORIDE: 102 mmol/L (ref 101–111)
CREATININE: 0.67 mg/dL (ref 0.53–1.00)
ESTIMATED GLOMERULAR FILTRATION RATE: >60 mL/min (ref >60)
GLUCOSE: 105 mg/dL (ref 70–110)
POTASSIUM: 3.2 mmol/L — ABNORMAL LOW (ref 3.5–5.0)
SODIUM: 139 mmol/L (ref 136–145)

## 2013-09-02 LAB — CBC W/NO DIFF
HCT: 37.9 % (ref 36.0–45.0)
HGB: 12.2 g/dL (ref 12.0–15.5)
MCH: 24.2 pg — ABNORMAL LOW (ref 28.0–34.0)
MCHC: 32.1 g/dL — ABNORMAL LOW (ref 33.0–37.0)
MCV: 75 fL — AB (ref 82–97)
MPV: 8.1 fL (ref 7.0–9.4)
PLATELET COUNT: 391 10*3/uL (ref 150–400)
RBC: 5.04 M/uL (ref 4.00–5.10)
RDW: 17.5 % (ref 10.5–14.5)
WBC: 8.6 10*3/uL (ref 4.0–11.0)

## 2013-09-02 LAB — TSH CASCADE,3RD GEN WITH REFLEX TO FT4: TSH CASCADE: 1.412 u[IU]/mL (ref 0.340–5.600)

## 2013-09-02 MED ORDER — METOPROLOL TARTRATE 100 MG TABLET
100.0000 mg | ORAL_TABLET | Freq: Two times a day (BID) | ORAL | Status: AC
Start: 2013-09-02 — End: 2013-10-02

## 2013-09-02 NOTE — Progress Notes (Signed)
See dictated note.

## 2013-09-03 NOTE — Progress Notes (Signed)
[facility]                                                                              PROGRESS NOTE    PATIENT NAME: [name], [name]  HOSPITAL NUMBER:[id number]  DATE OF SERVICE:09/02/2013  DATE OF BIRTH: [birth year]    REASON FOR VISIT:  Dizziness and results of recent 48-hour Holter monitoring.      SUBJECTIVE:  A 63 year old black female who comes in cardiology for a 6-week followup.  We had last seen on July 30, 2013, at which time she had complaints of intermittent dizziness.  This is chronic and is often position-related and could be orthostatic in nature.  She was getting intermittent episodes where she feels "giddy" but denies any vertigo per se.  She has noticed that she has difficulty listening to her grandchildren and often their words are mumbled to her.   She has occasional tinnitus and with Dr. Vanessa BarbaraZamora checked her hearing, it was felt that one ear was worse than the other.  She is seeing Dr. Vanessa BarbaraZamora for dizziness workup as well as is going to follow up with him in the next few weeks as well.    The patient tells me that she has had chronic dizziness as well as chronic tachycardia for years and used to be on verapamil in the past.  We did a 48-hour Holter monitoring on her and she had frequent sinus tachycardia recorded with occasional PVCs and PACs.  The tachycardia was present 24% of the times during recording.  Interestingly, the patient's symptoms of dizziness were inconsistent and were present both with normal sinus rhythm and sinus tachycardia.  She did not have any sustained atrial or ventricular arrhythmias documented.    She denies any chest pain, shortness of breath, orthopnea or paroxysmal dyspnea.  She has mild chronic lower extremity edema which improves with elevation of her feet or with compression socks.    PAST CARDIAC HISTORY:    PAST CARDIAC HISTORY:  The patient has known coronary artery disease, status post non-ST myocardial  infarction in 2014.  On December 18, 2012, she underwent PCI with placement of a 3.0 x 24 mm Promus Element drug-eluting stent to the mid left anterior descending coronary artery.  The left circumflex and right coronary artery had mild nonobstructive disease.  Ejection fraction by transthoracic echocardiogram on December 17, 2012, revealed a normal ejection fraction of about 55-60% with mild aortic insufficiency and a grade I left ventricular diastolic dysfunction.    During her last visit, we asked her to increase her Lopressor to 75 milligrams twice a day which she tolerated well.  She also takes Cozaar for her blood pressure.  She has hydrochlorothiazide at home but is not using it to see if increased dose of Lopressor can be tolerated.    CURRENT MEDICATIONS:  1.  Aspirin 81 milligrams once a day.  2.  Plavix 75 milligrams once a day.  3.  Flexeril.  4.  Voltaren.   5.  Fetzima.   6.  Cozaar 25 milligrams once a day.  7.  Lopressor 75 milligrams twice a day.  7.  Nitrostat sublingual as needed.  8.  Protonix.  9.  Crestor 20 milligrams every evening.    ALLERGIES:  CAPOTEN, DEMEROL, DILAUDID.    FAMILY HISTORY, SOCIAL HISTORY, PAST MEDICAL HISTORY, PAST SURGICAL HISTORY:  Unchanged and as per my detailed note of July 30, 2013.    PHYSICAL EXAMINATION:  Blood pressure 161/90, pulse 96, respirations 18, weight 196 pounds, height 5 feet 5 inches.  The patient is found to be alert and oriented x3.  She is a pleasant African-American female in no acute distress.    HEENT:  Head is atraumatic, normocephalic.  Eyes are anicteric, conjunctivae clear.    NECK:  Revealed no jugular venous distention, no carotid bruits.    LUNGS:  Clear to auscultation, no crackles, no wheezing.  Good air entry.    CARDIOVASCULAR:  Regular rate and rhythm, S1 and S2, with no added sounds, rubs, murmurs, gallops.  Apex not displaced.  Chest wall nontender.    ABDOMEN:  Soft, nontender, no hepatosplenomegaly.  Bowel sounds are  present.    EXTREMITIES:  Lower extremities with no edema.  Peripheral pulses bilaterally symmetrical.    PSYCHIATRIC:  Appropriate affect.    ASSESSMENT:  1.  Chronic episodic dizziness.  Recent Holter monitor could not correlate her symptoms to an arrhythmia.  It was present both during normal sinus rhythm and with sinus tachycardia.  2.  Holter monitor suggests frequent sinus tachycardia.  3.  Known coronary artery disease with previous history of non-ST myocardial infarction in June 2014, as well as PCI with placement of a 3.0 x 24 mm Promus stent to the left anterior descending coronary artery on June 2014.  4.  Normal ejection fraction by echocardiography.  5.  Obesity.  6.  Hypertension.  7.  Dyslipidemia for which she is taking statins.    PLAN:  I asked the patient to stay hydrated.  She should wear compression stockings on a daily basis.  We increased her Lopressor to 100 milligrams twice a day and if her heart rate remains elevated, she is to increase this to 150 milligrams twice a day.  I discussed with her the frequency of her sinus tachycardia on monitoring.  To further assess this, she will get his CBC, Chem-7 and a thyroid function profile.  She states that this has not been checked through her PCP recently.  She was asked to maintain followup with Dr. Vanessa Barbara who is evaluating her for neurological cause of her symptoms.  I also referred her to Dr. Johnette Abraham in ENT because of her complaints of reduced hearing and tinnitus.  An ENT problem could certainly lead to her dizziness symptoms as well.    The patient will follow up in this office in 2 months.  She is also asked to keep a log of her heart rate and blood pressure and call our medical assistant over the next week to see if beta blockers need to be increased further.  If beta blockers cannot control her heart rate adequately, we may have to Cardizem to the current regimen.    Follow up in this office in 2 weeks.      Rondell Reams,  MD      [id number]; D: 09/02/2013 12:37:57 T: 09/03/2013 08:19:44    cc: Jule Economy MD      [address]      [place], [place] [address]            Sid Falcon MD      [facility] [address]  [place], [place] [address]

## 2013-09-08 ENCOUNTER — Ambulatory Visit (HOSPITAL_BASED_OUTPATIENT_CLINIC_OR_DEPARTMENT_OTHER)
Admission: RE | Admit: 2013-09-08 | Discharge: 2013-09-08 | Disposition: A | Discharge status: Home / Self Care | Payer: BC Managed Care – PPO | Source: Ambulatory Visit | Attending: CARDIOVASCULAR DISEASE | Admitting: CARDIOVASCULAR DISEASE

## 2013-09-08 DIAGNOSIS — R42 Dizziness and giddiness: Secondary | ICD-10-CM | POA: Insufficient documentation

## 2013-09-12 ENCOUNTER — Other Ambulatory Visit (INDEPENDENT_AMBULATORY_CARE_PROVIDER_SITE_OTHER): Payer: Self-pay | Admitting: CARDIOVASCULAR DISEASE

## 2013-09-12 MED ORDER — POTASSIUM CHLORIDE ER 20 MEQ TABLET,EXTENDED RELEASE(PART/CRYST)
20.00 meq | ORAL_TABLET | Freq: Two times a day (BID) | ORAL | Status: AC
Start: 2013-09-12 — End: 2013-09-19

## 2013-09-12 NOTE — Progress Notes (Signed)
Quick Note:    CC to PCP  Inform patient    There is no evidence for significant atherosclerotic disease or stenosis   in bilateral carotid arteries.  ______

## 2013-09-12 NOTE — Progress Notes (Signed)
Quick Note:    Cc to PCP labs of 09/02/13 and inform patient  Potassium is low. I have sent a script for Kdur for 10 days. Repeat serum BMP and Mg with her PCP next week  HB is stable  Platelets normal  Renal function ok  TSH ok      ______

## 2013-09-12 NOTE — Telephone Encounter (Signed)
Pt was called and informed of blood work results [facility]  She is aware she has to check blood work with her PCP next week.

## 2013-09-17 ENCOUNTER — Other Ambulatory Visit (INDEPENDENT_AMBULATORY_CARE_PROVIDER_SITE_OTHER): Payer: Self-pay | Admitting: CARDIOVASCULAR DISEASE

## 2013-09-23 ENCOUNTER — Encounter (INDEPENDENT_AMBULATORY_CARE_PROVIDER_SITE_OTHER): Payer: Self-pay | Admitting: CARDIOVASCULAR DISEASE

## 2013-09-23 NOTE — Progress Notes (Signed)
Pt informed per Dr Annabell SabalJalisi : lab results: Hb stable, platelets normal, renal function & thyroid ok.  Potassium low- Rx sent to pharmacy. (She will check with PCP for repeat BMP & Mag level as directed.) Estrellita LudwigPatricia Magon Croson, LPN  6/21/30863/31/2015, 10:36

## 2013-10-06 ENCOUNTER — Ambulatory Visit (HOSPITAL_BASED_OUTPATIENT_CLINIC_OR_DEPARTMENT_OTHER)
Admission: RE | Admit: 2013-10-06 | Discharge: 2013-10-06 | Disposition: A | Discharge status: Home / Self Care | Payer: BC Managed Care – PPO | Source: Ambulatory Visit

## 2013-10-06 DIAGNOSIS — I679 Cerebrovascular disease, unspecified: Secondary | ICD-10-CM | POA: Insufficient documentation

## 2013-10-06 DIAGNOSIS — R42 Dizziness and giddiness: Secondary | ICD-10-CM | POA: Insufficient documentation

## 2013-10-06 LAB — HEPATIC FUNCTION PANEL
ALBUMIN/GLOBULIN RATIO: 1.7
ALBUMIN: 4.3 g/dL (ref 3.2–5.0)
ALKALINE PHOSPHATASE: 91 IU/L (ref 35–120)
ALT (SGPT): 14 IU/L (ref 0–55)
AST (SGOT): 17 IU/L (ref 0–45)
BILIRUBIN, TOTAL: 0.8 mg/dL (ref 0.0–1.3)
BILIRUBIN,CONJUGATED: 0.2 mg/dL (ref 0.0–0.3)
TOTAL PROTEIN: 6.7 g/dL (ref 6.0–8.0)

## 2013-10-06 LAB — LIPID PANEL
CHOL/HDL RATIO: 1.8
CHOLESTEROL: 106 mg/dL — AB (ref 120–199)
HDL-CHOLESTEROL: 59 mg/dL (ref >39)
LDL (CALCULATED): 36 mg/dL (ref <130)
TRIGLYCERIDES: 57 mg/dL (ref <150)
VLDL (CALCULATED): 11 mg/dL (ref 5–35)

## 2013-10-06 LAB — HGA1C (HEMOGLOBIN A1C WITH EST AVG GLUCOSE)
ESTIMATED AVERAGE GLUCOSE: 123 mg/dL — ABNORMAL HIGH (ref 70–110)
GLYCOHEMOGLOBIN: 5.9 % (ref 4.0–6.0)
GLYCOHEMOGLOBIN: 5.9 % (ref 4.0–6.0)

## 2013-10-21 ENCOUNTER — Ambulatory Visit (INDEPENDENT_AMBULATORY_CARE_PROVIDER_SITE_OTHER): Payer: BC Managed Care – PPO | Admitting: Physician Assistant

## 2013-10-21 ENCOUNTER — Ambulatory Visit (INDEPENDENT_AMBULATORY_CARE_PROVIDER_SITE_OTHER): Payer: BC Managed Care – PPO | Admitting: UHP ENT

## 2013-10-21 ENCOUNTER — Encounter (INDEPENDENT_AMBULATORY_CARE_PROVIDER_SITE_OTHER): Payer: Self-pay | Admitting: Physician Assistant

## 2013-10-21 VITALS — BP 142/86 | HR 72 | Temp 96.4°F | Ht 65.0 in | Wt 199.0 lb

## 2013-10-21 DIAGNOSIS — R42 Dizziness and giddiness: Secondary | ICD-10-CM

## 2013-10-21 NOTE — Procedures (Signed)
See progress notes

## 2013-10-21 NOTE — Patient Instructions (Signed)
Dix-Hallpike testing was negative for BPPV. No ear related cause for patient's dizziness.  Recommend patient continue care with cardiologist and PCP for evaluation/treatment of dizziness.  Discussed sitting/standing for a few seconds before moving and adequate daily water intake.  Follow up as needed.

## 2013-10-21 NOTE — Progress Notes (Signed)
History: 63 year old female reported dizziness for about 6 months.  She noted that the episodes are not triggered by movement.  She noted that the episodes will last for seconds to minutes.  The patient stated that nausea is not associated.  The patient noted that her last episode of dizziness was this morning.  She noted that she is not taking medication for the dizziness.  She noted that she does have concerns with her hearing.  The patient denied tinnitus.    Impression: TYpe A tymp, AU. Mild hearing with a moderate dip at 6k Hz, AU.  Recommendation: 1) ENT f/u 2) repeat PRN.  MJMagness,Au.D CCC/A

## 2013-10-23 NOTE — H&P (Addendum)
PATIENT NAME:  [name]  MRN:  [id number]  DOB:  Jul 22, 1950  DATE OF SERVICE: 10/21/2013    Chief Complaint:  Dizziness      HPI:  Lynsay Fesperman is a 63 y.o. female who presents for evaluation of dizziness. Patient states she has experienced intermittent dizziness for the past 6 months. Patient states she feels "offbalanced/lightheaded" but has not experiencing any spinning sensation. Patient does not associate dizziness with any type of movement. Patient states dizziness lasts for seconds to a minute. Patient denies accompanying nausea/vomiting. Patient denies any new medications during this time. Patient denies all ear symptoms including bilateral ear pain/drainage, ringing, decreased hearing and fever. Patient states that her hearing is occasionally a little distorted. Patient denies history of ear/head trauma, ear infections/surgery and prolonged noise exposure. Patient denies personal or family history of vertigo and Meniere's disease. Patient was evaluated by a neurologist - Dr. Vanessa Barbara - who believes that dizziness is related to her heart. Patient has a history of cardiac stent placed in June 2014 and continues care with cardiologist - Dr. Annabell Sabal. Patient denies all nasal symptoms including facial pressure, nasal congestion, nasal drainage, sneezing and decreased smell/taste. Patient denies history of environmental allergies and sinus infections/surgery. No other complaints.    Past Medical History:  Past Medical History   Diagnosis Date    HTN (hypertension)     Coronary artery disease     Wears glasses     Hyperlipemia     Obesity     Mixed hyperlipidemia 07/30/2013       Past Surgical History:  Past Surgical History   Procedure Laterality Date    Hx heart catheterization       in [place]    Hx hysterectomy      Hx tmj arthotomy  x2    Hx laminoplasty      Hx cervical diskectomy         Family History:  Family History   Problem Relation Age of Onset    Hypertension Sister     Diabetes  Sister     Hypertension Maternal Grandmother     Coronary Artery Disease Maternal Grandmother      elderly    Coronary Artery Disease Maternal Uncle      elderly       Social History:  History   Smoking status    Never Smoker    Smokeless tobacco    Not on file     History   Alcohol Use No     Social History     Occupational History    Not on file.       Medications:  Outpatient Prescriptions Marked as Taking for the 10/21/13 encounter (Office Visit) with Spero Curb, PA-C   Medication Sig    aspirin 81 mg Oral Tablet, Chewable Take 1 Tab (81 mg total) by mouth Once a day    clopidogrel (PLAVIX) 75 mg Oral Tablet TAKE 1 TABLET DAILY    CRESTOR 20 mg Oral Tablet TAKE 1 TABLET EVERY EVENING    cyclobenzaprine (FLEXERIL) 10 mg Oral Tablet Take 10 mg by mouth Three times a day    diclofenac sodium (VOLTAREN) 75 mg Oral Tablet, Delayed Release (E.C.) Take 75 mg by mouth Once a day    levomilnacipran (FETZIMA) 40 mg Oral Capsule,Sustained Action 24 hr Take 40 mg by mouth Once a day    losartan (COZAAR) 25 mg Oral Tablet Take 25 mg by mouth Once a day  nitroglycerin (NITROSTAT) 0.3 mg Sublingual Tablet, Sublingual 1 Tab (0.3 mg total) by Sublingual route Every 5 minutes as needed for Chest pain for 3 doses over 15 minutes    pantoprazole (PROTONIX) 40 mg Oral Tablet, Delayed Release (E.C.) Take 1 Tab (40 mg total) by mouth Once a day       Allergies:  Allergies   Allergen Reactions    Capoten [Captopril]     Demerol [Meperidine]     Dilaudid [Hydromorphone]        Review of Systems:  All other systems reviewed and found to be negative.    Physical Exam:  Blood pressure 142/86, pulse 72, temperature 35.8 C (96.4 F), temperature source Thermal Scan, height 1.651 m (5\' 5" ), weight 90.266 kg (199 lb).  Body mass index is 33.12 kg/(m^2).  General Appearance: Pleasant, cooperative, healthy, and in no acute distress.  Eyes: Conjunctivae/corneas clear, PERRLA, EOM's intact.  Head and Face:  Normocephalic, atraumatic.  Face symmetric, no obvious lesions.   Pinnae: Normal shape and position.   External auditory canals:  Patent without inflammation or drainage.  Tympanic membranes:  Intact, translucent, midposition, middle ear aerated.  Nose:  External pyramid midline. Septum midline. Mucosa normal. No purulence, polyps, or crusts.   Oral Cavity/Oropharynx: No mucosal lesions, masses, or pharyngeal asymmetry.  Neck:  No palpable thyroid, salivary gland, or neck masses.  Heme/Lymph:  No cervical adenopathy.  Cardiovascular:  Good perfusion of upper extremities.  No cyanosis of the hands or fingers.  Lungs: No apparent stridorous breathing. No acute distress.  Skin: Skin warm and dry.  Neurologic: Cranial nerves:  grossly intact.  Psychiatric:  Alert and oriented x 3.    Procedure: Dix-Hallpike test   A Dix-Hallpike test was performed in clinic today. The patient was placed on the examining table in the seated position. The patient's head was turned to the right and moved to the supine position with the following findings: subjective dizziness:no; rotary nystagmus: no. This test was repeated with the head turned to the left with the following findings: subjective dizziness: no; rotary nystagmus: no. Negative for BPPV. Patient did experience lightheadedness when going from a lying to a sitting position.    Data Reviewed:   10/21/2013 Audiogram/Tympanogram  Impression: Type A tymp, AU. Mild hearing with a moderate dip at 6k Hz, AU.  Recommendation: 1) ENT f/u 2) repeat PRN.  MJMagness,Au.D CCC/A    Assessment:    ICD-9-CM    1. Dizziness and giddiness 780.4 AMB CONSULT/REFERRAL [facility]     DIX HALPIKE (AMB ONLY)       Plan: Patient has a mild bilateral SNHL with properly functioning TMs without signs of fluid or infection. Dix-Hallpike testing was negative for BPPV. No ear related cause determined for patient's dizziness. Patient did experience lightheadedness when going from a lying to a sitting position.  Recommend patient continue care with cardiologist - Dr. Annabell SabalJalisi - and PCP - Dr. Tharon AquasGlassford - for evaluation/treatment of dizziness. Discussed sitting/standing for a few seconds to a minute before moving and adequate daily water intake. Follow up as needed.    Orders Placed This Encounter    Dix Halpike[[id number]]       Return if symptoms worsen or fail to improve.    Sandi CarneAngela Ettinger, PA-C  Supervising physician: Alric SetonSohrab Aleksander Edmiston, MD  [facility]  [facility]          I reviewed the PA's note.  I agree with the findings and plan  of care as documented in the PA's note.  Any exceptions/additions are edited/noted.    Alric SetonSohrab Jaylynne Birkhead, MD 10/30/2013, 10:23

## 2013-10-23 NOTE — Procedures (Addendum)
Procedure: Dix-Hallpike test   A Dix-Hallpike test was performed in clinic today. The patient was placed on the examining table in the seated position. The patient's head was turned to the right and moved to the supine position with the following findings: subjective dizziness:no; rotary nystagmus: no. This test was repeated with the head turned to the left with the following findings: subjective dizziness: no; rotary nystagmus: no. Negative for BPPV. Patient did experience lightheadedness when going from a lying to a sitting position.      I reviewed the PA's note.  I agree with the findings and plan of care as documented in the PA's note.  Any exceptions/additions are edited/noted.    Alric SetonSohrab Chivas Notz, MD 10/30/2013, 10:24

## 2013-11-09 ENCOUNTER — Encounter (HOSPITAL_BASED_OUTPATIENT_CLINIC_OR_DEPARTMENT_OTHER): Payer: Self-pay

## 2013-11-09 ENCOUNTER — Emergency Department (HOSPITAL_BASED_OUTPATIENT_CLINIC_OR_DEPARTMENT_OTHER): Payer: BC Managed Care – PPO

## 2013-11-09 ENCOUNTER — Emergency Department (HOSPITAL_BASED_OUTPATIENT_CLINIC_OR_DEPARTMENT_OTHER)
Admission: EM | Admit: 2013-11-09 | Discharge: 2013-11-09 | Disposition: A | Discharge status: Home / Self Care | Payer: BC Managed Care – PPO | Attending: Emergency Medicine | Admitting: Emergency Medicine

## 2013-11-09 DIAGNOSIS — N390 Urinary tract infection, site not specified: Secondary | ICD-10-CM | POA: Insufficient documentation

## 2013-11-09 DIAGNOSIS — I2510 Atherosclerotic heart disease of native coronary artery without angina pectoris: Secondary | ICD-10-CM | POA: Insufficient documentation

## 2013-11-09 DIAGNOSIS — I10 Essential (primary) hypertension: Secondary | ICD-10-CM | POA: Insufficient documentation

## 2013-11-09 DIAGNOSIS — R209 Unspecified disturbances of skin sensation: Secondary | ICD-10-CM | POA: Insufficient documentation

## 2013-11-09 DIAGNOSIS — E669 Obesity, unspecified: Secondary | ICD-10-CM | POA: Insufficient documentation

## 2013-11-09 DIAGNOSIS — E876 Hypokalemia: Secondary | ICD-10-CM | POA: Insufficient documentation

## 2013-11-09 DIAGNOSIS — R509 Fever, unspecified: Secondary | ICD-10-CM | POA: Insufficient documentation

## 2013-11-09 LAB — POCT MACROSCOPIC URINALYSIS - BMC ONLY
BILIRUBIN,URINE: NEGATIVE mg/dL
KETONE,URINE: NEGATIVE mg/dL
NITRITES: NEGATIVE
NITRITES: NEGATIVE
PH,URINE: 6.5 (ref 5.0–7.5)
PH,URINE: 6.5 (ref 5.0–7.5)
PROTEIN,URINE: 30 mg/dL — AB
SPECIFIC GRAVITY,URINE: 1.025 — AB (ref 1.005–1.020)
UROBILINOGEN,URINE: 4 mg/dL — AB (ref 0.2–1.0)

## 2013-11-09 LAB — CBC
BASOPHILS %: 0.6 % (ref 0.0–2.5)
EOSINOPHIL #: 0.4 K/uL (ref 0.0–0.5)
EOSINOPHIL %: 2.6 % (ref 0.0–5.2)
HCT: 34.9 % — ABNORMAL LOW (ref 36.0–45.0)
HGB: 11.2 g/dL — ABNORMAL LOW (ref 12.0–15.5)
LYMPHOCYTE #: 1.3 10*3/uL (ref 0.7–3.2)
LYMPHOCYTE %: 7.7 % — ABNORMAL LOW (ref 15.0–43.0)
MCH: 23.9 pg — ABNORMAL LOW (ref 28.3–34.3)
MCHC: 32.1 g/dL (ref 32.0–36.0)
MONOCYTE #: 1 10*3/uL — ABNORMAL HIGH (ref 0.2–0.9)
MONOCYTE %: 5.8 % (ref 4.8–12.0)
MPV: 7.7 fL (ref 7.4–10.5)
MPV: 7.7 fL (ref 7.4–10.5)
NRBC ABSOLUTE: 0.01 10*3/uL (ref 0–0.02)
NRBC: 0 /100 WBC (ref 0–0.3)
PLATELET COUNT: 313 K/uL (ref 150–450)
PMN #: 14.3 10*3/uL — ABNORMAL HIGH (ref 1.5–6.5)
PMN %: 83.3 % — ABNORMAL HIGH (ref 43.0–76.0)
RBC: 4.69 M/uL (ref 4.00–5.10)
RDW: 17.8 % — ABNORMAL HIGH (ref 10.5–14.5)
WBC: 17.1 K/uL (ref 4.0–11.0)

## 2013-11-09 LAB — BASIC METABOLIC PROFILE - BMC/JMC ONLY
ANION GAP: 9 mmol/L (ref 3–11)
BUN: 18 mg/dL (ref 6–22)
CALCIUM: 8.9 mg/dL (ref 8.5–10.5)
CARBON DIOXIDE: 32 mmol/L (ref 22–32)
CHLORIDE: 102 mmol/L (ref 101–111)
CREATININE: 0.83 mg/dL (ref 0.53–1.00)
ESTIMATED GLOMERULAR FILTRATION RATE: >60 mL/min (ref >60)
GLUCOSE: 115 mg/dL — ABNORMAL HIGH (ref 70–110)
POTASSIUM: 2.5 mmol/L — ABNORMAL LOW (ref 3.5–5.0)
SODIUM: 143 mmol/L (ref 136–145)

## 2013-11-09 LAB — MICROSCOPIC URINE (ED USE ONLY) - BMC ONLY

## 2013-11-09 LAB — MORPHOLOGY SCAN - BMC/JMC ONLY: PLATELET ESTIMATE: ADEQUATE

## 2013-11-09 LAB — FOLATE
FOLATE: 6.2 ng/mL (ref >4.5)
FOLATE: 6.2 ng/mL (ref >4.5)

## 2013-11-09 LAB — TSH CASCADE,3RD GEN WITH REFLEX TO FT4: TSH CASCADE: 2.693 u[IU]/mL (ref 0.340–5.600)

## 2013-11-09 LAB — VITAMIN B12: VITAMIN B12: 208 pg/mL (ref 180–914)

## 2013-11-09 LAB — CREATINE KINASE (CK), TOTAL, SERUM OR PLASMA: CREATINE KINASE (CK): 115 IU/L (ref 0–230)

## 2013-11-09 MED ORDER — POTASSIUM CHLORIDE ER 10 MEQ CAPSULE,EXTENDED RELEASE
60.00 meq | ORAL_CAPSULE | ORAL | Status: AC
Start: 2013-11-09 — End: 2013-11-09
  Administered 2013-11-09: 60 meq via ORAL
  Filled 2013-11-09: qty 6

## 2013-11-09 MED ORDER — CEFTRIAXONE 10 GRAM SOLUTION FOR INJECTION
2.00 g | INTRAMUSCULAR | Status: DC
Start: 2013-11-09 — End: 2013-11-09
  Filled 2013-11-09: qty 20

## 2013-11-09 MED ORDER — CEFDINIR 300 MG CAPSULE
600.0000 mg | ORAL_CAPSULE | ORAL | Status: AC
Start: 2013-11-09 — End: 2013-11-09
  Administered 2013-11-09: 600 mg via ORAL
  Filled 2013-11-09: qty 2

## 2013-11-09 MED ORDER — CEFDINIR 300 MG CAPSULE
600.0000 mg | ORAL_CAPSULE | Freq: Every day | ORAL | Status: AC
Start: 2013-11-10 — End: 2013-11-15

## 2013-11-09 MED ORDER — POTASSIUM CHLORIDE ER 10 MEQ CAPSULE,EXTENDED RELEASE
40.0000 meq | ORAL_CAPSULE | Freq: Every day | ORAL | Status: AC
Start: 2013-11-10 — End: 2013-11-12

## 2013-11-09 MED ORDER — ACETAMINOPHEN 500 MG TABLET
1000.00 mg | ORAL_TABLET | ORAL | Status: AC
Start: 2013-11-09 — End: 2013-11-09
  Administered 2013-11-09: 1000 mg via ORAL
  Filled 2013-11-09: qty 2

## 2013-11-09 MED ADMIN — potassium chloride ER 10 mEq capsule,extended release: ORAL | @ 20:00:00

## 2013-11-09 NOTE — Discharge Instructions (Signed)
Urinary Tract Infection  Urinary tract infections (UTIs) can develop anywhere along your urinary tract. Your urinary tract is your body's drainage system for removing wastes and extra water. Your urinary tract includes two kidneys, two ureters, a bladder, and a urethra. Your kidneys are a pair of bean-shaped organs. Each kidney is about the size of your fist. They are located below your ribs, one on each side of your spine.  CAUSES  Infections are caused by microbes, which are microscopic organisms, including fungi, viruses, and bacteria. These organisms are so small that they can only be seen through a microscope. Bacteria are the microbes that most commonly cause UTIs.  SYMPTOMS   Symptoms of UTIs may vary by age and gender of the patient and by the location of the infection. Symptoms in young women typically include a frequent and intense urge to urinate and a painful, burning feeling in the bladder or urethra during urination. Older women and men are more likely to be tired, shaky, and weak and have muscle aches and abdominal pain. A fever may mean the infection is in your kidneys. Other symptoms of a kidney infection include pain in your back or sides below the ribs, nausea, and vomiting.  DIAGNOSIS  To diagnose a UTI, your caregiver will ask you about your symptoms. Your caregiver also will ask to provide a urine sample. The urine sample will be tested for bacteria and white blood cells. White blood cells are made by your body to help fight infection.  TREATMENT   Typically, UTIs can be treated with medication. Because most UTIs are caused by a bacterial infection, they usually can be treated with the use of antibiotics. The choice of antibiotic and length of treatment depend on your symptoms and the type of bacteria causing your infection.  HOME CARE INSTRUCTIONS   If you were prescribed antibiotics, take them exactly as your caregiver instructs you. Finish the medication even if you feel better after you  have only taken some of the medication.   Drink enough water and fluids to keep your urine clear or pale yellow.   Avoid caffeine, tea, and carbonated beverages. They tend to irritate your bladder.   Empty your bladder often. Avoid holding urine for long periods of time.   Empty your bladder before and after sexual intercourse.   After a bowel movement, women should cleanse from front to back. Use each tissue only once.  SEEK MEDICAL CARE IF:    You have back pain.   You develop a fever.   Your symptoms do not begin to resolve within 3 days.  SEEK IMMEDIATE MEDICAL CARE IF:    You have severe back pain or lower abdominal pain.   You develop chills.   You have nausea or vomiting.   You have continued burning or discomfort with urination.  MAKE SURE YOU:    Understand these instructions.   Will watch your condition.   Will get help right away if you are not doing well or get worse.  Document Released: 03/22/2005 Document Revised: 12/12/2011 Document Reviewed: 07/21/2011  [facility] Patient Information 2015 [place]. This information is not intended to replace advice given to you by your health care provider. Make sure you discuss any questions you have with your health care provider.  Paresthesia  Paresthesia is an abnormal burning or prickling sensation. This sensation is generally felt in the hands, arms, legs, or feet. However, it may occur in any part of the body. It is usually  not painful. The feeling may be described as:   Tingling or numbness.   "Pins and needles."   Skin crawling.   Buzzing.   Limbs "falling asleep."   Itching.  Most people experience temporary (transient) paresthesia at some time in their lives.  CAUSES   Paresthesia may occur when you breathe too quickly (hyperventilation). It can also occur without any apparent cause. Commonly, paresthesia occurs when pressure is placed on a nerve. The feeling quickly goes away once the pressure is removed. For some people,  however, paresthesia is a long-lasting (chronic) condition caused by an underlying disorder. The underlying disorder may be:   A traumatic, direct injury to nerves. Examples include a:   Broken (fractured) neck.   Fractured skull.   A disorder affecting the brain and spinal cord (central nervous system). Examples include:   Transverse myelitis.   Encephalitis.   Transient ischemic attack.   Multiple sclerosis.   Stroke.   Tumor or blood vessel problems, such as an arteriovenous malformation pressing against the brain or spinal cord.   A condition that damages the peripheral nerves (peripheral neuropathy). Peripheral nerves are not part of the brain and spinal cord. These conditions include:   Diabetes.   Peripheral vascular disease.   Nerve entrapment syndromes, such as carpal tunnel syndrome.   Shingles.   Hypothyroidism.   Vitamin B12 deficiencies.   Alcoholism.   Heavy metal poisoning (lead, arsenic).   Rheumatoid arthritis.   Systemic lupus erythematosus.  DIAGNOSIS   Your caregiver will attempt to find the underlying cause of your paresthesia. Your caregiver may:   Take your medical history.   Perform a physical exam.   Order various lab tests.   Order imaging tests.  TREATMENT   Treatment for paresthesia depends on the underlying cause.  HOME CARE INSTRUCTIONS   Avoid drinking alcohol.   You may consider massage or acupuncture to help relieve your symptoms.   Keep all follow-up appointments as directed by your caregiver.  SEEK IMMEDIATE MEDICAL CARE IF:    You feel weak.   You have trouble walking or moving.   You have problems with speech or vision.   You feel confused.   You cannot control your bladder or bowel movements.   You feel numbness after an injury.   You faint.   Your burning or prickling feeling gets worse when walking.   You have pain, cramps, or dizziness.   You develop a rash.  MAKE SURE YOU:   Understand these instructions.   Will watch your  condition.   Will get help right away if you are not doing well or get worse.  Document Released: 06/02/2002 Document Revised: 09/04/2011 Document Reviewed: 03/03/2011  [facility] Patient Information 2015 [place], [place]. This information is not intended to replace advice given to you by your health care provider. Make sure you discuss any questions you have with your health care provider.  Fever, Adult  A fever is a higher than normal body temperature. In an adult, an oral temperature around 98.6 F (37 C) is considered normal. A temperature of 100.4 F (38 C) or higher is generally considered a fever. Mild or moderate fevers generally have no long-term effects and often do not require treatment. Extreme fever (greater than or equal to 106 F or 41.1 C) can cause seizures. The sweating that may occur with repeated or prolonged fever may cause dehydration. Elderly people can develop confusion during a fever.  A measured temperature can vary with:  Age.   Time of day.   Method of measurement (mouth, underarm, rectal, or ear).  The fever is confirmed by taking a temperature with a thermometer. Temperatures can be taken different ways. Some methods are accurate and some are not.   An oral temperature is used most commonly. Electronic thermometers are fast and accurate.   An ear temperature will only be accurate if the thermometer is positioned as recommended by the manufacturer.   A rectal temperature is accurate and done for those adults who have a condition where an oral temperature cannot be taken.   An underarm (axillary) temperature is not accurate and not recommended.  Fever is a symptom, not a disease.   CAUSES    Infections commonly cause fever.   Some noninfectious causes for fever include:   Some arthritis conditions.   Some thyroid or adrenal gland conditions.   Some immune system conditions.   Some types of cancer.   A medicine reaction.   High doses of certain street drugs such as  methamphetamine.   Dehydration.   Exposure to high outside or room temperatures.   Occasionally, the source of a fever cannot be determined. This is sometimes called a "fever of unknown origin" (FUO).   Some situations may lead to a temporary rise in body temperature that may go away on its own. Examples are:   Childbirth.   Surgery.   Intense exercise.  HOME CARE INSTRUCTIONS    Take appropriate medicines for fever. Follow dosing instructions carefully. If you use acetaminophen to reduce the fever, be careful to avoid taking other medicines that also contain acetaminophen. Do not take aspirin for a fever if you are younger than age 65. There is an association with Reye's syndrome. Reye's syndrome is a rare but potentially deadly disease.   If an infection is present and antibiotics have been prescribed, take them as directed. Finish them even if you start to feel better.   Rest as needed.   Maintain an adequate fluid intake. To prevent dehydration during an illness with prolonged or recurrent fever, you may need to drink extra fluid.Drink enough fluids to keep your urine clear or pale yellow.   Sponging or bathing with room temperature water may help reduce body temperature. Do not use ice water or alcohol sponge baths.   Dress comfortably, but do not over-bundle.  SEEK MEDICAL CARE IF:    You are unable to keep fluids down.   You develop vomiting or diarrhea.   You are not feeling at least partly better after 3 days.   You develop new symptoms or problems.  SEEK IMMEDIATE MEDICAL CARE IF:    You have shortness of breath or trouble breathing.   You develop excessive weakness.   You are dizzy or you faint.   You are extremely thirsty or you are making little or no urine.   You develop new pain that was not there before (such as in the head, neck, chest, back, or abdomen).   You have persistant vomiting and diarrhea for more than 1 to 2 days.   You develop a stiff neck or your eyes become  sensitive to light.   You develop a skin rash.   You have a fever or persistent symptoms for more than 2 to 3 days.   You have a fever and your symptoms suddenly get worse.  MAKE SURE YOU:    Understand these instructions.   Will watch your condition.   Will get help  right away if you are not doing well or get worse.  Document Released: 12/06/2000 Document Revised: 09/04/2011 Document Reviewed: 04/13/2011  [facility] Patient Information 2015 [place], [place]. This information is not intended to replace advice given to you by your health care provider. Make sure you discuss any questions you have with your health care provider.  Hypokalemia  Hypokalemia means that the amount of potassium in the blood is lower than normal.Potassium is a chemical, called an electrolyte, that helps regulate the amount of fluid in the body. It also stimulates muscle contraction and helps nerves function properly.Most of the body's potassium is inside of cells, and only a very small amount is in the blood. Because the amount in the blood is so small, minor changes can be life-threatening.  CAUSES   Antibiotics.   Diarrhea or vomiting.   Using laxatives too much, which can cause diarrhea.   Chronic kidney disease.   Water pills (diuretics).   Eating disorders (bulimia).   Low magnesium level.   Sweating a lot.  SIGNS AND SYMPTOMS   Weakness.   Constipation.   Fatigue.   Muscle cramps.   Mental confusion.   Skipped heartbeats or irregular heartbeat (palpitations).   Tingling or numbness.  DIAGNOSIS   Your health care provider can diagnose hypokalemia with blood tests. In addition to checking your potassium level, your health care provider may also check other lab tests.  TREATMENT  Hypokalemia can be treated with potassium supplements taken by mouth or adjustments in your current medicines. If your potassium level is very low, you may need to get potassium through a vein (IV) and be monitored in the hospital. A diet high  in potassium is also helpful. Foods high in potassium are:   Nuts, such as peanuts and pistachios.   Seeds, such as sunflower seeds and pumpkin seeds.   Peas, lentils, and lima beans.   Whole grain and bran cereals and breads.   Fresh fruit and vegetables, such as apricots, avocado, bananas, cantaloupe, kiwi, oranges, tomatoes, asparagus, and potatoes.   Orange and tomato juices.   Red meats.   Fruit yogurt.  HOME CARE INSTRUCTIONS   Take all medicines as prescribed by your health care provider.   Maintain a healthy diet by including nutritious food, such as fruits, vegetables, nuts, whole grains, and lean meats.   If you are taking a laxative, be sure to follow the directions on the label.  SEEK MEDICAL CARE IF:   Your weakness gets worse.   You feel your heart pounding or racing.   You are vomiting or having diarrhea.   You are diabetic and having trouble keeping your blood glucose in the normal range.  SEEK IMMEDIATE MEDICAL CARE IF:   You have chest pain, shortness of breath, or dizziness.   You are vomiting or having diarrhea for more than 2 days.   You faint.  MAKE SURE YOU:    Understand these instructions.   Will watch your condition.   Will get help right away if you are not doing well or get worse.  Document Released: 06/12/2005 Document Revised: 04/02/2013 Document Reviewed: 12/13/2012  [facility] Patient Information 2015 [place], [place]. This information is not intended to replace advice given to you by your health care provider. Make sure you discuss any questions you have with your health care provider.

## 2013-11-09 NOTE — ED Nurses Note (Signed)
Updated pt/family on status of testing that has been performed. Informed them that I do have some results back but the doctor will be around to discuss with them the results personally once all the results are back. Side rails are up, call bell in reach pt in no signs of distress and resting in stretcher at this time. Checked on pt pain status, provided comfort measures as needed and asked if they needed to use the restroom prior to leaving the room.

## 2013-11-09 NOTE — ED Provider Notes (Signed)
Caprice RedGentle, Kylo Gavin, MD  [facility]  Emergency Department Visit Note    Date:  11/09/2013  Primary care provider:  Sid FalconJustin Glassford, MD  Means of arrival:  private car  History obtained from: patient  History limited by: none    Chief Complaint:  Numbness    HISTORY OF PRESENT ILLNESS     [name], date of birth 10-11-1950, is a 63 y.o. female who presents to the Emergency Department complaining of mild to moderate constant numbness throughout her body below her neck which began an hour ago. She states that the numbness began in her hands. She states she has less sensation than normal but is not completely numb. She states that she does not feel numbness in her head or chest.  The patient has also been experiencing chills and body aches in all areas of numbness. The patient denies weakness, joint swelling, chest pain, shortness of breath, anxiety, stress,  nausea, headaches, vomiting, diarrhea, rashes, hematuria, hematochezia, changes in speech, swallowing, or vision.  She has a history of heart catheretization with the placement of one stent.     REVIEW OF SYSTEMS     The pertinent positive and negative symptoms are as per HPI. All other systems reviewed and are negative.     PATIENT HISTORY     Past Medical History:  Past Medical History   Diagnosis Date    HTN (hypertension)     Coronary artery disease     Wears glasses     Hyperlipemia     Obesity     Mixed hyperlipidemia 07/30/2013     Past Surgical History:  Past Surgical History   Procedure Laterality Date    Hx heart catheterization       stent in June 2014    Hx hysterectomy      Hx tmj arthotomy  x2    Hx laminoplasty      Hx cervical diskectomy       Family History:  Family History   Problem Relation Age of Onset    Hypertension Sister     Diabetes Sister     Hypertension Maternal Grandmother     Coronary Artery Disease Maternal Grandmother      elderly    Coronary Artery Disease Maternal Uncle      elderly     Social  History:  History   Substance Use Topics    Smoking status: Never Smoker     Smokeless tobacco: Not on file    Alcohol Use: No     History   Drug Use No     Medications:  Previous Medications    ASPIRIN 81 MG ORAL TABLET, CHEWABLE    Take 1 Tab (81 mg total) by mouth Once a day    CLOPIDOGREL (PLAVIX) 75 MG ORAL TABLET    TAKE 1 TABLET DAILY    CRESTOR 20 MG ORAL TABLET    TAKE 1 TABLET EVERY EVENING    CYCLOBENZAPRINE (FLEXERIL) 10 MG ORAL TABLET    Take 10 mg by mouth Three times a day    DICLOFENAC SODIUM (VOLTAREN) 75 MG ORAL TABLET, DELAYED RELEASE (E.C.)    Take 75 mg by mouth Once a day    LEVOMILNACIPRAN (FETZIMA) 40 MG ORAL CAPSULE,SUSTAINED ACTION 24 HR    Take 40 mg by mouth Once a day    LOSARTAN (COZAAR) 25 MG ORAL TABLET    Take 25 mg by mouth Once a day    NITROGLYCERIN (  NITROSTAT) 0.3 MG SUBLINGUAL TABLET, SUBLINGUAL    1 Tab (0.3 mg total) by Sublingual route Every 5 minutes as needed for Chest pain for 3 doses over 15 minutes    PANTOPRAZOLE (PROTONIX) 40 MG ORAL TABLET, DELAYED RELEASE (E.C.)    Take 1 Tab (40 mg total) by mouth Once a day     Allergies:  Allergies   Allergen Reactions    Capoten [Captopril]     Demerol [Meperidine]     Dilaudid [Hydromorphone]      PHYSICAL EXAM     Vitals:  Filed Vitals:    11/09/13 1730   BP: 155/105   Pulse: 114   Temp: 37.3 C (99.2 F)   Resp: 16   SpO2: 99%     Pulse ox  99% on None (Room Air) interpreted by me as: Normal    Constitutional: Overweight, elderly female in no acute distress.  Head: Normocephalic and atraumatic.   ENT: Moist mucous membranes. No erythema or exudates in the oropharynx.  Eyes: EOM are normal. Pupils are equal, round, and reactive to light. No scleral icterus.   Neck: Neck supple. No meningismus.  Cardiovascular: Tachycardic rate. Normal S1 and S2. Exam reveals no gallop and no friction rub.  No murmur heard. 2 + pulses on all four extremities.     Pulmonary/Chest: Effort normal and breath sounds normal.   Abdominal: Soft.  No distension. There is no tenderness. No organomegaly. Normal bowel sounds present.  Back: There is no CVA tenderness.  Musculoskeletal: Normal range of motion. No edema and no extremity tenderness. No clubbing or cyanosis.  Lymphadenopathy: No cervical adenopathy.   Neurological: Patient is alert and oriented to person, place, and time. Cranial nerves II - XII intact. Normal speech. Normal finger to nose. Normal heel to shin. Strength and sensation are normal, except decreased sensation in both hands and feet. 2 + bicep and patellar DTRs bilaterally.   Skin: Skin is warm and dry. No rash noted.      DIAGNOSTIC STUDIES     Labs:    Results for orders placed during the hospital encounter of 11/09/13   VITAMIN B12       Result Value Range    VITAMIN B12 208  180 - 914 pg/mL   TSH CASCADE,3RD GEN WITH REFLEX TO FT4 - [facility] ONLY       Result Value Range    TSH CASCADE 2.693  0.340 - 5.600 uIU/mL   FOLATE       Result Value Range    FOLATE 6.2  >4.5 ng/mL   CBC       Result Value Range    WBC 17.1 (*) 4.0 - 11.0 K/uL    RBC 4.69  4.00 - 5.10 M/uL    HGB 11.2 (*) 12.0 - 15.5 g/dL    HCT 16.1 (*) 09.6 - 45.0 %    MCV 74.3 (*) 82.0 - 97.0 fL    MCH 23.9 (*) 28.3 - 34.3 pg    MCHC 32.1  32.0 - 36.0 g/dL    RDW 04.5 (*) 40.9 - 14.5 %    PLATELET COUNT 313  150 - 450 K/uL    MPV 7.7  7.4 - 10.5 fL    NRBC 0  0 - 0.3 /100 WBC    NRBC ABSOLUTE 0.01  0 - 0.02 K/uL    PMN % 83.3 (*) 43.0 - 76.0 %    LYMPHOCYTE % 7.7 (*) 15.0 - 43.0 %  MONOCYTE % 5.8  4.8 - 12.0 %    EOSINOPHIL % 2.6  0.0 - 5.2 %    BASOPHILS % 0.6  0.0 - 2.5 %    PMN # 14.3 (*) 1.5 - 6.5 K/uL    LYMPHOCYTE # 1.3  0.7 - 3.2 K/uL    MONOCYTE # 1.0 (*) 0.2 - 0.9 K/uL    EOSINOPHIL # 0.4  0.0 - 0.5 K/uL    BASOPHIL # 0.1  0.0 - 0.1 K/uL   BASIC METABOLIC PROFILE - [facility] ONLY       Result Value Range    GLUCOSE 115 (*) 70 - 110 mg/dL    BUN 18  6 - 22 mg/dL    CREATININE 1.61  0.96 - 1.00 mg/dL    ESTIMATED GLOMERULAR FILTRATION RATE >60  >60 ml/min    SODIUM  143  136 - 145 mmol/L    POTASSIUM 2.5 (*) 3.5 - 5.0 mmol/L    CHLORIDE 102  101 - 111 mmol/L    CARBON DIOXIDE 32  22 - 32 mmol/L    ANION GAP 9  3 - 11 mmol/L    CALCIUM 8.9  8.5 - 10.5 mg/dL   CREATINE KINASE (CK), TOTAL, SERUM       Result Value Range    CREATINE KINASE (CK) 115  0 - 230 IU/L   MORPHOLOGY SCAN - [facility] ONLY       Result Value Range    PLATELET ESTIMATE ADEQUATE  ADEQUATE    POIKILOCYTOSIS 1+      ANISOCYTOSIS 1+      MICROCYTOSIS 1+     POCT MACROSCOPIC URINALYSIS - [facility] ONLY       Result Value Range    SOURCE, URINE CC      COLOR,URINE YELLOW  YELLOW    APPEARANCE,URINE CLEAR  CLEAR    GLUCOSE,URINE NEGATIVE  NEGATIVE mg/dL    BILIRUBIN,URINE NEGATIVE  NEGATIVE mg/dL    KETONE,URINE NEGATIVE  NEGATIVE mg/dL    SPECIFIC GRAVITY,URINE 1.025 (*) 1.005 - 1.020    BLOOD, URINE TRACE (*) NEGATIVE    PH,URINE 6.5  5.0 - 7.5    PROTEIN,URINE 30 (*) NEGATIVE mg/dL    UROBILINOGEN,URINE 4.0 (*) 0.2 - 1.0 mg/dL    NITRITES NEGATIVE  NEGATIVE    LEUKOCYTE ESTERASE, URINE SMALL (*) NEGATIVE   MICROSCOPIC URINE (ED USE ONLY) - [facility] ONLY       Result Value Range    WBC URINE 30-50 (*) 0 - 2 /hpf    RBC,URINE 2-5 (*) 0 - 2 /hpf    SQUAMOUS EPITHELIAL CELLS,UR 0-2  0 - 2 /hpf    RENAL EPITHELIAL CELLS,UR 0-2  0 - 2 /hpf    BACTERIA,URINE SLIGHT (*) NONE    MUCUS,URINE SLIGHT  NONE-SLT    HYALINE CAST,URINE 0-2  0 - 2 /lpf      Labs reviewed and interpreted by me.    Radiology:    XR CHEST AP PORTABLE - Negative normal  Radiological imaging interpreted independently reviewed by me.     ----------------------------------------------------------------------  URINE CULTURE - [facility] ONLY - Results pending    ED PROGRESS NOTE / MEDICAL DECISION MAKING     Old records reviewed by me:  Upon review of her previous labs, I noted that the patient had normal cholesterol panel in April 13. A1C was 5.9,  thyroid cascade was normal in March. She had a 48 hour helter monitor in February that showed frequent episodes  of sinus  tachycardia and occasional premature beats.    Orders Placed This Encounter    URINE CULTURE - [facility] ONLY    XR CHEST AP PORTABLE    Vitamin B12    TSH CASCADE,3RD GEN WITH REFLEX TO FT4 - [facility] ONLY    Folate    CBC    BASIC METABOLIC PROFILE - [facility] ONLY    Creatine Kinase Total, CK    MORPHOLOGY SCAN - [facility] ONLY    POCT MACROSCOPIC URINALYSIS - [facility] ONLY    MICROSCOPIC URINE (ED USE ONLY) - [facility] ONLY    acetaminophen (TYLENOL) tablet    potassium chloride (MICRO K) 10 mEq capsule    cefTRIAXone (ROCEPHIN) 2 g in NS 50 mL IVPB     5:52 PM - I explained to the patient and her family that I will be ordering chest x-ray, POCT and several other laboratory tests to further evaluate the patient's condition, as well as Tylenol PO medications, PO Micro K, and IV Rocephin to reduce her discomfort and treat her symptoms. The patient and her family are in agreement with this treatment plan at this time.    8:11 PM - Upon recheck, patient reports improvement in her symptoms after receiving the above listed medications.  She still has some numbness in her hands and feet of uncertain etiology.  She has no focal weakness to suggest a stroke.  She has reflexes present which makes Guillain Barre much less likely.  I advised her to monitor the numbness and return if it spreads up her arms/legs or she develops weakness, vision loss, slurred speech, trouble swallowing or trouble breathing.  If it just persists I recommended that she follow up with Dr. Vanessa Barbara (her neurologist).  The chills and body aches are due to the fever which appears to be coming from a UTI.  An IV could not be established so she was given a dose of omnicef instead and we will follow up on her urine culture results and treat accordingly.   I discussed the results of the patient's tests with her. I indicated to the patient that she is ready for discharge home. Diagnosis, disposition, and plan were discussed with the patient at length -- she is  in accordance with these. The patient was advised of signs and symptoms for immediate return to the ED, as well as proper use of prescriptions I will be providing. I recommended the patient eat yogurt twice a day along with her antibiotics. She is agreeable to this treatment plan at this time.  I will provided 2 more days of potassium and I recommended that she see her PCP in 2 days to have her potassium rechecked.  --------------------------------------------------------------  URINE CULTURE - [facility] ONLY - Results pending    Pre-Disposition Vitals:  Filed Vitals:    11/09/13 1730 11/09/13 1822 11/09/13 1909 11/09/13 1936   BP: 155/105  144/76    Pulse: 114  97    Temp: 37.3 C (99.2 F) 38.5 C (101.3 F)  38.4 C (101.1 F)   Resp: 16  20    SpO2: 99%  96%      CLINICAL IMPRESSION     1. Fever  2. UTI  3. Hypokalemia   4. Paresthesias    DISPOSITION/PLAN     Discharged        Prescriptions:     New Prescriptions    CEFDINIR (OMNICEF) 300 MG ORAL CAPSULE    Take 2 Caps (600 mg total) by  mouth Once a day for 5 days    POTASSIUM CHLORIDE (MICRO K) 10 MEQ ORAL CAPSULE, SUSTAINED RELEASE    Take 4 Caps (40 mEq total) by mouth Once a day for 2 days     Follow-Up:     Sid FalconGlassford, Justin, MD  [facility]  [address]  [address]  [place] [id number]  [phone]    Schedule an appointment as soon as possible for a visit in 2 days  to have your potassium rechecked and a follow up visit.  eat yogurt twice daily until you finish the antibiotics.  return to the ER immediately for any weakness, worsening numbness, slurred speech, vision loss, trouble breathing, or difficulty swallowing    Condition at Disposition: Stable      SCRIBE ATTESTATION STATEMENT  I Alisa GraffJuan Mercado, SCRIBE scribed for Caprice RedGentle, Deiondre Harrower, MD on 11/09/2013 at 5:45 PM.     Documentation assistance provided for Phyliss Hulick, Cristal Deerhristopher, MD  by Alisa GraffJuan Mercado, SCRIBE. Information recorded by the scribe was done at my direction and has been  reviewed and validated by me Maynor Mwangi, Cristal Deerhristopher, MD.

## 2013-11-09 NOTE — ED Nurses Note (Signed)
Written and verbal discharge instructions given to pt, they verbalize understanding. Pt ambulated to the exit.

## 2013-11-09 NOTE — ED Nurses Note (Signed)
Prior to medicating pt I asked the pt their name and birthday. Verified allergies and educated the pt on the possible side effects of the medication being administered. I informed the pt that I would return in 30 minutes to see if the medication has worked. Pt in no signs of distress. Call bell in reach and side rails up.

## 2013-11-09 NOTE — ED Nurses Note (Signed)
Verified with pt their name, birthday prior to labeling the specimen. Pt agreed that this information was correct.

## 2013-11-09 NOTE — ED Nurses Note (Signed)
Upon entering the room I introduced myself and explained my role to the pt/family. I verified with the pts name and date of birth. I educated the pt on testing and updated them on their status and next step to be taken. Pt is resting comfortably in stretcher in no signs of distress with call bell in reach. Upon leaving the room I asked if there was anything else I could do for them.

## 2013-11-09 NOTE — ED Nurses Note (Signed)
Pt ambulatory to the restroom to collect urine sample.

## 2013-11-09 NOTE — ED Nurses Note (Signed)
Feeling numb all over and achy all over.  Started about 1 hour ago.  No headache noted.  No chest pain noted.  Able to move w/o difficulty.  Never had this feeling before.  No injury noted.

## 2013-11-09 NOTE — ED Nurses Note (Signed)
Urine collected, labeled, and sent to POCT via tube system. Name and dob verified by patient.

## 2013-11-10 ENCOUNTER — Encounter (INDEPENDENT_AMBULATORY_CARE_PROVIDER_SITE_OTHER): Payer: BC Managed Care – PPO | Admitting: CARDIOVASCULAR DISEASE

## 2013-11-11 LAB — URINE CULTURE

## 2013-12-01 ENCOUNTER — Other Ambulatory Visit (INDEPENDENT_AMBULATORY_CARE_PROVIDER_SITE_OTHER): Payer: Self-pay | Admitting: CARDIOVASCULAR DISEASE

## 2013-12-11 ENCOUNTER — Other Ambulatory Visit (INDEPENDENT_AMBULATORY_CARE_PROVIDER_SITE_OTHER): Payer: Self-pay | Admitting: CARDIOVASCULAR DISEASE

## 2013-12-11 ENCOUNTER — Encounter (INDEPENDENT_AMBULATORY_CARE_PROVIDER_SITE_OTHER): Payer: Self-pay

## 2013-12-11 MED ORDER — CLOPIDOGREL 75 MG TABLET
75.0000 mg | ORAL_TABLET | Freq: Every day | ORAL | Status: DC
Start: 2013-12-11 — End: 2013-12-12

## 2013-12-11 NOTE — Progress Notes (Signed)
Lab Results were sent to PCP for further evaluation.

## 2013-12-12 ENCOUNTER — Other Ambulatory Visit (INDEPENDENT_AMBULATORY_CARE_PROVIDER_SITE_OTHER): Payer: Self-pay | Admitting: CARDIOVASCULAR DISEASE

## 2014-01-30 ENCOUNTER — Other Ambulatory Visit (INDEPENDENT_AMBULATORY_CARE_PROVIDER_SITE_OTHER): Payer: Self-pay | Admitting: CARDIOVASCULAR DISEASE

## 2014-03-04 ENCOUNTER — Other Ambulatory Visit (INDEPENDENT_AMBULATORY_CARE_PROVIDER_SITE_OTHER): Payer: Self-pay | Admitting: CARDIOVASCULAR DISEASE

## 2014-03-09 ENCOUNTER — Other Ambulatory Visit (INDEPENDENT_AMBULATORY_CARE_PROVIDER_SITE_OTHER): Payer: Self-pay | Admitting: CARDIOVASCULAR DISEASE

## 2014-03-10 ENCOUNTER — Encounter (INDEPENDENT_AMBULATORY_CARE_PROVIDER_SITE_OTHER): Payer: Self-pay

## 2014-03-10 NOTE — Progress Notes (Signed)
This patient called from [place]. Is visiting there & made a visit to ER for angina. She was told after return home, to follow up with cardiologist. Dr Annabell Sabal is out of office for next few weeks.  Pt was scheduled for appoint to see him in October.  It is recommended she follow up with her PCP for ER follow-up when she returns. If symptoms worsen or continue, she should follow up here in our ER for eval. The will contact cardiologist on-call as needed.  I sent request to ER there in [place] to have them send [place] her ER/hospital record.  [id number] Atten Tamela Oddi. Also, a request for refill on her 2 BP meds was forwarded to her PCP yesterday.[name] Ludwig, LPN  9/81/1914, 14:31

## 2014-03-10 NOTE — Telephone Encounter (Signed)
Please contact PCP Dr. Tharon Aquas for request. Dr. Annabell Sabal is out of the office until the end of the month. Thanks

## 2014-04-02 ENCOUNTER — Other Ambulatory Visit: Payer: Self-pay

## 2014-04-15 ENCOUNTER — Encounter (INDEPENDENT_AMBULATORY_CARE_PROVIDER_SITE_OTHER): Payer: Self-pay | Admitting: CARDIOVASCULAR DISEASE

## 2014-04-16 ENCOUNTER — Ambulatory Visit (INDEPENDENT_AMBULATORY_CARE_PROVIDER_SITE_OTHER): Payer: BC Managed Care – PPO | Admitting: CARDIOVASCULAR DISEASE

## 2014-04-16 ENCOUNTER — Encounter (INDEPENDENT_AMBULATORY_CARE_PROVIDER_SITE_OTHER): Payer: Self-pay | Admitting: CARDIOVASCULAR DISEASE

## 2014-04-16 VITALS — BP 142/98 | HR 112 | Resp 16 | Ht 65.0 in | Wt 199.8 lb

## 2014-04-16 DIAGNOSIS — I2510 Atherosclerotic heart disease of native coronary artery without angina pectoris: Secondary | ICD-10-CM

## 2014-04-16 DIAGNOSIS — R000 Tachycardia, unspecified: Secondary | ICD-10-CM

## 2014-04-16 DIAGNOSIS — I471 Supraventricular tachycardia: Secondary | ICD-10-CM

## 2014-04-16 DIAGNOSIS — E876 Hypokalemia: Secondary | ICD-10-CM

## 2014-04-16 DIAGNOSIS — I10 Essential (primary) hypertension: Secondary | ICD-10-CM

## 2014-04-16 DIAGNOSIS — E782 Mixed hyperlipidemia: Secondary | ICD-10-CM

## 2014-04-16 MED ORDER — NITROGLYCERIN 0.3 MG SUBLINGUAL TABLET
0.3000 mg | SUBLINGUAL_TABLET | SUBLINGUAL | Status: DC | PRN
Start: 2014-04-16 — End: 2015-09-25

## 2014-04-16 MED ORDER — METOPROLOL TARTRATE 100 MG TABLET
150.0000 mg | ORAL_TABLET | Freq: Two times a day (BID) | ORAL | Status: DC
Start: 2014-04-16 — End: 2014-10-20

## 2014-04-16 MED ORDER — SPIRONOLACTONE 25 MG TABLET
25.0000 mg | ORAL_TABLET | Freq: Every day | ORAL | Status: DC
Start: 2014-04-16 — End: 2015-02-18

## 2014-04-16 NOTE — Progress Notes (Signed)
[facility]                                                                              PROGRESS NOTE    PATIENT NAME: [name], [name]  HOSPITAL NUMBER:[id number]  DATE OF SERVICE:04/16/2014  DATE OF BIRTH: 31-Jul-1950    PRIMARY CARE PHYSICIAN:  Sid FalconJustin Glassford, MD    REASON FOR CLINIC VISIT:    1.  Recent hospital stay [facility] in [place] for chest pain.  2.  History of sinus tachycardia.  3.  Hypertension.     SUBJECTIVE:  This is a 63 year old black female who comes to cardiology for a 7 month followup visit.  We last saw her in March 2015.  She has seen Dr. Vanessa BarbaraZamora for dizziness and tinnitus and also saw ENT who thought that her symptoms were more orthostatic than ear related.  She has had chronic dizziness as well as chronic tachycardia for years and has been on verapamil in the past.  A 48-hour Holter monitor showed frequent sinus tachycardia with occasional PVCs and PACs.  The sinus tachycardia was present 24% of times during the day.  Her symptoms have been inconsistent with any tachycardia or rhythm issues.    The patient stated that she was on vacation in [place] and was admitted to [facility] with chest pain.  We were able to get a stress test report from March 04, 2014, performed by Dr. Carma LairMichael Mallod, MD.  This revealed that she was able to walk for 4 minutes and 20 seconds into the stage 2 of standard Bruce protocol, achieving 86% of age predicted heart rate.  Based on their protocol, they injected her with technetium at that time.  The nuclear portion of the stress test revealed a normal left ventricular systolic function of 56%.  They did not see any reversible ischemia or fixed defect consistent with previous scar, and this was thought to be at low risk stress test.    The patient stated that she was also found to be hypokalemic and was put on potassium supplement.  She has been taking potassium supplements  and trying to bananas and oranges to her diet in order to keep her potassium up.  She has not yet gotten her potassium rechecked since she is back to [place]; however, has an appointment next week to do this with Dr. Tharon AquasGlassford.    The patient denies any chest pain, shortness of breath, orthopnea, paroxysmal dyspnea.  Today she does not complain of any dizziness, syncope, presyncope or palpitations.  She does have mild lightheadedness when she changes her posture which is consistent with orthostatic hypotension.  She also admits that she "drinks tea all day long".  She thinks that she is "made of tea" because she drinks so much of it.  I have explained to her before that this will dehydrate her and will probably cause her orthostatic symptoms.    We did an EKG in the office today which reveals sinus tachycardia at a rate of 107 beats per minute.  She has nonspecific ST-T wave changes and a leftward axis.  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  PAST CARDIAC HISTORY:  The patient is known to have coronary artery disease, status post non-ST myocardial infarction in 2014.  On December 18, 2013, she underwent PCI placement of a 3 x 24 mm Promus Element drug-eluting stent to the mid left anterior descending coronary artery.  The left circumflex and right coronary had mild nonobstructive disease.  EF by echocardiogram on December 17, 2012, had an EF of 55-60% with mild aortic insufficiency, grade I diastolic dysfunction.    She had a nuclear stress test performed on March 04, 2014, at [facility] that revealed an ejection fraction of 56% and no ischemia or fixed defect on nuclear imaging.    CURRENT MEDICATIONS:  Please see MAR.  We have increased her Lopressor to 150 milligrams twice during the current visit.  She is also going to be on Aldactone 25 milligrams once a day in order to help her potassium stay in normal limits.    Current Outpatient Prescriptions   Medication Sig   . CHILDREN'S  ASPIRIN 81 mg Oral Tablet, Chewable TAKE 1 TABLET DAILY   . clopidogrel (PLAVIX) 75 mg Oral Tablet TAKE 1 TABLET DAILY   . CRESTOR 20 mg Oral Tablet TAKE 1 TABLET EVERY EVENING   . cyclobenzaprine (FLEXERIL) 10 mg Oral Tablet Take 10 mg by mouth Three times a day   . diclofenac sodium (VOLTAREN) 75 mg Oral Tablet, Delayed Release (E.C.) Take 75 mg by mouth Once a day   . levomilnacipran (FETZIMA) 40 mg Oral Capsule,Sustained Action 24 hr Take 40 mg by mouth Once a day   . losartan (COZAAR) 50 mg Oral Tablet Take 50 mg by mouth Once a day   . metoprolol (LOPRESSOR) 100 mg Oral Tablet Take 1.5 Tabs (150 mg total) by mouth Twice daily for 90 days   . nitroglycerin (NITROSTAT) 0.3 mg Sublingual Tablet, Sublingual 1 Tab (0.3 mg total) by Sublingual route Every 5 minutes as needed for Chest pain for 3 doses over 15 minutes   . pantoprazole (PROTONIX) 40 mg Oral Tablet, Delayed Release (E.C.) Take 1 Tab (40 mg total) by mouth Once a day   . spironolactone (ALDACTONE) 25 mg Oral Tablet Take 1 Tab (25 mg total) by mouth Once a day for 30 days       ALLERGIES:  CAPOTEN, DEMEROL, DILAUDID.    FAMILY HISTORY, SOCIAL HISTORY, PAST MEDICAL HISTORY, PAST SURGICAL HISTORY:  Unchanged as per note of July 30, 2013.    REVIEW OF SYSTEMS:  Ten systems review is negative except as already mentioned above in history of present illness.  The patient will occasionally have lower back pain.  She also has noticed that when she is hypokalemic she will have some cramping in her hands and her legs which resolves when she takes a potassium supplement.  The patient has been noncompliant with medical therapy and went without medicine for a week because she ran out of them.  She says she got a refill through her PCP and has been taking her medicines since then.    PHYSICAL EXAMINATION:  Vital signs:  Blood pressure is 142/98 with a pulse of 112 beats per minute, respirations 16, weight 199 pounds, height 5 feet 5 inches.  The patient is alert  and oriented x3.  She is a pleasant African-American who is not in any acute distress.    HEENT:  Head is atraumatic, normocephalic.  Eyes:  Conjunctiva clear.  Extraocular motions are intact.    NECK:  Revealed no distention,  no carotid bruits.  Carotid upstrokes are normal.    LUNGS:  Clear to auscultation, no crackles or wheezing.  Good air entry.    CARDIOVASCULAR:  Sinus tachycardia, S1 and S2, with no added sounds, rubs, murmurs, gallops.  Apex was not displaced.  Chest wall is nontender.    ABDOMEN:  Soft, nontender, no hepatosplenomegaly.  Bowel sounds are present.    EXTREMITIES:  Lower extremities:  Trace edema.  Dorsalis pedis and posterior tibial pulses are palpable 1/2 and symmetrical.  Radial pulses 2/2, bilaterally symmetrical.    PSYCHIATRIC:  Appropriate affect.    ASSESSMENT:  1.  Nuclear stress test done at [facility] on March 04, 2014, did not reveal any ischemia and showed normal ejection fraction.  There was also no significant scars noted.  2.  Sinus tachycardia on EKG.  This has also been documented in the past on Holter monitoring.  3.  Noncompliance with medical therapy.  The patient went without medicine for more than 1 week prior to getting her refills.  4.  Known coronary artery disease, status post non-ST myocardial infarction in June 2014 with placement of a 3 x 24 Promus Element stent to the left anterior descending coronary artery (June, 2014).  5.  Normal ejection fraction by echocardiography and nuclear scan.  6.  History of hypokalemia and hypertension.  7.  Obesity.  8.  Dyslipidemia for which she is taking statins.  9.  Orthostatic hypotension with excessive use of caffeine in the form of tea.    PLAN:  1.  I asked her to stay hydrated and reduce her caffeine intake.  2.  She should wear compression stockings on a daily basis to help with orthostatic symptoms.  3.  I am increasing the Lopressor at 150 milligrams twice a day to help control her heart rate.  4.   I have asked her to get a CBC, Chem-7 and thyroid function profile rechecked through her PCP.  She is also to get a liver function profile and have lipids followed through them.  5.  I have asked her to continue follow up with Dr. Vanessa BarbaraZamora to evaluate any neurological cause of her symptoms.    6.  We are starting her Aldactone because she has been documented as having hypokalemia and does not want to take potassium supplements.  This may help reduce the need for potassium.  This is probably going to help her hypertension as well.  I suspect her cause for hypokalemia is excessive caffeine use, which is likely causing diuresis in her.  She will discuss this further with her PCP.    The patient was asked to follow up in this office in 6 months or earlier date if required.      Rondell ReamsFarrukh Loie Jahr, MD      ZO/[id number]; D: 04/16/2014 09:19:36 T: 04/16/2014 23:06:55    cc: Jule EconomyEduardo Zamora MD      Earl ManyINBASKET              Justin Glassford MD      Shirleen SchirmerINBASKET

## 2014-04-16 NOTE — Procedures (Signed)
See dictated note.

## 2014-04-16 NOTE — Progress Notes (Signed)
See dictated note.    [id number]

## 2014-10-19 ENCOUNTER — Other Ambulatory Visit (INDEPENDENT_AMBULATORY_CARE_PROVIDER_SITE_OTHER): Payer: Self-pay | Admitting: CARDIOVASCULAR DISEASE

## 2014-10-20 MED ORDER — METOPROLOL TARTRATE 100 MG TABLET
150.0000 mg | ORAL_TABLET | Freq: Two times a day (BID) | ORAL | Status: DC
Start: 2014-10-20 — End: 2015-02-18

## 2014-10-20 NOTE — Telephone Encounter (Signed)
Sent back to Dr Annabell SabalJalisi - pt takes 150mg  BID- pt has appt scheduled in July

## 2014-10-20 NOTE — Telephone Encounter (Signed)
Called pt made aware of rx approval [name], [facility]  10/20/2014, 10:12

## 2014-10-20 NOTE — Telephone Encounter (Signed)
Called pt made ware of [name]  10/20/2014, 14:36

## 2015-01-20 ENCOUNTER — Ambulatory Visit (INDEPENDENT_AMBULATORY_CARE_PROVIDER_SITE_OTHER): Payer: Commercial Managed Care - PPO | Admitting: CARDIOVASCULAR DISEASE

## 2015-01-20 ENCOUNTER — Encounter (INDEPENDENT_AMBULATORY_CARE_PROVIDER_SITE_OTHER): Payer: Self-pay | Admitting: CARDIOVASCULAR DISEASE

## 2015-01-20 VITALS — BP 138/84 | HR 84 | Resp 18 | Ht 65.0 in | Wt 204.4 lb

## 2015-01-20 DIAGNOSIS — E785 Hyperlipidemia, unspecified: Secondary | ICD-10-CM

## 2015-01-20 DIAGNOSIS — I2510 Atherosclerotic heart disease of native coronary artery without angina pectoris: Secondary | ICD-10-CM

## 2015-01-20 DIAGNOSIS — E782 Mixed hyperlipidemia: Secondary | ICD-10-CM

## 2015-01-20 DIAGNOSIS — Z955 Presence of coronary angioplasty implant and graft: Secondary | ICD-10-CM

## 2015-01-20 DIAGNOSIS — Z6834 Body mass index (BMI) 34.0-34.9, adult: Secondary | ICD-10-CM

## 2015-01-20 DIAGNOSIS — I10 Essential (primary) hypertension: Secondary | ICD-10-CM

## 2015-01-20 NOTE — Progress Notes (Signed)
[place] [facility]      PROGRESS NOTE    PATIENT NAME:[name], [facility] NUMBER:[id number]  DATE OF SERVICE: 01/20/2015    DATE OF BIRTH:1951/01/29    PRIMARY CARE PHYSICIAN: Sid Falcon, MD    REASON FOR CLINIC VISIT:   H/o CAD  HTN     SUBJECTIVE: This is a 64year-old black female who comes to cardiology for a 9 month followup visit. She has h/o CAD, HTN and she has had chronic dizziness for which she sees Dr. Vanessa Barbara. H/o tachycardia for years and A 48-hour Holter monitor showed frequent sinus tachycardia with occasional PVCs and PACs. The sinus tachycardia was present 24% of times during the day. Her symptoms have been inconsistent with any tachycardia or rhythm issues.    We increased her lopressor to 150mg  BID at the last visit and her HR and BP control have significantly improved. She also had aldactone added to her regimen with h/o HTN and low K. She states BP has improved and BP checked by PCP was "good" in May 2016. She did not bring these results for my review.    The patient denies any chest pain, shortness of breath, orthopnea, paroxysmal dyspnea. Today she does not complain of any dizziness, syncope, presyncope or palpitations    On March 04, 2014, stress test in [place] performed by Dr. Carma Lair, MD. This revealed that she was able to walk for 4 minutes and 20 seconds into the stage 2 of standard Bruce protocol, achieving 86% of age predicted heart rate. The nuclear portion of the stress test revealed a normal left ventricular systolic function of 56%. They did not see any reversible ischemia or fixed defect consistent with previous scar, and this was thought to be at low risk stress test.    H/o Orthostatic hypotension with excessive use of caffeine in the form of tea..I had advised her to reduce caffeine in her diet which she tried but not successfully. We asked her  to wear compression socks for orthostasis symptoms but she chose not to do this because it was too difficult to    H/o Noncompliance with medical therapy. She states she has been compliant with her meds since the last visit.    PAST CARDIAC HISTORY: The patient is known to have coronary artery disease, status post non-ST myocardial infarction in 2014. On December 18, 2013, she underwent PCI placement of a 3 x 24 mm Promus Element drug-eluting stent to the mid left anterior descending coronary artery. The left circumflex and right coronary had mild nonobstructive disease. EF by echocardiogram on December 17, 2012, had an EF of 55-60% with mild aortic insufficiency, grade I diastolic dysfunction.      CURRENT MEDICATIONS:   Current Outpatient Prescriptions   Medication Sig   . CHILDREN'S ASPIRIN 81 mg Oral Tablet, Chewable TAKE 1 TABLET DAILY   . clopidogrel (PLAVIX) 75 mg Oral Tablet Take 1 Tab (75 mg total) by mouth Once a day .Follow Blood work with PCP.   Marland Kitchen CRESTOR 20 mg Oral Tablet TAKE 1 TABLET EVERY EVENING   . cyclobenzaprine (FLEXERIL) 10 mg Oral Tablet Take 10 mg by mouth Three times a day   . diclofenac sodium (VOLTAREN) 75 mg Oral Tablet, Delayed Release (E.C.) Take 75 mg by mouth Twice daily   . Diclofenac Sodium 3 % Gel 3 % by Apply externally route Four times a day   . levomilnacipran (FETZIMA) 40 mg Oral Capsule,Sustained Action 24 hr Take 40 mg by  mouth Once a day   . losartan (COZAAR) 50 mg Oral Tablet Take 50 mg by mouth Once a day   . metoprolol (LOPRESSOR) 100 mg Oral Tablet Take 1.5 Tabs (150 mg total) by mouth Twice daily for 90 days   . nitroglycerin (NITROSTAT) 0.3 mg Sublingual Tablet, Sublingual 1 Tab (0.3 mg total) by Sublingual route Every 5 minutes as needed for Chest pain for 3 doses over 15 minutes   . pantoprazole (PROTONIX) 40 mg Oral Tablet, Delayed Release (E.C.) Take 1 Tab (40 mg total) by mouth Once a day (Patient taking differently: Take 20 mg by mouth Once a day )   .  spironolactone (ALDACTONE) 25 mg Oral Tablet Take 1 Tab (25 mg total) by mouth Once a day for 30 days                                                                       ALLERGIES: CAPOTEN, DEMEROL, DILAUDID.    REVIEW OF SYSTEMS:As per HPI.  C/o arthritic pains in knees intermittently. She has gained weight due to lack of exercise. No fevers or chills  CVS: As per HPI  Rep: no wheezing or SOb  GI: no abdominal pain, nausea or vominting. NO bleeding/ melena    PHYSICAL EXAMINATION:  Filed Vitals:    01/20/15 0809   BP: 138/84   Pulse: 84   Resp: 18   Height: 1.651 m (5\' 5" )   Weight: 92.715 kg (204 lb 6.4 oz)   SpO2: 96%     Body mass index is 34.01 kg/(m^2).  he patient is alert and oriented x3. She is a pleasant African-American who is not in any acute distress.    HEENT: Head is atraumatic, normocephalic. Eyes: Conjunctiva clear. Extraocular motions are intact.    NECK: Revealed no distention, no carotid bruits. Carotid upstrokes are normal.    LUNGS: Clear to auscultation, no crackles or wheezing. Good air entry.    CARDIOVASCULAR: Sinus tachycardia, S1 and S2, with no added sounds, rubs, murmurs, gallops. Apex was not displaced. Chest wall is nontender.    ABDOMEN: Soft, nontender, no hepatosplenomegaly. Bowel sounds are present.    EXTREMITIES: Lower extremities: Trace edema. Dorsalis pedis and posterior tibial pulses are palpable 1/2 and symmetrical. Radial pulses 2/2, bilaterally symmetrical.    PSYCHIATRIC: Appropriate affect.    ASSESSMENT:  1. CAD is stable with no angina. status post non-ST myocardial infarction in June 2014 with placement of a 3 x 24 Promus Element stent to the left anterior descending coronary artery (June, 2014).   Nuclear stress test done at [facility] on March 04, 2014, did not reveal any ischemia and showed normal ejection fraction. There was also no significant scars noted.  2. h/o Sinus tachycardia on EKG.and also documented in the  past on Holter monitoring. This is controlled now on lopressor 150mg  BID  3. HTN ; well controlled  4. Dyslipidemia for which she is taking statins.      PLAN:  1. Continue medical therapy as prescribed. No changes were made. At next visit will d/c Plavix since she would have complete 30 months of DAP after PCi. She will continue aspirin long term.  I asked her to stay hydrated and reduce  her caffeine intake.  2. She should wear compression stockings on a daily basis to help with h/o orthostatic symptoms. Needs to get in walking program daily.  3. I have asked her to get a CBC, Chem-7 and thyroid function profile rechecked through her PCP. She is also to get a liver function profile and have lipids followed through them.    The patient was asked to follow up in this office in 6 months or earlier date if required.          Rondell Reams, MD, [facility], [facility]    Interventional Cardiology  [facility]  [facility]    Portions of this note may be dictated using voice recognition software or a dictation service. Variances in spelling and vocabulary are possible and unintentional. Not all errors are caught/corrected. Please notify the Thereasa Parkin if any discrepancies are noted or if the meaning of any statement is not clear.     Note to Billing: Please review elements of this note to attribute correct coding as per insurance company and/ or CMS rules.

## 2015-02-10 ENCOUNTER — Inpatient Hospital Stay (HOSPITAL_BASED_OUTPATIENT_CLINIC_OR_DEPARTMENT_OTHER)
Admission: EM | Admit: 2015-02-10 | Discharge: 2015-02-18 | DRG: 690 | Disposition: A | Discharge status: Home / Self Care | Payer: Medicare PPO | Attending: Nephrology | Admitting: Nephrology

## 2015-02-10 ENCOUNTER — Encounter (HOSPITAL_BASED_OUTPATIENT_CLINIC_OR_DEPARTMENT_OTHER): Payer: Self-pay

## 2015-02-10 ENCOUNTER — Emergency Department (HOSPITAL_BASED_OUTPATIENT_CLINIC_OR_DEPARTMENT_OTHER): Payer: Commercial Managed Care - PPO

## 2015-02-10 DIAGNOSIS — I10 Essential (primary) hypertension: Secondary | ICD-10-CM | POA: Diagnosis present

## 2015-02-10 DIAGNOSIS — N390 Urinary tract infection, site not specified: Secondary | ICD-10-CM | POA: Diagnosis present

## 2015-02-10 DIAGNOSIS — D72829 Elevated white blood cell count, unspecified: Secondary | ICD-10-CM | POA: Diagnosis present

## 2015-02-10 DIAGNOSIS — I2510 Atherosclerotic heart disease of native coronary artery without angina pectoris: Secondary | ICD-10-CM | POA: Diagnosis present

## 2015-02-10 DIAGNOSIS — R197 Diarrhea, unspecified: Secondary | ICD-10-CM | POA: Diagnosis present

## 2015-02-10 DIAGNOSIS — E538 Deficiency of other specified B group vitamins: Secondary | ICD-10-CM | POA: Diagnosis present

## 2015-02-10 DIAGNOSIS — E876 Hypokalemia: Secondary | ICD-10-CM | POA: Diagnosis present

## 2015-02-10 DIAGNOSIS — E669 Obesity, unspecified: Secondary | ICD-10-CM | POA: Diagnosis present

## 2015-02-10 DIAGNOSIS — T39395A Adverse effect of other nonsteroidal anti-inflammatory drugs [NSAID], initial encounter: Secondary | ICD-10-CM | POA: Diagnosis present

## 2015-02-10 DIAGNOSIS — R9431 Abnormal electrocardiogram [ECG] [EKG]: Secondary | ICD-10-CM | POA: Insufficient documentation

## 2015-02-10 DIAGNOSIS — E782 Mixed hyperlipidemia: Secondary | ICD-10-CM | POA: Diagnosis present

## 2015-02-10 DIAGNOSIS — Z6834 Body mass index (BMI) 34.0-34.9, adult: Secondary | ICD-10-CM

## 2015-02-10 DIAGNOSIS — N10 Acute tubulo-interstitial nephritis: Principal | ICD-10-CM | POA: Diagnosis present

## 2015-02-10 DIAGNOSIS — E873 Alkalosis: Secondary | ICD-10-CM | POA: Diagnosis present

## 2015-02-10 DIAGNOSIS — N179 Acute kidney failure, unspecified: Secondary | ICD-10-CM

## 2015-02-10 LAB — CBC WITH DIFF
BASOPHIL #: 0 x10ˆ3/uL (ref 0.00–0.10)
BASOPHIL %: 0 % (ref 0–3)
EOSINOPHIL #: 0.2 x10ˆ3/uL (ref 0.00–0.50)
EOSINOPHIL %: 1 % (ref 0–5)
HCT: 33.4 % — ABNORMAL LOW (ref 36.0–45.0)
HGB: 10.9 g/dL — ABNORMAL LOW (ref 12.0–15.5)
LYMPHOCYTE #: 1.6 x10ˆ3/uL (ref 1.00–4.80)
LYMPHOCYTE %: 11 % — ABNORMAL LOW (ref 15–43)
MCH: 24.6 pg — ABNORMAL LOW (ref 27.5–33.2)
MCHC: 32.7 g/dL (ref 32.0–36.0)
MCV: 75.2 fL — ABNORMAL LOW (ref 82.0–97.0)
MONOCYTE #: 1.7 x10ˆ3/uL — ABNORMAL HIGH (ref 0.20–0.90)
MONOCYTE %: 12 % (ref 5–12)
MPV: 7.7 fL (ref 7.4–10.5)
NEUTROPHIL #: 11.2 x10ˆ3/uL — ABNORMAL HIGH (ref 1.50–6.50)
NEUTROPHIL %: 76 % (ref 43–76)
PLATELETS: 357 x10ˆ3/uL (ref 150–450)
RBC: 4.44 x10ˆ6/uL (ref 4.00–5.10)
RDW: 16.5 % — ABNORMAL HIGH (ref 11.0–16.0)
WBC: 14.8 x10ˆ3/uL — ABNORMAL HIGH (ref 4.0–11.0)

## 2015-02-10 LAB — COMPREHENSIVE METABOLIC PANEL, NON-FASTING
ALBUMIN/GLOBULIN RATIO: 1.1 (ref 0.8–2.0)
ALBUMIN: 3.2 g/dL — ABNORMAL LOW (ref 3.5–5.0)
ALKALINE PHOSPHATASE: 125 U/L (ref 38–126)
ALT (SGPT): 21 U/L (ref 14–54)
ANION GAP: 16 mmol/L — ABNORMAL HIGH (ref 3–11)
AST (SGOT): 24 U/L (ref 15–41)
BILIRUBIN TOTAL: 1 mg/dL (ref 0.3–1.2)
BUN/CREA RATIO: 6 (ref 6–22)
BUN: 45 mg/dL — ABNORMAL HIGH (ref 6–20)
CALCIUM: 7.6 mg/dL — ABNORMAL LOW (ref 8.8–10.2)
CHLORIDE: 94 mmol/L — ABNORMAL LOW (ref 101–111)
CO2 TOTAL: 24 mmol/L (ref 22–32)
CREATININE: 7.34 mg/dL — ABNORMAL HIGH (ref 0.44–1.00)
ESTIMATED GFR: 7 mL/min/1.73mˆ2 — ABNORMAL LOW (ref >60)
GLUCOSE: 94 mg/dL (ref 70–110)
POTASSIUM: 3.2 mmol/L — ABNORMAL LOW (ref 3.4–5.1)
PROTEIN TOTAL: 6.1 g/dL — ABNORMAL LOW (ref 6.4–8.3)
SODIUM: 134 mmol/L — ABNORMAL LOW (ref 136–145)

## 2015-02-10 LAB — URINALYSIS, MACROSCOPIC
BILIRUBIN: NEGATIVE mg/dL
GLUCOSE: NEGATIVE mg/dL
KETONES: NEGATIVE mg/dL
NITRITE: NEGATIVE
PH: 5 (ref <8.0)
PROTEIN: 100 mg/dL — AB
SPECIFIC GRAVITY: 1.009 (ref <1.022)
UROBILINOGEN: <2 mg/dL (ref <=2.0)

## 2015-02-10 LAB — FOLATE: FOLATE: 14.4 ng/mL (ref >=4.5)

## 2015-02-10 LAB — URINALYSIS, MICROSCOPIC: WBCS: >100 /hpf — AB (ref 0–2)

## 2015-02-10 LAB — PHOSPHORUS: PHOSPHORUS: 6.8 mg/dL — ABNORMAL HIGH (ref 2.7–4.5)

## 2015-02-10 LAB — BLUE TOP TUBE

## 2015-02-10 LAB — CREATINE KINASE (CK), TOTAL, SERUM OR PLASMA: CREATINE KINASE: 88 U/L (ref 38–234)

## 2015-02-10 LAB — MAGNESIUM: MAGNESIUM: 1.5 mg/dL (ref 1.4–2.1)

## 2015-02-10 LAB — LIPASE: LIPASE: 23 U/L (ref 22–51)

## 2015-02-10 LAB — TROPONIN-I: TROPONIN I: <0.03 ng/mL (ref <=0.06)

## 2015-02-10 LAB — THYROID STIMULATING HORMONE WITH FREE T4 REFLEX: TSH: 0.767 u[IU]/mL (ref 0.340–5.600)

## 2015-02-10 LAB — VITAMIN B12: VITAMIN B 12: 188 pg/mL (ref 180–914)

## 2015-02-10 LAB — RED TOP TUBE

## 2015-02-10 MED ORDER — HEPARIN (PORCINE) 5,000 UNIT/ML INJECTION SOLUTION
5000.0000 [IU] | Freq: Three times a day (TID) | INTRAMUSCULAR | Status: DC
Start: 2015-02-10 — End: 2015-02-18
  Administered 2015-02-10 – 2015-02-11 (×4): 5000 [IU] via SUBCUTANEOUS
  Administered 2015-02-12: 0 [IU] via SUBCUTANEOUS
  Administered 2015-02-12 – 2015-02-17 (×17): 5000 [IU] via SUBCUTANEOUS
  Administered 2015-02-18: 0 [IU] via SUBCUTANEOUS
  Administered 2015-02-18: 5000 [IU] via SUBCUTANEOUS
  Filled 2015-02-10 (×5): qty 1
  Filled 2015-02-10: qty 2
  Filled 2015-02-10 (×19): qty 1

## 2015-02-10 MED ORDER — NITROGLYCERIN 0.4 MG SUBLINGUAL TABLET
0.40 mg | SUBLINGUAL_TABLET | SUBLINGUAL | Status: DC | PRN
Start: 2015-02-10 — End: 2015-02-18

## 2015-02-10 MED ORDER — PANTOPRAZOLE 40 MG TABLET,DELAYED RELEASE
20.00 mg | DELAYED_RELEASE_TABLET | Freq: Every day | ORAL | Status: DC
Start: 2015-02-11 — End: 2015-02-12
  Administered 2015-02-11 – 2015-02-12 (×2): 40 mg via ORAL
  Filled 2015-02-10 (×2): qty 1

## 2015-02-10 MED ORDER — LORAZEPAM 2 MG/ML INJECTION SOLUTION
0.5000 mg | INTRAMUSCULAR | Status: DC | PRN
Start: 2015-02-10 — End: 2015-02-18

## 2015-02-10 MED ORDER — SODIUM CHLORIDE 0.9 % IV BOLUS
1000.0000 mL | INJECTION | Status: AC
Start: 2015-02-10 — End: 2015-02-10
  Administered 2015-02-10: 1000 mL via INTRAVENOUS

## 2015-02-10 MED ORDER — PANTOPRAZOLE 40 MG TABLET,DELAYED RELEASE
20.00 mg | DELAYED_RELEASE_TABLET | Freq: Every day | ORAL | Status: DC
Start: 2015-02-11 — End: 2015-02-10

## 2015-02-10 MED ORDER — ASPIRIN 81 MG CHEWABLE TABLET
81.0000 mg | CHEWABLE_TABLET | Freq: Every day | ORAL | Status: DC
Start: 2015-02-11 — End: 2015-02-18
  Administered 2015-02-11: 81 mg via ORAL
  Administered 2015-02-12: 0 mg via ORAL
  Administered 2015-02-13 – 2015-02-18 (×6): 81 mg via ORAL
  Filled 2015-02-10 (×8): qty 1

## 2015-02-10 MED ORDER — ROSUVASTATIN 20 MG TABLET
20.00 mg | ORAL_TABLET | Freq: Every evening | ORAL | Status: DC
Start: 2015-02-10 — End: 2015-02-12
  Administered 2015-02-10 – 2015-02-11 (×2): 20 mg via ORAL
  Filled 2015-02-10 (×2): qty 1

## 2015-02-10 MED ORDER — CEFTRIAXONE 1 GRAM/50 ML IN DEXTROSE (ISO-OSMOT) INTRAVENOUS PIGGYBACK
1.0000 g | INJECTION | INTRAVENOUS | Status: DC
Start: 2015-02-10 — End: 2015-02-12
  Administered 2015-02-10: 0 g via INTRAVENOUS
  Administered 2015-02-11: 1 g via INTRAVENOUS
  Filled 2015-02-10 (×3): qty 50

## 2015-02-10 MED ORDER — CLOPIDOGREL 75 MG TABLET
75.0000 mg | ORAL_TABLET | Freq: Every day | ORAL | Status: DC
Start: 2015-02-11 — End: 2015-02-18
  Administered 2015-02-11: 75 mg via ORAL
  Administered 2015-02-12: 0 mg via ORAL
  Administered 2015-02-13 – 2015-02-18 (×6): 75 mg via ORAL
  Filled 2015-02-10 (×9): qty 1

## 2015-02-10 MED ORDER — SODIUM CHLORIDE 0.9 % IV BOLUS
1000.0000 mL | INJECTION | Freq: Once | Status: DC
Start: 2015-02-10 — End: 2015-02-17
  Administered 2015-02-10: 0 mL via INTRAVENOUS

## 2015-02-10 MED ORDER — SODIUM CHLORIDE 0.9 % (FLUSH) INJECTION SYRINGE
10.0000 mL | INJECTION | INTRAMUSCULAR | Status: DC | PRN
Start: 2015-02-10 — End: 2015-02-18

## 2015-02-10 MED ORDER — SODIUM CHLORIDE 0.9 % INTRAVENOUS SOLUTION
INTRAVENOUS | Status: DC
Start: 2015-02-10 — End: 2015-02-12

## 2015-02-10 MED ORDER — ACETAMINOPHEN 325 MG TABLET
650.0000 mg | ORAL_TABLET | Freq: Four times a day (QID) | ORAL | Status: DC | PRN
Start: 2015-02-10 — End: 2015-02-18
  Administered 2015-02-12: 650 mg via ORAL
  Filled 2015-02-10: qty 2

## 2015-02-10 MED ORDER — SODIUM CHLORIDE 0.9 % (FLUSH) INJECTION SYRINGE
10.0000 mL | INJECTION | Freq: Three times a day (TID) | INTRAMUSCULAR | Status: DC
Start: 2015-02-10 — End: 2015-02-18
  Administered 2015-02-10: 0 mL via INTRAVENOUS
  Administered 2015-02-11: 10 mL via INTRAVENOUS
  Administered 2015-02-11 – 2015-02-12 (×3): 0 mL via INTRAVENOUS
  Administered 2015-02-12: 10 mL via INTRAVENOUS
  Administered 2015-02-12 – 2015-02-13 (×2): 0 mL via INTRAVENOUS
  Administered 2015-02-13 – 2015-02-18 (×16): 10 mL via INTRAVENOUS

## 2015-02-10 MED ADMIN — lactated Ringers intravenous solution: INTRAVENOUS | @ 18:00:00 | NDC 00264775000

## 2015-02-10 MED ADMIN — sodium chloride 0.9 % intravenous solution: INTRAVENOUS | @ 23:00:00

## 2015-02-10 NOTE — ED Nurses Note (Signed)
Report to rn on 5th floor

## 2015-02-10 NOTE — ED Nurses Note (Signed)
Pt in ct at present

## 2015-02-10 NOTE — ED Nurses Note (Signed)
2 warm blankets placed over the patient per her request

## 2015-02-10 NOTE — ED Nurses Note (Signed)
Pt to hall via cart with rn and monitor

## 2015-02-10 NOTE — ED Nurses Note (Signed)
Patient placed in a hospital gown.   I introduced myself with name and title.  Patient was placed on a cardiac, blood pressure and O2 sat monitor, call bell placed within easy reach, patient educated on use of call bell/TV.  Side rails placed in the upright position, bed in low and locked position.  Patient was informed not to eat or drink anything until the Doctor has completed his/her exam.  Informed patient if they have to use the bathroom to let the Nursing staff know so we can collect a sample.

## 2015-02-10 NOTE — ED Nurses Note (Signed)
Attempted to call report  Floor rn will call back

## 2015-02-10 NOTE — ED Nurses Note (Signed)
Pt up to br to void for urine specimen  2nd set of bc drawn

## 2015-02-10 NOTE — ED Nurses Note (Signed)
Pt sent from Dr. Tharon Aquas yesterday LLQ pain & numbness/prickling to belly, sent today to ED for abnormal lab values, kidney function messed up, WBC 19, last 2 times went to BR urine clear

## 2015-02-10 NOTE — ED Provider Notes (Signed)
[facility]  Emergency Department Visit Note    Date:  02/10/2015  Primary care provider:  Sid Falcon, MD  Means of arrival:  private car  History obtained from: patient  History limited by: none    Chief Complaint:  Abnormal Results     HISTORY OF PRESENT ILLNESS     [name], date of birth [birth year], is a 64 y.o. female who presents to the Emergency Department for abnormal results. The patient reports she developed chills and body aches a few days ago and yesterday she developed left lower abdominal pain. Patient went to her PCP's office for her symptoms and had lab work done. The patient's lab results were abnormal, showing a possible infection and low kidney function so the patient was sent to the ED. Patient denies any vomiting or chest pain. She indicates clear urine and diarrhea. The patient denies any recent antibiotics or any recent sick contacts. Patient is not having any pain at this time. Patient takes a diuretic.     REVIEW OF SYSTEMS     The pertinent positive and negative symptoms are as per HPI. All other systems reviewed and are negative.     PATIENT HISTORY     Past Medical History:  Past Medical History   Diagnosis Date    HTN (hypertension)     Coronary artery disease     Wears glasses     Hyperlipemia     Obesity     Mixed hyperlipidemia 07/30/2013       Past Surgical History:  Past Surgical History   Procedure Laterality Date    Hx heart catheterization      Hx hysterectomy      Hx tmj arthotomy  x2    Hx laminoplasty      Hx cervical diskectomy         Family History:  Family History   Problem Relation Age of Onset    Hypertension Sister     Diabetes Sister     Hypertension Maternal Grandmother     Coronary Artery Disease Maternal Grandmother      elderly    Coronary Artery Disease Maternal Uncle      elderly       Social History:  History   Substance Use Topics    Smoking status: Never Smoker     Smokeless tobacco: Never Used     Alcohol Use: No     History   Drug Use No       Medications:  Outpatient Prescriptions Marked as Taking for the 02/10/15 encounter [facility] Encounter)   Medication Sig    CHILDREN'S ASPIRIN 81 mg Oral Tablet, Chewable TAKE 1 TABLET DAILY    clopidogrel (PLAVIX) 75 mg Oral Tablet Take 1 Tab (75 mg total) by mouth Once a day .Follow Blood work with PCP.    CRESTOR 20 mg Oral Tablet TAKE 1 TABLET EVERY EVENING    cyclobenzaprine (FLEXERIL) 10 mg Oral Tablet Take 10 mg by mouth Three times a day    Diclofenac Sodium 3 % Gel 3 % by Apply externally route Four times a day    levomilnacipran (FETZIMA) 40 mg Oral Capsule,Sustained Action 24 hr Take 40 mg by mouth Once a day    losartan (COZAAR) 50 mg Oral Tablet Take 50 mg by mouth Once a day    nitroglycerin (NITROSTAT) 0.3 mg Sublingual Tablet, Sublingual 1 Tab (0.3 mg total) by Sublingual route Every 5 minutes  as needed for Chest pain for 3 doses over 15 minutes       Allergies:  Allergies   Allergen Reactions    Capoten [Captopril]     Demerol [Meperidine]     Dilaudid [Hydromorphone]     Tramadol Diarrhea       PHYSICAL EXAM     Vitals:  Filed Vitals:    02/10/15 1710   BP: 138/68   Pulse: 103   Temp: 36.5 C (97.7 F)   Resp: 22   SpO2: 98%       Pulse ox  98% on None (Room Air) interpreted by me as: Normal    General: Awake. Alert. No acute distress.  Head:  Atraumatic     Eyes: Anicteric sclera, noninjected conjunctiva.  PERRL. EOMI.  ENT: Oropharynx patent, pink, moist, no erythema, exudates or asymmetry. No stridor.   Neck: Neck is supple, nontender, no adenopathy. No nuchal rigidity.   Lungs: Clear to auscultation bilaterally. No wheezes, rales or rhonchi.  no respiratory distress.  Cardiovascular:  Heart is regular without murmurs rubs or gallops   Abdomen:  Soft, Mild tenderness in the LLQ, non-distended, normoactive bowel sounds. No peritoneal signs. No pulsatile mass.  Extremities:  No cyanosis, edema, tenderness or asymmetry.  Skin: Skin warm,  dry and well-perfused. No petechia or erythema.   Back:  Non-tender to palpation in the midline.    Neurologic: Awake, alert, no lethargy, somnolence or confusion. Moves upper and lower extremities without focal deficits.   Psychiatric: Answers questions appropriately.     DIAGNOSTIC STUDIES     Labs:    Results for orders placed or performed during the hospital encounter of 02/10/15   CREATINE KINASE (CK), TOTAL, SERUM   Result Value Ref Range    CREATINE KINASE 88 38-234 U/L   COMPREHENSIVE METABOLIC PANEL, NON-FASTING   Result Value Ref Range    SODIUM 134 (L) 136-145 mmol/L    POTASSIUM 3.2 (L) 3.4-5.1 mmol/L    CHLORIDE 94 (L) 101-111 mmol/L    CO2 TOTAL 24 22-32 mmol/L    ANION GAP 16 (H) 3-11 mmol/L    BUN 45 (H) 6-20 mg/dL    CREATININE 1.61 (H) 0.44-1.00 mg/dL    BUN/CREA RATIO 6 0-96    ESTIMATED GFR 7 (L) >60 mL/min/1.26m2    ALBUMIN 3.2 (L) 3.5-5.0 g/dL    CALCIUM 7.6 (L) 0.4-54.0 mg/dL    GLUCOSE 94 98-119 mg/dL    ALKALINE PHOSPHATASE 125 38-126 U/L    ALT (SGPT) 21 14-54 U/L    AST (SGOT) 24 15-41 U/L    BILIRUBIN TOTAL 1.0 0.3-1.2 mg/dL    PROTEIN TOTAL 6.1 (L) 6.4-8.3 g/dL    ALBUMIN/GLOBULIN RATIO 1.1 0.8-2.0   MAGNESIUM   Result Value Ref Range    MAGNESIUM 1.5 1.4-2.1 mg/dL   PHOSPHORUS   Result Value Ref Range    PHOSPHORUS 6.8 (H) 2.7-4.5 mg/dL   LIPASE   Result Value Ref Range    LIPASE 23 22-51 U/L   TROPONIN-I   Result Value Ref Range    TROPONIN I <0.03 <=0.06 ng/mL   CBC WITH DIFF   Result Value Ref Range    WBC 14.8 (H) 4.0-11.0 x103/uL    RBC 4.44 4.00-5.10 x106/uL    HGB 10.9 (L) 12.0-15.5 g/dL    HCT 14.7 (L) 82.9-56.2 %    MCV 75.2 (L) 82.0-97.0 fL    MCH 24.6 (L) 27.5-33.2 pg    MCHC 32.7 32.0-36.0 g/dL  RDW 16.5 (H) 11.0-16.0 %    PLATELETS 357 150-450 x103/uL    MPV 7.7 7.4-10.5 fL    NEUTROPHIL % 76 43-76 %    LYMPHOCYTE % 11 (L) 15-43 %    MONOCYTE % 12 5-12 %    EOSINOPHIL % 1 0-5 %    BASOPHIL % 0 0-3 %    NEUTROPHIL # 11.20 (H) 1.50-6.50 x103/uL    LYMPHOCYTE # 1.60  1.00-4.80 x103/uL    MONOCYTE # 1.70 (H) 0.20-0.90 x103/uL    EOSINOPHIL # 0.20 0.00-0.50 x103/uL    BASOPHIL # 0.00 0.00-0.10 x103/uL     Labs reviewed and interpreted by me.  Blood culture pending at the time of admission.    Radiology:   CT ABDOMEN PELVIS WO IV CONTRAST: no diverticulitis. Sludge in gallbladder. Possible mild bladder thickening.   Radiological imaging interpreted by radiologist and independently reviewed by me.    EKG:  12 lead EKG interpreted by me shows normal sinus rhythm, rate of 97 bpm, no acute ischemic changes, QT prolongation, compared to 12/19/2012 inverted T waves have resolved however QT prolongation is also present.     ED PROGRESS NOTE / MEDICAL DECISION MAKING     Old records reviewed by me:  I have reviewed the nurse's notes. I have reviewed the patient's problem list.   Lab results from 02/09/2015 showed a WBC of 19.7, BUN 34, creatinine 4.3, normal Potassium at 3.5, and her CO2 was 23.     Orders Placed This Encounter    ADULT ROUTINE BLOOD CULTURE, SET OF 2 BOTTLES (BACTERIA AND YEAST)    ADULT ROUTINE BLOOD CULTURE, SET OF 2 BOTTLES (BACTERIA AND YEAST)    CT ABDOMEN PELVIS WO IV CONTRAST    CBC/DIFF    CREATINE KINASE (CK), TOTAL, SERUM    COMPREHENSIVE METABOLIC PANEL, NON-FASTING    Magnesium    PHOSPHORUS    LIPASE    URINALYSIS, MACROSCOPIC    TROPONIN-I    ECG 12-LEAD    INSERT & MAINTAIN PERIPHERAL IV ACCESS    NS bolus infusion 1,000 mL       5:52 PM: Initial evaluation is complete at this time. I discussed with the patient that I would order an Abdominal CT, EKG, and various labs to further evaluate. I will reevaluate shortly to check the patient's progress after treatment with IV fluids. Patient is agreeable with the treatment plan at this time.    7:24 PM: On recheck, I explained the results of the diagnostic studies. I discussed the plan for admission. The patient understood and is in accordance with the treatment plan at this time. All of their  questions have been answered to their satisfaction.    7:32 PM: I discussed the patient's case and above findings with Dr. Bing [facility]) who is making arrangements for further monitoring and definitive care in the hospital.      Pre-Disposition Vitals:  Filed Vitals:    02/10/15 1710   BP: 138/68   Pulse: 103   Temp: 36.5 C (97.7 F)   Resp: 22   SpO2: 98%       CLINICAL IMPRESSION     Encounter Diagnosis   Name Primary?    AKI (acute kidney injury) Yes     DISPOSITION/PLAN     Admitted        Condition at Disposition: Stable        SCRIBE ATTESTATION STATEMENT  I Denton Brick, SCRIBE scribed for Kennon Rounds, DO on 02/10/2015  at 5:50 PM.     Documentation assistance provided for Kennon Rounds, DO  by Denton Brick, SCRIBE. Information recorded by the scribe was done at my direction and has been reviewed and validated by me Kennon Rounds, DO.

## 2015-02-10 NOTE — Nurses Notes (Signed)
Pt arrived to room i 504  from ED. She walked into room independently. Alert and oriented. Will monitor.

## 2015-02-11 ENCOUNTER — Inpatient Hospital Stay (HOSPITAL_BASED_OUTPATIENT_CLINIC_OR_DEPARTMENT_OTHER): Payer: Commercial Managed Care - PPO

## 2015-02-11 LAB — COMPREHENSIVE METABOLIC PANEL, NON-FASTING
ALBUMIN/GLOBULIN RATIO: 1.1 (ref 0.8–2.0)
ALBUMIN: 2.7 g/dL — ABNORMAL LOW (ref 3.5–5.0)
ALKALINE PHOSPHATASE: 103 U/L (ref 38–126)
ALT (SGPT): 17 U/L (ref 14–54)
ANION GAP: 13 mmol/L — ABNORMAL HIGH (ref 3–11)
AST (SGOT): 21 U/L (ref 15–41)
BILIRUBIN TOTAL: 0.6 mg/dL (ref 0.3–1.2)
BUN/CREA RATIO: 6 (ref 6–22)
BUN: 43 mg/dL — ABNORMAL HIGH (ref 6–20)
CALCIUM: 7 mg/dL — ABNORMAL LOW (ref 8.8–10.2)
CALCIUM: 7 mg/dL — ABNORMAL LOW (ref 8.8–10.2)
CHLORIDE: 101 mmol/L (ref 101–111)
CO2 TOTAL: 23 mmol/L (ref 22–32)
CREATININE: 7.26 mg/dL — ABNORMAL HIGH (ref 0.44–1.00)
ESTIMATED GFR: 7 mL/min/1.73mˆ2 — ABNORMAL LOW (ref >60)
GLUCOSE: 91 mg/dL (ref 70–110)
POTASSIUM: 3.4 mmol/L (ref 3.4–5.1)
PROTEIN TOTAL: 5.1 g/dL — ABNORMAL LOW (ref 6.4–8.3)
SODIUM: 137 mmol/L (ref 136–145)

## 2015-02-11 LAB — CBC WITH DIFF
BASOPHIL #: 0 x10ˆ3/uL (ref 0.00–0.10)
BASOPHIL %: 0 % (ref 0–3)
EOSINOPHIL #: 0.2 x10ˆ3/uL (ref 0.00–0.50)
EOSINOPHIL %: 1 % (ref 0–5)
HCT: 28.1 % — ABNORMAL LOW (ref 36.0–45.0)
HGB: 9.4 g/dL — ABNORMAL LOW (ref 12.0–15.5)
HGB: 9.4 g/dL — ABNORMAL LOW (ref 12.0–15.5)
LYMPHOCYTE #: 1.4 x10ˆ3/uL (ref 1.00–4.80)
LYMPHOCYTE %: 12 % — ABNORMAL LOW (ref 15–43)
MCH: 25.2 pg — ABNORMAL LOW (ref 27.5–33.2)
MCHC: 33.5 g/dL (ref 32.0–36.0)
MCV: 75.3 fL — ABNORMAL LOW (ref 82.0–97.0)
MONOCYTE #: 1.3 x10ˆ3/uL — ABNORMAL HIGH (ref 0.20–0.90)
MONOCYTE %: 11 % (ref 5–12)
MPV: 7.9 fL (ref 7.4–10.5)
NEUTROPHIL #: 9 x10ˆ3/uL — ABNORMAL HIGH (ref 1.50–6.50)
NEUTROPHIL %: 76 % (ref 43–76)
PLATELETS: 302 x10ˆ3/uL (ref 150–450)
RBC: 3.73 x10ˆ6/uL — ABNORMAL LOW (ref 4.00–5.10)
RDW: 16.2 % — ABNORMAL HIGH (ref 11.0–16.0)
WBC: 11.9 x10ˆ3/uL — ABNORMAL HIGH (ref 4.0–11.0)

## 2015-02-11 LAB — C. DIFFICILE PCR
C. DIFFICILE TOXIN GENE, PCR: NEGATIVE
PRESUMPTIVE 027/NAP1/BI: NEGATIVE

## 2015-02-11 LAB — MAGNESIUM: MAGNESIUM: 1.3 mg/dL — ABNORMAL LOW (ref 1.4–2.1)

## 2015-02-11 LAB — PHOSPHORUS: PHOSPHORUS: 6.7 mg/dL — ABNORMAL HIGH (ref 2.7–4.5)

## 2015-02-11 MED ORDER — MAGNESIUM SULFATE 2 GRAM/50 ML (4 %) IN WATER INTRAVENOUS PIGGYBACK
2.0000 g | INJECTION | Freq: Once | INTRAVENOUS | Status: AC
Start: 2015-02-11 — End: 2015-02-11
  Administered 2015-02-11: 2 g via INTRAVENOUS
  Filled 2015-02-11: qty 50

## 2015-02-11 MED ORDER — MAGNESIUM SULFATE 2 GRAM/50 ML (4 %) IN WATER INTRAVENOUS PIGGYBACK
2.0000 g | INJECTION | Freq: Once | INTRAVENOUS | Status: AC
Start: 2015-02-12 — End: 2015-02-12
  Administered 2015-02-12: 2 g via INTRAVENOUS
  Filled 2015-02-11: qty 50

## 2015-02-11 NOTE — Nurses Notes (Signed)
Pr appears to be resting in bed with eye closed. Breathing comfortably. IVF per Md order. Will monitor.

## 2015-02-11 NOTE — Care Plan (Signed)
Problem: Pain, Chronic (Adult)  Goal: Identify Related Risk Factors and Signs and Symptoms  Related risk factors and signs and symptoms are identified upon initiation of Human Response Clinical Practice Guideline (CPG)   Outcome: Ongoing (see interventions/notes)    02/11/15 2140   Pain, Chronic   Related Risk Factors (Chronic Pain) disease process

## 2015-02-11 NOTE — Progress Notes (Signed)
[facility]    IP PROGRESS NOTE      [place]  Date of Admission:  02/10/2015  Date of Birth:  May 30, 1951  Date of Service:  02/11/2015    Chief Complaint: Patient with ARF, new onset. Her PCP referred her to ED.       Subjective: ROS: Patient with no new complaints today. She feels tired. Denies any CP or SOB.     Vital Signs:  Temp (24hrs) Max:36.8 C (98.3 F)      Temperature: 36.8 C (98.3 F)  BP (Non-Invasive): (!) 152/90 mmHg  MAP (Non-Invasive): 105 mmHG  Heart Rate: (!) 116  Respiratory Rate: 18  Pain Score (Numeric, Faces): 0  SpO2-1: 96 %    Current Medications:    Current Facility-Administered Medications:  acetaminophen (TYLENOL) tablet 650 mg Oral Q6H PRN   aspirin chewable tablet 81 mg 81 mg Oral Daily   cefTRIAXone (ROCEPHIN) 1 g in iso-osmotic 50 mL premix IVPB 1 g Intravenous Q24H   clopidogrel (PLAVIX) 75 mg tablet 75 mg Oral Daily   heparin 5,000 unit/mL injection 5,000 Units Subcutaneous Q8HRS   LORazepam (ATIVAN) 2 mg/mL injection 0.5 mg Intravenous Q4H PRN   nitroGLYCERIN (NITROSTAT) sublingual tablet 0.4 mg Sublingual Q5 Min PRN   NS bolus infusion 1,000 mL 1,000 mL Intravenous Once   NS flush syringe 10 mL Intravenous Q8HRS   NS flush syringe 10 mL Intravenous Q1H PRN   NS premix infusion  Intravenous Continuous   pantoprazole (PROTONIX) delayed release tablet 40 mg Oral Daily   rosuvastatin (CRESTOR) tablet 20 mg Oral QPM       Today's Physical Exam:  GENERAL: Patient is alert, awake and oriented X 3. In no cardio respiratory distress. 64 year old AAF in no distress.   HEENT: PERRLA, EOMI.   NECK: Supple, NO JVD.   HEART: S1+, S2+, rate controlled and rhythm regular. No murmurs appreciated.   LUNGS: Bilateral breath sounds fair, no crackles or wheezing.   ABDOMEN: Soft, NT, ND with NABS.   EXTREMITIES: No edema, pedal pulses palpable.   NEURO:  Motor exam is non focal.   SKIN: No rashes or bruises.   PSYCH: Pleasant with no signs of depression.        I/O:  I/O last 24  hours:    Intake/Output Summary (Last 24 hours) at 02/11/15 1924  Last data filed at 02/11/15 1752   Gross per 24 hour   Intake 1998.8 ml   Output    300 ml   Net 1698.8 ml     I/O current shift:  08/18 1600 - 08/18 2359  In: 240 [P.O.:240]  Out: -     Nutrition/Residuals:  DIET RENAL (75GM PROT;2GM K: 2GM NA)    Labs -   reviewed  Reviewed:   Lab Results for Last 24 Hours:    Results for orders placed or performed during the hospital encounter of 02/10/15 (from the past 24 hour(s))   URINALYSIS, MACROSCOPIC   Result Value Ref Range    COLOR Yellow Yellow, Light Yellow    APPEARANCE Cloudy (A) Clear    PH 5.0 <8.0    LEUKOCYTES Large (A) Negative WBCs/uL    NITRITE Negative Negative    PROTEIN 100  (A) Negative mg/dL    GLUCOSE Negative Negative mg/dL    KETONES Negative Negative mg/dL    UROBILINOGEN < 2.0 <=1.6 mg/dL    BILIRUBIN Negative Negative mg/dL    BLOOD Small (A) Negative mg/dL  SPECIFIC GRAVITY 1.009 <1.022   URINALYSIS, MICROSCOPIC   Result Value Ref Range    RBCS 10-20 (A) 0-2 /hpf    WBCS >100 (A) 0-2 /hpf    BACTERIA Moderate (A) None /hpf    SQUAMOUS EPITHELIAL 2-5 (A) 0-2/hpf /hpf    TRANSITIONAL EPITHELIAL 2-5 (A) 0-2/hpf /hpf    MUCOUS Slight (A) None /hpf    AMORPHOUS SEDIMENT Slight (A) None /hpf    HYALINE CASTS 2-5 (A) None, 0-2 /lpf   COMPREHENSIVE METABOLIC PANEL, NON-FASTING   Result Value Ref Range    SODIUM 137 136-145 mmol/L    POTASSIUM 3.4 3.4-5.1 mmol/L    CHLORIDE 101 101-111 mmol/L    CO2 TOTAL 23 22-32 mmol/L    ANION GAP 13 (H) 3-11 mmol/L    BUN 43 (H) 6-20 mg/dL    CREATININE 5.40 (H) 0.44-1.00 mg/dL    BUN/CREA RATIO 6 9-81    ESTIMATED GFR 7 (L) >60 mL/min/1.63m2    ALBUMIN 2.7 (L) 3.5-5.0 g/dL    CALCIUM 7.0 (L) 1.9-14.7 mg/dL    GLUCOSE 91 82-956 mg/dL    ALKALINE PHOSPHATASE 103 38-126 U/L    ALT (SGPT) 17 14-54 U/L    AST (SGOT) 21 15-41 U/L    BILIRUBIN TOTAL 0.6 0.3-1.2 mg/dL    PROTEIN TOTAL 5.1 (L) 6.4-8.3 g/dL    ALBUMIN/GLOBULIN RATIO 1.1 0.8-2.0   MAGNESIUM      Result Value Ref Range    MAGNESIUM 1.3 (L) 1.4-2.1 mg/dL   PHOSPHORUS   Result Value Ref Range    PHOSPHORUS 6.7 (H) 2.7-4.5 mg/dL   CBC WITH DIFF   Result Value Ref Range    WBC 11.9 (H) 4.0-11.0 x103/uL    RBC 3.73 (L) 4.00-5.10 x106/uL    HGB 9.4 (L) 12.0-15.5 g/dL    HCT 21.3 (L) 08.6-57.8 %    MCV 75.3 (L) 82.0-97.0 fL    MCH 25.2 (L) 27.5-33.2 pg    MCHC 33.5 32.0-36.0 g/dL    RDW 46.9 (H) 62.9-52.8 %    PLATELETS 302 150-450 x103/uL    MPV 7.9 7.4-10.5 fL    NEUTROPHIL % 76 43-76 %    LYMPHOCYTE % 12 (L) 15-43 %    MONOCYTE % 11 5-12 %    EOSINOPHIL % 1 0-5 %    BASOPHIL % 0 0-3 %    NEUTROPHIL # 9.00 (H) 1.50-6.50 x103/uL    LYMPHOCYTE # 1.40 1.00-4.80 x103/uL    MONOCYTE # 1.30 (H) 0.20-0.90 x103/uL    EOSINOPHIL # 0.20 0.00-0.50 x103/uL    BASOPHIL # 0.00 0.00-0.10 x103/uL   C. DIFFICILE PCR TESTING - [facility] ONLY   Result Value Ref Range    CLOSTRIDIUM DIFFICILE TOXIN, PCR Negative Negative, Indeterminate    PRESUMPTIVE 027/NAP1/BI Negative Not Detected, Negative     Ordered:        Diagnostic Tests - reviewed      Radiology Tests - reviewed        Problem List:  Active Hospital Problems   (*Primary Problem)    Diagnosis    *Acute renal failure    Hypomagnesemia    Leukocytosis    Diarrhea    Hypokalemia    HTN (hypertension)    Coronary artery disease     H/O PCI with stent to mid LAD         Assessment/ Plan:     Acute Renal Failure - of unclear etiology - ATN, some prerenal component are in differential. Due to Pyuria -  acute interstitial nephritis is suspected by Dr. Webb Silversmith. Nephrology consult appreciated.    Hypokalemia and hypomagnesemia - replace.     ? UTI - on empiric Antibiotics.    B 12  Deficiency - replaced. Added PO supplements.     H/O CAD - continue home meds. Hold Cozaar.     Obesity - encourage weight loss.     PLAN:    Follow serial labs.     Avoid nephrotoxic drugs.     DVT/PE Prophylaxis: Heparin

## 2015-02-11 NOTE — Care Plan (Signed)
Problem: Pain, Chronic (Adult)  Intervention: Mutually Develop/Implement Persistent Pain Management Plan    02/11/15 2141   OTHER   Pain Intervention  Medication (see eMAR);Distraction;Relaxation Technique;Rest;Repositioned   Cognitive Interventions   Sensory Stimulation Regulation care clustered;lighting decreased;quiet environment promoted;music/television provided for relaxation

## 2015-02-11 NOTE — Care Plan (Signed)
Problem: Pain, Chronic (Adult)  Intervention: Support Psychosocial Response to Persistent Pain    02/11/15 2141   Coping Strategies   Supportive Measures active listening utilized;decision-making supported;self-responsibility promoted;self-care encouraged;relaxation techniques promoted;verbalization of feelings encouraged   Diversional Activities television

## 2015-02-11 NOTE — Care Management Notes (Signed)
02/11/15 1400   Assessment Detail   Assessment Type Admission   Date of Care Management Update 02/11/15   Social Work Plan   Discharge Planning Status initial meeting   Anticipated Discharge Disposition Home   CM will evaluate for rehabilitation potential no   Discharge Needs Assessment   Concerns To Be Addressed denies needs/concerns at this time   Equipment Currently Used at Home none   Currently on Outpatient Dialysis? No   Transportation Available car   Referral Information   Admission Type inpatient   Arrived From admitted as an inpatient   Address Verified verified-no changes   Insurance Verified verified-no change   Source of Information Patient   Living Environment   Lives With child(ren), adult;other (see comments)   Living Arrangements house   Quality Of Family Relationships supportive   Able to Return to Prior Living Arrangements yes   Met with patient to discuss discharge needs. Patient resides with her daughter, son-n-law and 4 grandchildren in a split foyer home. Currently no medical equipment for home use. Patient does drive. She [facility] [place] and also does mail order for maintenance medications with [facility]. Patient denies discharge needs at this time. Will continue to follow and await any consults.

## 2015-02-11 NOTE — Care Plan (Signed)
Problem: Pain, Chronic (Adult)  Goal: Acceptable Pain Control/Comfort Level  Patient will demonstrate the desired outcomes by discharge/transition of care.   Outcome: Ongoing (see interventions/notes)    02/11/15 2140   Pain, Chronic (Adult)   Acceptable Pain Control/Comfort Level making progress toward outcome

## 2015-02-11 NOTE — H&P (Signed)
[facility]  General Medicine  Admission H&P    Date of Service:  02/10/15  [name], 64 y.o. female  Date of Admission:  02/10/2015  Date of Birth:  12/30/1950  PCP: Sid Falcon, MD    Information Obtained from: patient   Chief Complaint:  Referred by PCP for abnormal lab results    HPI: [name] is a 64 y.o., Black/African American female who presents with vague abdominal pain and chills.  She was evaluated as outpt by Dr Tharon Aquas and labs obtained, she was later called and instructed to come to the ED for evaluation. Admits to recent diarrhea.  She denies vomiting or recent change in medications.  She has known h/o CAD, HTN, HLD and obesity.  Repeat creatinine drawn in ED reveals marked elevation at 7.34 and BUN at 45.  Creatinine was 0.83 10/2013.  UA reveals UTI and WBC was elevated.        PAST MEDICAL:    Past Medical History   Diagnosis Date    HTN (hypertension)     Coronary artery disease     Wears glasses     Hyperlipemia     Obesity     Mixed hyperlipidemia 07/30/2013       Past Surgical History   Procedure Laterality Date    Hx heart catheterization       stent in June 2014    Hx hysterectomy      Hx tmj arthotomy  x2    Hx laminoplasty      Hx cervical diskectomy         Medications Prior to Admission     Prescriptions    CHILDREN'S ASPIRIN 81 mg Oral Tablet, Chewable    TAKE 1 TABLET DAILY    clopidogrel (PLAVIX) 75 mg Oral Tablet    Take 1 Tab (75 mg total) by mouth Once a day .Follow Blood work with PCP.    CRESTOR 20 mg Oral Tablet    TAKE 1 TABLET EVERY EVENING    cyclobenzaprine (FLEXERIL) 10 mg Oral Tablet    Take 10 mg by mouth Three times a day    Diclofenac Sodium 3 % Gel    3 % by Apply externally route Four times a day    levomilnacipran (FETZIMA) 40 mg Oral Capsule,Sustained Action 24 hr    Take 40 mg by mouth Once a day    losartan (COZAAR) 50 mg Oral Tablet    Take 50 mg by mouth Once a day    metoprolol (LOPRESSOR) 100 mg Oral Tablet    Take 1.5  Tabs (150 mg total) by mouth Twice daily for 90 days    nitroglycerin (NITROSTAT) 0.3 mg Sublingual Tablet, Sublingual    1 Tab (0.3 mg total) by Sublingual route Every 5 minutes as needed for Chest pain for 3 doses over 15 minutes    pantoprazole (PROTONIX) 40 mg Oral Tablet, Delayed Release (E.C.)    Take 1 Tab (40 mg total) by mouth Once a day    Patient taking differently:  Take 20 mg by mouth Once a day     spironolactone (ALDACTONE) 25 mg Oral Tablet    Take 1 Tab (25 mg total) by mouth Once a day for 30 days    diclofenac sodium (VOLTAREN) 75 mg Oral Tablet, Delayed Release (E.C.)    Take 75 mg by mouth Twice daily        Allergies   Allergen Reactions    Capoten [  Captopril]     Demerol [Meperidine]     Dilaudid [Hydromorphone]     Tramadol Diarrhea     Vaccinations:        Pneumovax: Received before 65, not due yet.    Influenza: Not current vaccination season.    Family History  Family History   Problem Relation Age of Onset    Hypertension Sister     Diabetes Sister     Hypertension Maternal Grandmother     Coronary Artery Disease Maternal Grandmother      elderly    Coronary Artery Disease Maternal Uncle      elderly         Social History  History     Social History    Marital Status: Widowed     Spouse Name: N/A    Number of Children: N/A    Years of Education: N/A     Occupational History    Not on file.     Social History Main Topics    Smoking status: Never Smoker     Smokeless tobacco: Never Used    Alcohol Use: No    Drug Use: No    Sexual Activity: Not on file     Other Topics Concern    Not on file     Social History Narrative       ROS: Other than ROS in the HPI, all other systems were negative.    Examination:  Temperature: 36.4 C (97.5 F)  Heart Rate: (!) 103  BP (Non-Invasive): (!) 147/86 mmHg  Respiratory Rate: 19  SpO2-1: 100 %  Pain Score (Numeric, Faces): 0  General: acutely ill, moderately obese, appears younger than stated age, mild distress and vital signs reviewed  and discussed with patient  Eyes: Conjunctiva clear., Pupils equal and round, reactive to light and accomodation. , Sclera non-icteric.   HENT:Head atraumatic and normocephalic, ENT without erythema or injection, mucous membranes moist.  Neck: No JVD or thyromegaly or lymphadenopathy  Lungs: Clear to auscultation and percussion bilaterally.   Cardiovascular: regular rate and rhythm, S1, S2 normal, no murmur, click, rub or gallop  Abdomen: Soft, non-tender, Bowel sounds normal, non-distended, No hepatosplenomegaly  Genito-urinary: Deferred  Extremities: No cyanosis or edema, No synovitis or joint effusions  Skin: Skin warm and dry  Neurologic: CN II - XII grossly intact , Normal mental status, sensory, motor, cranial nerves, reflexes, coordination, and gait., Alert and oriented x3, No tremor, No asterixis  Lymphatics: No lymphadenopathy  Psychiatric: Normal affect, behavior, memory, thought content, judgement, and speech.    Labs:    I have reviewed all lab results.  Lab Results for Last 24 Hours:    Results for orders placed or performed during the hospital encounter of 02/10/15 (from the past 24 hour(s))   CREATINE KINASE (CK), TOTAL, SERUM   Result Value Ref Range    CREATINE KINASE 88 38-234 U/L   COMPREHENSIVE METABOLIC PANEL, NON-FASTING   Result Value Ref Range    SODIUM 134 (L) 136-145 mmol/L    POTASSIUM 3.2 (L) 3.4-5.1 mmol/L    CHLORIDE 94 (L) 101-111 mmol/L    CO2 TOTAL 24 22-32 mmol/L    ANION GAP 16 (H) 3-11 mmol/L    BUN 45 (H) 6-20 mg/dL    CREATININE 1.61 (H) 0.44-1.00 mg/dL    BUN/CREA RATIO 6 0-96    ESTIMATED GFR 7 (L) >60 mL/min/1.11m2    ALBUMIN 3.2 (L) 3.5-5.0 g/dL    CALCIUM 7.6 (L) 0.4-54.0 mg/dL  GLUCOSE 94 70-110 mg/dL    ALKALINE PHOSPHATASE 125 38-126 U/L    ALT (SGPT) 21 14-54 U/L    AST (SGOT) 24 15-41 U/L    BILIRUBIN TOTAL 1.0 0.3-1.2 mg/dL    PROTEIN TOTAL 6.1 (L) 6.4-8.3 g/dL    ALBUMIN/GLOBULIN RATIO 1.1 0.8-2.0   MAGNESIUM   Result Value Ref Range    MAGNESIUM 1.5 1.4-2.1 mg/dL    PHOSPHORUS   Result Value Ref Range    PHOSPHORUS 6.8 (H) 2.7-4.5 mg/dL   LIPASE   Result Value Ref Range    LIPASE 23 22-51 U/L   TROPONIN-I   Result Value Ref Range    TROPONIN I <0.03 <=0.06 ng/mL   CBC WITH DIFF   Result Value Ref Range    WBC 14.8 (H) 4.0-11.0 x103/uL    RBC 4.44 4.00-5.10 x106/uL    HGB 10.9 (L) 12.0-15.5 g/dL    HCT 16.1 (L) 09.6-04.5 %    MCV 75.2 (L) 82.0-97.0 fL    MCH 24.6 (L) 27.5-33.2 pg    MCHC 32.7 32.0-36.0 g/dL    RDW 40.9 (H) 81.1-91.4 %    PLATELETS 357 150-450 x103/uL    MPV 7.7 7.4-10.5 fL    NEUTROPHIL % 76 43-76 %    LYMPHOCYTE % 11 (L) 15-43 %    MONOCYTE % 12 5-12 %    EOSINOPHIL % 1 0-5 %    BASOPHIL % 0 0-3 %    NEUTROPHIL # 11.20 (H) 1.50-6.50 x103/uL    LYMPHOCYTE # 1.60 1.00-4.80 x103/uL    MONOCYTE # 1.70 (H) 0.20-0.90 x103/uL    EOSINOPHIL # 0.20 0.00-0.50 x103/uL    BASOPHIL # 0.00 0.00-0.10 x103/uL   BLUE TOP TUBE   Result Value Ref Range    RAINBOW/EXTRA TUBE AUTO RESULT Yes    RED TOP TUBE   Result Value Ref Range    RAINBOW/EXTRA TUBE AUTO RESULT Yes    VITAMIN B12   Result Value Ref Range    VITAMIN B 12 188 180-914 pg/mL   FOLATE   Result Value Ref Range    FOLATE 14.4 >=4.5 ng/mL   THYROID STIMULATING HORMONE WITH FREE T4 REFLEX   Result Value Ref Range    TSH 0.767 0.340-5.600 uIU/mL   URINALYSIS, MACROSCOPIC   Result Value Ref Range    COLOR Yellow Yellow, Light Yellow    APPEARANCE Cloudy (A) Clear    PH 5.0 <8.0    LEUKOCYTES Large (A) Negative WBCs/uL    NITRITE Negative Negative    PROTEIN 100  (A) Negative mg/dL    GLUCOSE Negative Negative mg/dL    KETONES Negative Negative mg/dL    UROBILINOGEN < 2.0 <=7.8 mg/dL    BILIRUBIN Negative Negative mg/dL    BLOOD Small (A) Negative mg/dL    SPECIFIC GRAVITY 2.956 <1.022   URINALYSIS, MICROSCOPIC   Result Value Ref Range    RBCS 10-20 (A) 0-2 /hpf    WBCS >100 (A) 0-2 /hpf    BACTERIA Moderate (A) None /hpf    SQUAMOUS EPITHELIAL 2-5 (A) 0-2/hpf /hpf    TRANSITIONAL EPITHELIAL 2-5 (A) 0-2/hpf /hpf      MUCOUS Slight (A) None /hpf    AMORPHOUS SEDIMENT Slight (A) None /hpf    HYALINE CASTS 2-5 (A) None, 0-2 /lpf       Imaging Studies:  CT OF THE ABDOMEN AND PELVIS WITHOUT CONTRAST (DLP: 1327.3 mGy-cm): HISTORY: Left lower quadrant abdominal pain.    FINDINGS: The lung bases are clear.  There is coronary calcification seen. The liver is normal. There is sludge within the gallbladder. Gallbladder is not distended. There is no pericholecystic fluid or biliary dilatation. The spleen, pancreas, adrenal glands and kidneys are normal. There is evidence of bowel surgery in the left lower quadrant. There is no evidence for bowel obstruction. No diverticulitis. Degenerative changes are seen in the lumbar spine. There is evidence of prosthetic intervertebral disk at L5-S1 level.    IMPRESSION:    1. No diverticulitis.  2. Sludge in the gallbladder.   3. There might be mild thickening of the urinary bladder.    DNR Status:  Full Code    Assessment/Plan:   Active Hospital Problems   (*Primary Problem)    Diagnosis    *Acute renal failure    Hypomagnesemia    Leukocytosis    Diarrhea    Hypokalemia    HTN (hypertension)    Coronary artery disease     H/O PCI with stent to mid LAD         Acute Renal Failure  --Associated with UTI  --Antibiotic IV for UTI  --IV fluid resuscitation  --Electrolytes were WNL x low K+ and Mg++, continue to monitor  --Consult Nephrology  --Stop NSAIDs and avoid nephrotoxins  --No indication for urgent dialysis  --CT does not suggest hydro, will order [place]    CAD  --Continue meds  --Will hold ARB        DVT/PE Prophylaxis: Heparin

## 2015-02-11 NOTE — Progress Notes (Signed)
Consult Dictated:  Impressions:  1. ARF, severe, probably due to NSAIDs. Has been on combined oral and gel Diclofenac, and oral dose was increased to twice a day in July. Also takes some intermittent OTC Aleve.   2. Pyuria without symptoms of UTI - suspect she has AIN from NSAIDs.   3. History of hypokalemia and metabolic alkalosis. 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   4. HTN  Recommendations:  1. Agree with stopping NSAIDs and diuretics, and giving NS IVF.  2. No need for HD at this time, and hopefully she will recover quickly.   3. Dose meds for est Clcr < 10 cc/min  4. Avoid NSAID use in future  5. I can work up hyperaldosteronism as outpatient.

## 2015-02-11 NOTE — Care Plan (Signed)
Problem: General Plan of Care(Adult,OB)  Goal: Plan of Care Review(Adult,OB)  The patient and/or their representative will communicate an understanding of their plan of care   Outcome: Ongoing (see interventions/notes)  Met with patient to discuss discharge needs. Patient resides with her daughter, son-n-law and 4 grandchildren in a split foyer home. Currently no medical equipment for home use. Patient does drive. [facility] [place] and also does mail order for maintenance medications with [facility]. Patient denies discharge needs at this time. Will continue to follow and await any consults.

## 2015-02-11 NOTE — Consults (Signed)
[facility]                                  [place], [place] [address]                                     [phone]                             CONSULTATION    PATIENT NAME: [name], [name] [facility] NUMBER:[id number]  DATE OF SERVICE:02/11/2015  DATE OF BIRTH: 1950-12-29    NEPHROLOGY CONSULTATION    The consult was requested by Dr. Bing Quarry for severe acute renal failure and electrolyte abnormalities.    HISTORY OF PRESENT ILLNESS:  The patient  is a 64 year old female with a history of hypertension, coronary artery disease, and hypokalemic metabolic alkalosis.  Last year, when her potassium was 2.5, she was switched from hydrochlorothiazide to spironolactone.  She also has been taking combined oral diclofenac and diclofenac gel.  The oral diclofenac was once a day, which she has been taking for many years.  Last month, the oral dose was increased to twice a day because of her arthritis.  She also takes intermittent doses of Aleve once every week or 2.  She started having some lower mid and left abdominal pain along with some chills and some hand numbness.  She also had some diarrhea.  She also notices weight gain and intermittent dizziness.  She denies having dysuria, frequency or urgency.  She has had a history of urinary tract infection one time in the past, but she says her current symptoms do not feel the same as that previous episode.  She had labs done by her primary care provider and because of the abnormalities noted, she was directed to the Emergency Department yesterday.  There, she was found to have a BUN and creatinine of 45 and 7.34 respectively, potassium of 3.2 and CO2 of 24.  Spironolactone was held.  The nonsteroidal anti-inflammatory drugs were held, and she was admitted and given normal saline overnight.  She is feeling a little bit better today.  She has no uremic symptoms.    PAST MEDICAL HISTORY:   Hypokalemia, coronary artery disease, hyperlipidemia, metabolic alkalosis.    PAST SURGICAL HISTORY:  Hysterectomy, laminectomy.    SOCIAL HISTORY:  No tobacco or alcohol.    FAMILY HISTORY:  No known kidney failure.    MEDICATIONS:  See EMR.    ALLERGIES:  See EMR.    REVIEW OF SYSTEMS:  See HPI above.  Currently without nausea, vomiting, abnormal taste, chest pain, shortness of breath, abdominal pain, flank pain, dysuria, frequency or urgency.  Reports that her urine seems clear.  No recent edema.    PHYSICAL EXAMINATION:  An alert female, in no distress.  Temperature 36.4 degrees Celsius, pulse 104 regular, respiratory rate 18, blood pressure 149/84, pulse ox 100%, weight 92.5 kilograms.  No asterixis.    HEENT:  Unremarkable.  No jaundice.  Mucous membranes moist.    LUNGS:  Clear without inspiratory crackles or expiratory wheezes.    HEART:  Regular rate and rhythm.  No pericardial friction rub.    ABDOMEN:  Obese.  Positive bowel sounds.  No flank bruits, no guarding, rebound or masses.    EXTREMITIES:  No peripheral edema.    SKIN:  Warm and dry, no inflammatory arthritis.  No rashes.    LABORATORY DATA:  WBC 11,900, hemoglobin 9.4, platelet count 302,000, MCV 75.3.  Sodium 134, potassium 3.2, chloride 94, CO2 24, BUN 45, creatinine 7.34, estimated GFR 7 mL per minute, glucose 94, calcium 7.6, phosphorus 6.8, albumin 3.2.  Urinalysis -- specific gravity 1.009, pH 5.01, 100 mg/dL protein, 16-10 red blood cells, greater than 100 white blood cells, nitrate negative, leukocyte esterase positive.      IMAGING DATA:  CT scan of the abdomen and pelvis, adrenals and kidneys unremarkable.  There was coronary calcification.    IMPRESSION:  1.  Acute renal failure, severe, probably due to non-steroidal anti-inflammatory drugs.  She has been on combined oral and gel diclofenac, and the oral dose of diclofenac was recently increased to twice a day, and she has also had intermittent doses of over-the-counter nonsteroidal  anti-inflammatory drugs.  2.  Pyuria without symptoms of urinary tract infection.  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  3.  History of hypokalemia and metabolic alkalosis with mild hypokalemia on admission despite severe acute renal failure and spironolactone use.  Suspect that she has hyperaldosteronism from adrenal hyperplasia, given the absence of adrenal nodules on the CT scan.  4.  Hypertension.    RECOMMENDATION:  Agree with stopping nonsteroidal anti-inflammatory drugs and diuretics and giving normal saline IV fluids.  No need for hemodialysis at this time.  We will monitor closely.  Hopefully, she will recover without the need of dialysis.  Dose medications for an estimated creatinine clearance less than 10 mL per minute.  Avoid nonsteroidal anti-inflammatory drug use in the future, including diclofenac and Aleve.  I can evaluate her probable hyperaldosteronism as an outpatient.    Thank you for consulting me on this patient.  Let me know if you have any questions.      Weston Brass, MD      RU/[id number]; D: 02/11/2015 08:49:04; T: 02/11/2015 22:33:46    cc: Tinnie Gens Debord DO      Shirleen Schirmer

## 2015-02-11 NOTE — Care Plan (Signed)
Problem: Pain, Chronic (Adult)  Intervention: Manage Persistent Pain Analgesia    02/11/15 2140   Manage Acute Burn Pain   Bowel Intervention ambulation promoted;adequate fluid intake promoted;privacy promoted   Safety Interventions   Medication Review/Management medications reviewed   OTHER   Pain Intervention  Distraction;Rest;Relaxation Technique;Medication (see eMAR)

## 2015-02-11 NOTE — Nurses Notes (Signed)
Paged to Dr. Bing Quarry regarding Pt's potassium level 3.2. No new order obtained at this time.

## 2015-02-12 ENCOUNTER — Inpatient Hospital Stay (HOSPITAL_BASED_OUTPATIENT_CLINIC_OR_DEPARTMENT_OTHER): Payer: Commercial Managed Care - PPO

## 2015-02-12 LAB — RENAL FUNCTION PANEL
ALBUMIN: 2.7 g/dL — ABNORMAL LOW (ref 3.5–5.0)
ANION GAP: 13 mmol/L — ABNORMAL HIGH (ref 3–11)
BUN/CREA RATIO: 5 — ABNORMAL LOW (ref 6–22)
BUN: 41 mg/dL — ABNORMAL HIGH (ref 6–20)
CALCIUM: 7.4 mg/dL — ABNORMAL LOW (ref 8.8–10.2)
CHLORIDE: 105 mmol/L (ref 101–111)
CO2 TOTAL: 21 mmol/L — ABNORMAL LOW (ref 22–32)
CREATININE: 7.71 mg/dL — ABNORMAL HIGH (ref 0.44–1.00)
ESTIMATED GFR: 6 mL/min/1.73mˆ2 — ABNORMAL LOW (ref >60)
GLUCOSE: 121 mg/dL — ABNORMAL HIGH (ref 70–110)
PHOSPHORUS: 6.7 mg/dL — ABNORMAL HIGH (ref 2.7–4.5)
POTASSIUM: 3.5 mmol/L (ref 3.4–5.1)
SODIUM: 139 mmol/L (ref 136–145)

## 2015-02-12 LAB — HEPATITIS B SURFACE ANTIGEN: HBV SURFACE ANTIGEN QUALITATIVE: NONREACTIVE

## 2015-02-12 LAB — ALBUMIN FOR ELECTROPHORESIS: ALBUMIN: 2.7 g/dL — ABNORMAL LOW (ref 3.4–4.8)

## 2015-02-12 LAB — MAGNESIUM: MAGNESIUM: 2.4 mg/dL — ABNORMAL HIGH (ref 1.4–2.1)

## 2015-02-12 LAB — PROTEIN FOR ELECTROPHORESIS: PROTEIN TOTAL: 5.3 g/dL — ABNORMAL LOW (ref 5.6–7.6)

## 2015-02-12 MED ORDER — LIDOCAINE 1 %-EPINEPHRINE 1:100,000 INJECTION SOLUTION
15.0000 mL | INTRAMUSCULAR | Status: AC
Start: 2015-02-12 — End: 2015-02-12

## 2015-02-12 MED ORDER — ROSUVASTATIN 20 MG TABLET
10.00 mg | ORAL_TABLET | Freq: Every evening | ORAL | Status: DC
Start: 2015-02-12 — End: 2015-02-18
  Administered 2015-02-12 – 2015-02-17 (×6): 10 mg via ORAL
  Filled 2015-02-12 (×6): qty 1

## 2015-02-12 MED ORDER — CALCIUM ACETATE(PHOSPHATE BINDERS) 667 MG CAPSULE
1334.00 mg | ORAL_CAPSULE | Freq: Three times a day (TID) | ORAL | Status: DC
Start: 2015-02-12 — End: 2015-02-18
  Administered 2015-02-12: 1334 mg via ORAL
  Administered 2015-02-12: 0 mg via ORAL
  Administered 2015-02-13 – 2015-02-17 (×14): 1334 mg via ORAL
  Administered 2015-02-17 – 2015-02-18 (×2): 0 mg via ORAL
  Administered 2015-02-18 (×2): 1334 mg via ORAL
  Filled 2015-02-12 (×15): qty 2

## 2015-02-12 MED ORDER — LIDOCAINE HCL 10 MG/ML (1 %) INJECTION SOLUTION
2.0000 mL | INTRAMUSCULAR | Status: DC
Start: 2015-02-12 — End: 2015-02-12

## 2015-02-12 MED ORDER — LIDOCAINE 1 %-EPINEPHRINE 1:100,000 INJECTION SOLUTION
INTRAMUSCULAR | Status: AC
Start: 2015-02-12 — End: 2015-02-12
  Administered 2015-02-12: 4 mL via INTRAMUSCULAR
  Filled 2015-02-12: qty 20

## 2015-02-12 MED ORDER — HEPARIN (PORCINE) 1,000 UNIT/ML INJECTION FOR DIALYSIS
3200.0000 [IU] | INTRAMUSCULAR | Status: AC
Start: 2015-02-12 — End: 2015-02-12
  Administered 2015-02-12: 3200 [IU] via INTRAVENOUS

## 2015-02-12 MED ORDER — HEPARIN, PORCINE (PF) 10 UNIT/ML INTRAVENOUS SYRINGE
INJECTION | INTRAVENOUS | Status: AC
Start: 2015-02-12 — End: 2015-02-12
  Administered 2015-02-12: 5 mL
  Filled 2015-02-12: qty 5

## 2015-02-12 MED ORDER — LABETALOL 100 MG TABLET
100.00 mg | ORAL_TABLET | Freq: Three times a day (TID) | ORAL | Status: DC
Start: 2015-02-12 — End: 2015-02-13
  Administered 2015-02-12 – 2015-02-13 (×3): 100 mg via ORAL
  Filled 2015-02-12 (×3): qty 1

## 2015-02-12 MED ORDER — HEPARIN (PORCINE) 5,000 UNIT/ML INJECTION SOLUTION FOR DIALYSIS
2.4000 mL | INTRAMUSCULAR | Status: AC
Start: 2015-02-12 — End: 2015-02-12
  Administered 2015-02-12: 2.4 mL

## 2015-02-12 MED ORDER — HEPARIN (PORCINE) 5,000 UNIT/ML INJECTION SOLUTION
5000.0000 [IU] | INTRAMUSCULAR | Status: AC
Start: 2015-02-12 — End: 2015-02-12
  Administered 2015-02-12 (×2): 5000 [IU] via SUBCUTANEOUS

## 2015-02-12 MED ORDER — SODIUM CHLORIDE 0.9 % INTRAVENOUS SOLUTION
INTRAVENOUS | Status: DC
Start: 2015-02-12 — End: 2015-02-12

## 2015-02-12 MED ORDER — HEPARIN, PORCINE (PF) 10 UNIT/ML INTRAVENOUS SYRINGE
2.0000 mL | INJECTION | INTRAVENOUS | Status: AC
Start: 2015-02-12 — End: 2015-02-12

## 2015-02-12 MED ORDER — LIDOCAINE HCL 10 MG/ML (1 %) INJECTION SOLUTION
INTRAMUSCULAR | Status: DC
Start: 2015-02-12 — End: 2015-02-12
  Filled 2015-02-12: qty 20

## 2015-02-12 MED ADMIN — morphine 4 mg/mL intravenous syringe: ORAL | @ 09:00:00

## 2015-02-12 MED ADMIN — diphenhydrAMINE 50 mg/mL injection solution: INTRAMUSCULAR | @ 14:00:00

## 2015-02-12 NOTE — Progress Notes (Signed)
[facility]  Nephrology Consult Follow Up Note      Date of service: 02/12/2015  Hospital Day:  LOS: 2 days     SUBJECTIVE: Continued to receive IVF overnight. BP high normal. Denies shortness of breath. Diarrhea imprved. BC and UC no growth to date. Reports she is voiding more, but urine not quantified.     OBJECTIVE:    Vital Signs:  Temp (24hrs) Max:36.9 C (98.5 F)      Temperature: 36.9 C (98.4 F)  BP (Non-Invasive): (!) 146/82 mmHg  MAP (Non-Invasive): 98 mmHG  Heart Rate: (!) 110  Respiratory Rate: 18  Pain Score (Numeric, Faces): 0  SpO2-1: 94 %    Systolic (24hrs), Avg:153 mmHg, Min:146 mmHg, Max:163 mmHg    Diastolic (24hrs), Avg:87 mmHg, Min:82 mmHg, Max:90 mmHg    MAP:  MAP (Non-Invasive)  Avg: 104 mmHG  Min: 98 mmHG  Max: 109 mmHG  Heart Rate:  Pulse  Avg: 107.3  Min: 100  Max: 116    Dialysis treatment total fluid removal:     Post dialysis weight:       Diet Order:  DIET RENAL (75GM PROT;2GM K: 2GM NA)  Nutrition:    Orders Placed This Encounter   No orders of the following type(s) were placed in this encounter: Nourishments.     IV Fluids, Meds, and Drips:      I/O:  I/O last 24 hours:    Intake/Output Summary (Last 24 hours) at 02/12/15 0903  Last data filed at 02/12/15 0500   Gross per 24 hour   Intake   2651 ml   Output      0 ml   Net   2651 ml     I/O current shift:     I/O last 3 completed shifts:  In: 3381 [P.O.:720; I.V.:2661]  Out: 300 [Urine:300]    Labs:  I have reviewed all lab results.    Imaging:  N/A    Current Medications:    Current Facility-Administered Medications:  acetaminophen (TYLENOL) tablet 650 mg Oral Q6H PRN   aspirin chewable tablet 81 mg 81 mg Oral Daily   calcium acetate (PHOSLO) capsule 1,334 mg Oral 3x/day-Meals   clopidogrel (PLAVIX) 75 mg tablet 75 mg Oral Daily   heparin 5,000 unit/mL injection 5,000 Units Subcutaneous Q8HRS   LORazepam (ATIVAN) 2 mg/mL injection 0.5 mg Intravenous Q4H PRN   nitroGLYCERIN (NITROSTAT) sublingual tablet 0.4 mg Sublingual  Q5 Min PRN   NS bolus infusion 1,000 mL 1,000 mL Intravenous Once   NS flush syringe 10 mL Intravenous Q8HRS   NS flush syringe 10 mL Intravenous Q1H PRN   rosuvastatin (CRESTOR) tablet 10 mg Oral QPM       Physical Exam:  General: no distress  Lungs: Clear to auscultation bilaterally.   Cardiovascular: S4 present  Abdomen: Soft, non-tender  Extremities: pedal edema trace+ bilateral  Skin: Skin warm and dry  Neurologic: No asterixis    Impression/Recommendations:  1. ARF, severe, due to Allergic Interstitial Nephritis. She was on two medications that could cause this - the NSAIDs and Protonix, with NSAIDs being the most likely culprit. Interesting, she had culture negative pyuria last year. CXR without abnormalities consistent with Sarcoid or TB. I have orderedC3, C4, ANA, ANCA, SPEP to evaluate for other less likely etiologies. Not able to do kidney biopsy because of aspirin and Plavix. Treatment includes stopping probable and possible medications (NSAIDs and PPI), providing supportive care, and possibly trying steroids if no signs  of improvement in about a week after stopping ofending drugs, although NSAID induced AIN less responsive to steroids. Due to worsening GFR that is already down to 6 cc/min, will place Trialysis catheter and do first HD today - she gave verbal consent. Will consult Care Manager in case outpatient dialysis needed for ARF support. Daily renal panel next few days to monitor for early recovery of kidney function.   2. Pyuria due to AIN - antibiotics stopped.   3. HTN. I stopped IVF since she had S4 on exam today. Try to not give diuretics or BP meds unless volume overload occurs or HTN worsens.   4. Hyperphosphatemia. Renal diet. Phoslo started.

## 2015-02-12 NOTE — Progress Notes (Signed)
[facility]    IP PROGRESS NOTE      West Bali  Date of Admission:  02/10/2015  Date of Birth:  1950-11-12  Date of Service:  02/12/2015    Chief Complaint: Patient with ARF, new onset. Her PCP referred her to ED.       Subjective: ROS: Patient with no new complaints today. She feels tired. Denies any CP or SOB.     Vital Signs:  Temp (24hrs) Max:36.9 C (98.5 F)      Temperature: 36.6 C (97.9 F)  BP (Non-Invasive): (!) 171/94 mmHg  MAP (Non-Invasive): 98 mmHG  Heart Rate: (!) 118  Respiratory Rate: 18  Pain Score (Numeric, Faces): 0  SpO2-1: 99 %    Current Medications:    Current Facility-Administered Medications:  acetaminophen (TYLENOL) tablet 650 mg Oral Q6H PRN   aspirin chewable tablet 81 mg 81 mg Oral Daily   calcium acetate (PHOSLO) capsule 1,334 mg Oral 3x/day-Meals   clopidogrel (PLAVIX) 75 mg tablet 75 mg Oral Daily   heparin 5,000 unit/mL injection 5,000 Units Subcutaneous Q8HRS   LORazepam (ATIVAN) 2 mg/mL injection 0.5 mg Intravenous Q4H PRN   nitroGLYCERIN (NITROSTAT) sublingual tablet 0.4 mg Sublingual Q5 Min PRN   NS bolus infusion 1,000 mL 1,000 mL Intravenous Once   NS flush syringe 10 mL Intravenous Q8HRS   NS flush syringe 10 mL Intravenous Q1H PRN   rosuvastatin (CRESTOR) tablet 10 mg Oral QPM       Today's Physical Exam:  GENERAL: Patient is alert, awake and oriented X 3. In no cardio respiratory distress. 64 year old AAF in no distress.   HEENT: PERRLA, EOMI.   NECK: Supple, NO JVD.   HEART: S1+, S2+, rate controlled and rhythm regular. No murmurs appreciated.   LUNGS: Bilateral breath sounds fair, no crackles or wheezing.   ABDOMEN: Soft, NT, ND with NABS.   EXTREMITIES: No edema, pedal pulses palpable.   NEURO:  Motor exam is non focal.   SKIN: No rashes or bruises.   PSYCH: Pleasant with no signs of depression.        I/O:  I/O last 24 hours:      Intake/Output Summary (Last 24 hours) at 02/12/15 1041  Last data filed at 02/12/15 1000   Gross per 24 hour   Intake    2891 ml   Output      0 ml   Net   2891 ml     I/O current shift:  08/19 0800 - 08/19 1559  In: 240 [P.O.:240]  Out: -     Nutrition/Residuals:  DIET RENAL (75GM PROT;2GM K: 2GM NA)    Labs -   reviewed  Reviewed:   Lab Results for Last 24 Hours:    Results for orders placed or performed during the hospital encounter of 02/10/15 (from the past 24 hour(s))   RENAL FUNCTION PANEL   Result Value Ref Range    SODIUM 139 136-145 mmol/L    POTASSIUM 3.5 3.4-5.1 mmol/L    CHLORIDE 105 101-111 mmol/L    CO2 TOTAL 21 (L) 22-32 mmol/L    ANION GAP 13 (H) 3-11 mmol/L    BUN 41 (H) 6-20 mg/dL    CREATININE 0.98 (H) 0.44-1.00 mg/dL    BUN/CREA RATIO 5 (L) 6-22    ESTIMATED GFR 6 (L) >60 mL/min/1.38m2    CALCIUM 7.4 (L) 8.8-10.2 mg/dL    GLUCOSE 119 (H) 14-782 mg/dL    PHOSPHORUS 6.7 (H) 9.5-6.2 mg/dL  ALBUMIN 2.7 (L) 3.5-5.0 g/dL   MAGNESIUM   Result Value Ref Range    MAGNESIUM 2.4 (H) 1.4-2.1 mg/dL     Ordered:        Diagnostic Tests - reviewed      Radiology Tests - reviewed        Problem List:  Active Hospital Problems   (*Primary Problem)    Diagnosis    *Acute renal failure    Hypomagnesemia    Leukocytosis    Diarrhea    Hypokalemia    HTN (hypertension)    Coronary artery disease     H/O PCI with stent to mid LAD         Assessment/ Plan:     Acute Renal Failure - suspected to be due to acute interstitial nephritis. Due to Pyuria - acute interstitial nephritis is suspected by Dr. Webb Silversmith. Nephrology consult appreciated. As her renal parameters are not improving, she will be having HD, starting today. Trialysis catheter was placed today.     Hypertensive Urgency - patient with high BP prior to HD - Dr. Webb Silversmith will have Hemodialysis done - which will help with fluid removal and in turn BP control.     Hypokalemia and hypomagnesemia - replace as needed.     ? UTI - on empiric Antibiotics.    B 12  Deficiency - replaced. Added PO supplements.     H/O CAD - continue home meds. Hold Cozaar.     Obesity - encourage  weight loss.     PLAN:    Follow serial labs.     Avoid nephrotoxic drugs.     DVT/PE Prophylaxis: Heparin

## 2015-02-12 NOTE — Care Plan (Signed)
Problem: Pain, Chronic (Adult)  Goal: Acceptable Pain Control/Comfort Level  Patient will demonstrate the desired outcomes by discharge/transition of care.   Outcome: Ongoing (see interventions/notes)    02/12/15 2212   Pain, Chronic (Adult)   Acceptable Pain Control/Comfort Level making progress toward outcome

## 2015-02-12 NOTE — Nurses Notes (Signed)
Ir nurses note trialysis    1330 pm pt down for trialysis . Informed consent received  Pt brought over to room a for trialysis catheter placement; see exper for details.    1405 pm procedure complete; report given to helen rn; pt sent up to dialysis by transport.

## 2015-02-12 NOTE — Care Management Notes (Signed)
Consult received for possible need for HD. Call placed to patient's insurance coverage with [employer] [facility]) spoke with [place]. Patient does have HD coverage as follows: covered at 100% of the contracted rate, no co-pay or deductible. If any medications would need to be covered under the patient's drug plan (associated with the HD) then the patient would have a 20% co-pay of the contracted rate. Maximum of $6700.00 in network coverage up to $10,000. Once the patient reaches the cas trophics of the $10,000 then the patient's insurance will pay at 100% for a calendar year. I will not proceed with arranging HD until further consult from Dr Webb Silversmith. Will continue to follow.

## 2015-02-12 NOTE — Care Plan (Signed)
Problem: Pain, Chronic (Adult)  Intervention: Mutually Develop/Implement Persistent Pain Management Plan    02/12/15 2212   OTHER   Pain Intervention  Repositioned;Distraction;Relaxation Technique;Rest;Medication (see eMAR)   Cognitive Interventions   Sensory Stimulation Regulation care clustered;lighting decreased;quiet environment promoted;music/television provided for relaxation

## 2015-02-12 NOTE — Nurses Notes (Addendum)
Call made to this nurse from dialysis regarding patient's BP off 200/111. MD paged and made aware. New orders received, but only to be placed with Dr. Rejeana Brock approval. Dr. Webb Silversmith paged. Verbal orders from Dr. Webb Silversmith not to give anything, and he would call dialysis himself to discuss patient's status.

## 2015-02-12 NOTE — Care Plan (Signed)
Problem: Pain, Chronic (Adult)  Goal: Identify Related Risk Factors and Signs and Symptoms  Related risk factors and signs and symptoms are identified upon initiation of Human Response Clinical Practice Guideline (CPG)   Outcome: Ongoing (see interventions/notes)    02/12/15 2211   Pain, Chronic   Related Risk Factors (Chronic Pain) disease process;procedures/treatments   Signs and Symptoms (Chronic Pain) fatigue/weakness;verbalization of pain descriptors;questions meaning of pain

## 2015-02-12 NOTE — Care Plan (Signed)
Problem: Pain, Chronic (Adult)  Intervention: Support Psychosocial Response to Persistent Pain    02/12/15 2212   Coping Strategies   Supportive Measures active listening utilized;decision-making supported;self-responsibility promoted;self-care encouraged;relaxation techniques promoted;verbalization of feelings encouraged   Diversional Activities television

## 2015-02-12 NOTE — Pharmacy (Signed)
[facility]  Pharmacy Renal Drug Dosing   West Bali, 64 y.o. female  Date of Birth:  1950-07-17  Date of Admission:  02/10/2015  MRN# [id number]    Current height: Height:   Current weight: Weight: 97.342 kg (214 lb 9.6 oz)  Current BMI: BMI (Calculated): 35.79  Dosing weight: Actual:  97.3 kg    Lab Results   Component Value Date    CREATININE 7.71* 02/12/2015    CREATININE 0.83 11/09/2013     Per the [facility] Renal Drug Dosing Policy, the following changes have been made:    Original Order New Order Comments   Crestor  qpm Crestor  qpm                Please contact pharmacy with any questions.    Wilmer Floor, [facility]   02/12/2015, 08:32

## 2015-02-12 NOTE — Nurses Notes (Signed)
02/12/15 1856   Post Dialysis Vitals   Weight (Post Dialysis) 92.6 kg (204 lb 2.3 oz)   Weight Source Bed   Temp 36.3 C (97.3 F)   Temp Source Oral   BP (!) 179/115 mmHg   BP Source (Non-Invasive) C;RA   Pulse 126   RR 18   End Treatment Kt/V 1.06     Hemodialysis Summary:     Pt came to dialysis unit from IR. New catheter access placed and verified via Fluoroscopy. Consent for HD signed by patient. Education provided .   Patient was dialyzed for 4 hours, on 4k bath, bicarb 30, NA 137, dialyzer F160, DFR 700, BFR 400.   Pt's BP elevated, tachycardic prior to HD. 14:48-Telephone order received from Dr. Webb Silversmith with verbal readback to UF as tolerated d/t high BP .   Pt's c/o feeling nauseous on the third  Hr of treatment, tolerated fairly, UFG decreased.    HD completed, UF 2.1L, bvp 90.4.   Post dialysis BP remains elevated, Pt c/o mild headache, report given to [facility].

## 2015-02-12 NOTE — Nurses Notes (Signed)
Call received from IR. Trialysis catheter placed in patient. Patient being transported by IR at this time to dialysis.

## 2015-02-12 NOTE — Care Plan (Signed)
Problem: Pain, Chronic (Adult)  Intervention: Manage Persistent Pain Analgesia    02/12/15 2212   Manage Acute Burn Pain   Bowel Intervention ambulation promoted;adequate fluid intake promoted;privacy promoted   Safety Interventions   Medication Review/Management medications reviewed   OTHER   Pain Intervention  Repositioned;Distraction;Relaxation Technique;Rest;Medication (see eMAR)

## 2015-02-13 LAB — RENAL FUNCTION PANEL
ALBUMIN: 3.2 g/dL — ABNORMAL LOW (ref 3.5–5.0)
ANION GAP: 15 mmol/L — ABNORMAL HIGH (ref 3–11)
BUN/CREA RATIO: 3 — ABNORMAL LOW (ref 6–22)
BUN: 11 mg/dL (ref 6–20)
CALCIUM: 8.9 mg/dL (ref 8.8–10.2)
CHLORIDE: 103 mmol/L (ref 101–111)
CO2 TOTAL: 20 mmol/L — ABNORMAL LOW (ref 22–32)
CREATININE: 4.06 mg/dL — ABNORMAL HIGH (ref 0.44–1.00)
ESTIMATED GFR: 13 mL/min/1.73mˆ2 — ABNORMAL LOW (ref >60)
GLUCOSE: 92 mg/dL (ref 70–110)
PHOSPHORUS: 4.8 mg/dL — ABNORMAL HIGH (ref 2.7–4.5)
POTASSIUM: 3.9 mmol/L (ref 3.4–5.1)
SODIUM: 138 mmol/L (ref 136–145)

## 2015-02-13 MED ORDER — LABETALOL 100 MG TABLET
200.00 mg | ORAL_TABLET | Freq: Two times a day (BID) | ORAL | Status: DC
Start: 2015-02-14 — End: 2015-02-16
  Administered 2015-02-14 (×2): 200 mg via ORAL
  Administered 2015-02-15: 0 mg via ORAL
  Administered 2015-02-15 – 2015-02-16 (×2): 200 mg via ORAL
  Filled 2015-02-13 (×5): qty 2

## 2015-02-13 MED ADMIN — sodium chloride 0.9 % intravenous solution: ORAL | @ 13:00:00

## 2015-02-13 NOTE — Progress Notes (Signed)
[facility]  [place], [place] [address]    Nephrology Progress Note    West Bali  Date of service: 02/13/2015  Hospital Day:  LOS: 3 days       Subjective: Feels well.    Vital Signs:  Temp (24hrs) Max:37.1 C (98.8 F)      Temperature: 36.3 C (97.3 F) (02/13/15 1318)  BP (Non-Invasive): (!) 154/89 mmHg (02/13/15 1318)  MAP (Non-Invasive): 91 mmHG (02/13/15 0939)  Heart Rate: (!) 115 (02/13/15 1318)  Respiratory Rate: 20 (02/13/15 0939)  Pain Score (Numeric, Faces): 0 (02/13/15 0800)  SpO2-1: 100 % (02/13/15 1318)    Physical Exam:  Lungs: Clear to auscultation bilaterally.   Cardiovascular: regular rate and rhythm  Extremities: No cyanosis or edema    I/O:  I/O last 24 hours:    Intake/Output Summary (Last 24 hours) at 02/13/15 1759  Last data filed at 02/13/15 1300   Gross per 24 hour   Intake    120 ml   Output   2140 ml   Net  -2020 ml     I/O current shift:     Dialysis treatment total fluid removal:  Treatment Total Fluid Removal: 2140 mL  Post dialysis weight:  Weight (Post Dialysis): 92.6 kg (204 lb 2.3 oz)    Labs:    Renal Function    Lab Results   Component Value Date/Time    SODIUM 138 02/13/2015 05:49 AM    POTASSIUM 3.9 02/13/2015 05:49 AM    CHLORIDE 103 02/13/2015 05:49 AM    CO2 TOTAL 20* 02/13/2015 05:49 AM    ANION GAP 15* 02/13/2015 05:49 AM    Lab Results   Component Value Date/Time    CREATININE 4.06* 02/13/2015 05:49 AM    GLUCOSE Negative 02/10/2015 07:36 PM    CALCIUM 8.9 02/13/2015 05:49 AM    PHOSPHORUS 4.8* 02/13/2015 05:49 AM          Basic Metabolic Profile    Lab Results   Component Value Date/Time    SODIUM 138 02/13/2015 05:49 AM    POTASSIUM 3.9 02/13/2015 05:49 AM    CHLORIDE 103 02/13/2015 05:49 AM    CO2 TOTAL 20* 02/13/2015 05:49 AM    ANION GAP 15* 02/13/2015 05:49 AM    Lab Results   Component Value Date/Time    BUN 11 02/13/2015 05:49 AM    CREATININE 4.06* 02/13/2015 05:49 AM    GLUCOSE Negative 02/10/2015 07:36 PM          Hemogram      Lab Results   Component Value Date/Time    WBC 11.9* 02/11/2015 05:53 AM    HGB 9.4* 02/11/2015 05:53 AM    HCT 28.1* 02/11/2015 05:53 AM    PLATELETS 302 02/11/2015 05:53 AM    RBC 3.73* 02/11/2015 05:53 AM    MCV 75.3* 02/11/2015 05:53 AM    MCHC 33.5 02/11/2015 05:53 AM    MCH 25.2* 02/11/2015 05:53 AM    RDW 16.2* 02/11/2015 05:53 AM    MPV 7.9 02/11/2015 05:53 AM          Hepatic Function    Lab Results   Component Value Date/Time    ALBUMIN 3.2* 02/13/2015 05:49 AM    PROTEIN TOTAL 5.3* 02/12/2015 06:33 AM    ALKALINE PHOSPHATASE 103 02/11/2015 05:53 AM    Lab Results   Component Value Date/Time    AST (SGOT) 21 02/11/2015 05:53 AM    ALT (SGPT) 17 02/11/2015 05:53  AM    BILIRUBIN,CONJUGATED 0.2 10/06/2013 12:15 PM          Radiology:  N/A      Current Medications:    Current Facility-Administered Medications:  acetaminophen (TYLENOL) tablet 650 mg Oral Q6H PRN   aspirin chewable tablet 81 mg 81 mg Oral Daily   calcium acetate (PHOSLO) capsule 1,334 mg Oral 3x/day-Meals   clopidogrel (PLAVIX) 75 mg tablet 75 mg Oral Daily   heparin 5,000 unit/mL injection 5,000 Units Subcutaneous Q8HRS   [START ON 02/14/2015] labetalol (NORMODYNE) tablet 200 mg Oral Q12H   LORazepam (ATIVAN) 2 mg/mL injection 0.5 mg Intravenous Q4H PRN   nitroGLYCERIN (NITROSTAT) sublingual tablet 0.4 mg Sublingual Q5 Min PRN   NS bolus infusion 1,000 mL 1,000 mL Intravenous Once   NS flush syringe 10 mL Intravenous Q8HRS   NS flush syringe 10 mL Intravenous Q1H PRN   rosuvastatin (CRESTOR) tablet 10 mg Oral QPM     Allergies   Allergen Reactions    Capoten [Captopril]     Demerol [Meperidine]     Dilaudid [Hydromorphone]     Tramadol Diarrhea         Assessment: Chem improved post HD.  Oliguric.  BP improving.           Active Hospital Problems    Diagnosis    Primary Problem: Acute renal failure    Hypomagnesemia    Leukocytosis    Diarrhea    Hypokalemia    HTN (hypertension)    Coronary artery disease         Plan: Monitor  renal function and UO.  HD Mon as needed.  Inc. Labetalol to 200mg s q 12hrs.

## 2015-02-13 NOTE — Nurses Notes (Signed)
Pleasant affect, no c/o of pain. Voiding without difficultly.will monitor.

## 2015-02-13 NOTE — Progress Notes (Signed)
[facility]    IP PROGRESS NOTE      [place]  Date of Admission:  02/10/2015  Date of Birth:  14-Aug-1950  Date of Service:  02/13/2015    Chief Complaint: Patient with ARF, new onset. Her PCP referred her to ED.       Subjective: ROS: Patient with no new complaints today. She feels tired. Denies any CP or SOB.     Vital Signs:  Temp (24hrs) Max:37.1 C (98.8 F)      Temperature: 36.3 C (97.3 F)  BP (Non-Invasive): (!) 154/89 mmHg  MAP (Non-Invasive): 91 mmHG  Heart Rate: (!) 115  Respiratory Rate: 20  Pain Score (Numeric, Faces): 0  SpO2-1: 100 %    Current Medications:    Current Facility-Administered Medications:  acetaminophen (TYLENOL) tablet 650 mg Oral Q6H PRN   aspirin chewable tablet 81 mg 81 mg Oral Daily   calcium acetate (PHOSLO) capsule 1,334 mg Oral 3x/day-Meals   clopidogrel (PLAVIX) 75 mg tablet 75 mg Oral Daily   heparin 5,000 unit/mL injection 5,000 Units Subcutaneous Q8HRS   labetalol (NORMODYNE) tablet 100 mg Oral Q8HRS   LORazepam (ATIVAN) 2 mg/mL injection 0.5 mg Intravenous Q4H PRN   nitroGLYCERIN (NITROSTAT) sublingual tablet 0.4 mg Sublingual Q5 Min PRN   NS bolus infusion 1,000 mL 1,000 mL Intravenous Once   NS flush syringe 10 mL Intravenous Q8HRS   NS flush syringe 10 mL Intravenous Q1H PRN   rosuvastatin (CRESTOR) tablet 10 mg Oral QPM       Today's Physical Exam:  GENERAL: Patient is alert, awake and oriented X 3. In no cardio respiratory distress.   HEENT: PERRLA, EOMI.   NECK: Supple, NO JVD.   HEART: S1+, S2+, rate controlled and rhythm regular. No murmurs appreciated.   LUNGS: Bilateral breath sounds fair, no crackles or wheezing.   ABDOMEN: Soft, NT, ND with NABS.   EXTREMITIES: No edema, pedal pulses palpable.   NEURO:  Motor exam is non focal.   SKIN: No rashes or bruises.   PSYCH: Pleasant with no signs of depression.        I/O:  I/O last 24 hours:      Intake/Output Summary (Last 24 hours) at 02/13/15 1514  Last data filed at 02/13/15 1300   Gross per 24  hour   Intake    120 ml   Output   2140 ml   Net  -2020 ml     I/O current shift:  08/20 0800 - 08/20 1559  In: 120 [P.O.:120]  Out: -     Nutrition/Residuals:  DIET RENAL (75GM PROT;2GM K: 2GM NA)    Labs -   reviewed  Reviewed:   Lab Results for Last 24 Hours:    Results for orders placed or performed during the hospital encounter of 02/10/15 (from the past 24 hour(s))   RENAL FUNCTION PANEL   Result Value Ref Range    SODIUM 138 136-145 mmol/L    POTASSIUM 3.9 3.4-5.1 mmol/L    CHLORIDE 103 101-111 mmol/L    CO2 TOTAL 20 (L) 22-32 mmol/L    ANION GAP 15 (H) 3-11 mmol/L    BUN 11 6-20 mg/dL    CREATININE 5.40 (H) 0.44-1.00 mg/dL    BUN/CREA RATIO 3 (L) 6-22    ESTIMATED GFR 13 (L) >60 mL/min/1.48m2    CALCIUM 8.9 8.8-10.2 mg/dL    GLUCOSE 92 98-119 mg/dL    PHOSPHORUS 4.8 (H) 1.4-7.8 mg/dL    ALBUMIN 3.2 (  L) 3.5-5.0 g/dL     Ordered:        Diagnostic Tests - reviewed      Radiology Tests - reviewed        Problem List:  Active Hospital Problems   (*Primary Problem)    Diagnosis    *Acute renal failure    Hypomagnesemia    Leukocytosis    Diarrhea    Hypokalemia    HTN (hypertension)    Coronary artery disease     H/O PCI with stent to mid LAD         Assessment/ Plan:     Acute Renal Failure - suspected to be due to acute interstitial nephritis. Due to Pyuria - acute interstitial nephritis is suspected by Dr. Webb Silversmith. Nephrology consult appreciated. Creatinine improving some she has some urine output. HD, started yesterday after Trialysis catheter was placed.     Hypertensive Urgency - patient with high BP prior to HD - Dr. Webb Silversmith / Nephrology managing. BP lower after HD - off of IV fluids since yesterday. She is on Labetalol per Nephrology.     Hypokalemia and hypomagnesemia - replace as needed.     ? UTI - on empiric Antibiotics - will stop as urine culture is showing < 10,000 organisms. She has H/O Sterile Pyuria last year/     B 12  Deficiency - replaced. Added PO supplements.     H/O CAD - continue home  meds. Hold Cozaar.     Obesity - encourage weight loss.     PLAN:    Follow serial labs.     Avoid nephrotoxic drugs.     DVT/PE Prophylaxis: Heparin    She has been off of Protonix.     CM to see - in case she will need HD set up as outpatient - until her renal function improves.

## 2015-02-14 LAB — RENAL FUNCTION PANEL
ALBUMIN: 3 g/dL — ABNORMAL LOW (ref 3.5–5.0)
ANION GAP: 8 mmol/L (ref 3–11)
BUN/CREA RATIO: 4 — ABNORMAL LOW (ref 6–22)
BUN: 19 mg/dL (ref 6–20)
CALCIUM: 8.5 mg/dL — ABNORMAL LOW (ref 8.8–10.2)
CHLORIDE: 99 mmol/L — ABNORMAL LOW (ref 101–111)
CHLORIDE: 99 mmol/L — ABNORMAL LOW (ref 101–111)
CO2 TOTAL: 26 mmol/L (ref 22–32)
CREATININE: 5.18 mg/dL — ABNORMAL HIGH (ref 0.44–1.00)
ESTIMATED GFR: 10 mL/min/1.73mˆ2 — ABNORMAL LOW (ref >60)
GLUCOSE: 113 mg/dL — ABNORMAL HIGH (ref 70–110)
PHOSPHORUS: 4.2 mg/dL (ref 2.7–4.5)
POTASSIUM: 3.6 mmol/L (ref 3.4–5.1)
SODIUM: 133 mmol/L — ABNORMAL LOW (ref 136–145)

## 2015-02-14 NOTE — Nurses Notes (Signed)
Pt is aox3.  Pt has denied any new c/o at this time.  Pt is resting in bed at this time.  Will continue to monitor.

## 2015-02-14 NOTE — Progress Notes (Signed)
[facility]    IP PROGRESS NOTE      [place]  Date of Admission:  02/10/2015  Date of Birth:  20-Aug-1950  Date of Service:  02/14/2015    Chief Complaint: Patient with ARF, new onset. Her PCP referred her to ED.       Subjective: ROS: Patient with no new complaints today. Denies any CP or SOB.     Vital Signs:  Temp (24hrs) Max:36.9 C (98.5 F)      Temperature: 36.8 C (98.2 F)  BP (Non-Invasive): (!) 147/87 mmHg  MAP (Non-Invasive): 100 mmHG  Heart Rate: (!) 106  Respiratory Rate: 18  Pain Score (Numeric, Faces): 0  SpO2-1: 99 %    Current Medications:    Current Facility-Administered Medications:  acetaminophen (TYLENOL) tablet 650 mg Oral Q6H PRN   aspirin chewable tablet 81 mg 81 mg Oral Daily   calcium acetate (PHOSLO) capsule 1,334 mg Oral 3x/day-Meals   clopidogrel (PLAVIX) 75 mg tablet 75 mg Oral Daily   heparin 5,000 unit/mL injection 5,000 Units Subcutaneous Q8HRS   labetalol (NORMODYNE) tablet 200 mg Oral Q12H   LORazepam (ATIVAN) 2 mg/mL injection 0.5 mg Intravenous Q4H PRN   nitroGLYCERIN (NITROSTAT) sublingual tablet 0.4 mg Sublingual Q5 Min PRN   NS bolus infusion 1,000 mL 1,000 mL Intravenous Once   NS flush syringe 10 mL Intravenous Q8HRS   NS flush syringe 10 mL Intravenous Q1H PRN   rosuvastatin (CRESTOR) tablet 10 mg Oral QPM       Today's Physical Exam:  GENERAL: Patient is alert, awake and oriented X 3. In no cardio respiratory distress.   HEENT: PERRLA, EOMI.   NECK: Supple, NO JVD.   HEART: S1+, S2+, rate controlled and rhythm regular. No murmurs appreciated.   LUNGS: Bilateral breath sounds fair, no crackles or wheezing.   ABDOMEN: Soft, NT, ND with NABS.   EXTREMITIES: No edema, pedal pulses palpable.   NEURO:  Motor exam is non focal.   SKIN: No rashes or bruises.   PSYCH: Pleasant with no signs of depression.        I/O:  I/O last 24 hours:      Intake/Output Summary (Last 24 hours) at 02/14/15 1536  Last data filed at 02/14/15 1300   Gross per 24 hour   Intake     720 ml   Output      0 ml   Net    720 ml     I/O current shift:  08/21 0800 - 08/21 1559  In: 360 [P.O.:360]  Out: -     Nutrition/Residuals:  DIET RENAL (75GM PROT;2GM K: 2GM NA)    Labs -   reviewed  Reviewed:   Lab Results for Last 24 Hours:    Results for orders placed or performed during the hospital encounter of 02/10/15 (from the past 24 hour(s))   RENAL FUNCTION PANEL   Result Value Ref Range    SODIUM 133 (L) 136-145 mmol/L    POTASSIUM 3.6 3.4-5.1 mmol/L    CHLORIDE 99 (L) 101-111 mmol/L    CO2 TOTAL 26 22-32 mmol/L    ANION GAP 8 3-11 mmol/L    BUN 19 6-20 mg/dL    CREATININE 8.65 (H) 0.44-1.00 mg/dL    BUN/CREA RATIO 4 (L) 6-22    ESTIMATED GFR 10 (L) >60 mL/min/1.21m2    CALCIUM 8.5 (L) 8.8-10.2 mg/dL    GLUCOSE 784 (H) 69-629 mg/dL    PHOSPHORUS 4.2 5.2-8.4 mg/dL  ALBUMIN 3.0 (L) 3.5-5.0 g/dL     Ordered:        Diagnostic Tests - reviewed      Radiology Tests - reviewed        Problem List:  Active Hospital Problems   (*Primary Problem)    Diagnosis    *Acute renal failure    Hypomagnesemia    Leukocytosis    Diarrhea    Hypokalemia    HTN (hypertension)    Coronary artery disease     H/O PCI with stent to mid LAD         Assessment/ Plan:     Acute Renal Failure - suspected to be due to acute interstitial nephritis. Due to Pyuria - acute interstitial nephritis is suspected by Dr. Webb Silversmith. Nephrology consult appreciated. Creatinine improving some she has some urine output. HD, started yesterday after Trialysis catheter was placed.     Hypertensive Urgency - patient with high BP prior to HD - Dr. Webb Silversmith / Nephrology managing. BP lower after HD - but is still higher - Labetalol increased by Dr. Helaine Chess, Nephrology to 200 mg PO BID.     Hypokalemia and hypomagnesemia - replace as needed.     ? UTI - on empiric Antibiotics - will stop as urine culture is showing < 10,000 organisms. She has H/O Sterile Pyuria last year    B 12  Deficiency - replaced. Added PO supplements.     H/O CAD - continue home  meds. Hold Cozaar.     Obesity - encourage weight loss.     PLAN:    Follow serial labs.     Avoid nephrotoxic drugs.     DVT/PE Prophylaxis: Heparin    She has been off of Protonix as this can cause interstitial nephritis.     CM to see - in case she will need HD set up as outpatient - until her renal function improves.

## 2015-02-15 LAB — CBC WITH DIFF
HCT: 31.5 % — ABNORMAL LOW (ref 36.0–45.0)
HGB: 10.2 g/dL — ABNORMAL LOW (ref 12.0–15.5)
MCH: 24.6 pg — ABNORMAL LOW (ref 27.5–33.2)
MCHC: 32.4 g/dL (ref 32.0–36.0)
MCV: 75.7 fL — ABNORMAL LOW (ref 82.0–97.0)
MPV: 6.9 fL — ABNORMAL LOW (ref 7.4–10.5)
PLATELETS: 313 10*3/uL (ref 150–450)
PLATELETS: 313 x10ˆ3/uL (ref 150–450)
RBC: 4.16 x10ˆ6/uL (ref 4.00–5.10)
RDW: 16.8 % — ABNORMAL HIGH (ref 11.0–16.0)
WBC: 12.2 x10ˆ3/uL — ABNORMAL HIGH (ref 4.0–11.0)

## 2015-02-15 LAB — MANUAL DIFFERENTIAL
BAND %: 1 % (ref 0–4)
BASOPHIL %: 1 % (ref 0–3)
BASOPHIL ABSOLUTE: 0.12 x10ˆ3/uL
EOSINOPHIL %: 5 % (ref 0–5)
EOSINOPHIL ABSOLUTE: 0.61 x10ˆ3/uL
LYMPHOCYTE %: 21 % (ref 15–43)
LYMPHOCYTE ABSOLUTE: 2.56 x10ˆ3/uL
METAMYELOCYTE %: 1 % (ref 0–1)
MONOCYTE %: 9 % (ref 5–12)
MONOCYTE ABSOLUTE: 1.1 x10ˆ3/uL
NEUTROPHIL %: 62 % (ref 43–76)
NEUTROPHIL ABSOLUTE: 7.69 x10ˆ3/uL
PLATELET ESTIMATE: ADEQUATE
RBC MORPHOLOGY COMMENT: NORMAL
WBC MORPHOLOGY COMMENT: NORMAL
WBC: 12.2 x10ˆ3/uL

## 2015-02-15 LAB — RENAL FUNCTION PANEL
ALBUMIN: 3.1 g/dL — ABNORMAL LOW (ref 3.5–5.0)
ANION GAP: 13 mmol/L — ABNORMAL HIGH (ref 3–11)
BUN/CREA RATIO: 4 — ABNORMAL LOW (ref 6–22)
BUN: 21 mg/dL — ABNORMAL HIGH (ref 6–20)
CALCIUM: 8.6 mg/dL — ABNORMAL LOW (ref 8.8–10.2)
CHLORIDE: 98 mmol/L — ABNORMAL LOW (ref 101–111)
CO2 TOTAL: 23 mmol/L (ref 22–32)
CREATININE: 5.24 mg/dL — ABNORMAL HIGH (ref 0.44–1.00)
ESTIMATED GFR: 10 mL/min/1.73m?2 — ABNORMAL LOW (ref >60)
GLUCOSE: 90 mg/dL (ref 70–110)
PHOSPHORUS: 5.2 mg/dL — ABNORMAL HIGH (ref 2.7–4.5)
POTASSIUM: 3.8 mmol/L (ref 3.4–5.1)
SODIUM: 134 mmol/L — ABNORMAL LOW (ref 136–145)

## 2015-02-15 LAB — ANA (ANTINUCLEAR ANTIBODIES), SERUM
ANTI-NUCLEAR ANTIBODIES,QUALITATIVE: NEGATIVE
ANTI-NUCLEAR ANTIBODIES,QUANTITATIVE: 0.18 {index_val} (ref <=0.90)

## 2015-02-15 LAB — PROTEIN ELECTROPHORESIS, SERUM (SPEP): PATHOLOGIST INTERPRETATION: ABNORMAL — AB

## 2015-02-15 LAB — ADULT ROUTINE BLOOD CULTURE, SET OF 2 BOTTLES (BACTERIA AND YEAST)
BLOOD CULTURE, ROUTINE: NO GROWTH
BLOOD CULTURE, ROUTINE: NO GROWTH

## 2015-02-15 LAB — MAGNESIUM: MAGNESIUM: 1.5 mg/dL (ref 1.4–2.1)

## 2015-02-15 LAB — C3 COMPLEMENT, SERUM: C3 COMPLEMENT: 176 mg/dL — ABNORMAL HIGH (ref 81–157)

## 2015-02-15 LAB — C4 COMPLEMENT, SERUM: C4 COMPLEMENT: 45 mg/dL — ABNORMAL HIGH (ref 12–39)

## 2015-02-15 MED ORDER — MAGNESIUM OXIDE 400 MG (241.3 MG MAGNESIUM) TABLET
400.0000 mg | ORAL_TABLET | Freq: Every day | ORAL | Status: DC
Start: 2015-02-15 — End: 2015-02-18
  Administered 2015-02-15 – 2015-02-18 (×4): 400 mg via ORAL
  Filled 2015-02-15 (×4): qty 1

## 2015-02-15 MED ADMIN — sodium chloride 0.9 % (flush) injection syringe: ORAL | @ 21:00:00

## 2015-02-15 MED ADMIN — bupivacaine-epinephrine (PF) 0.5 %-1:200,000 injection solution: ORAL | @ 11:00:00

## 2015-02-15 NOTE — Nurses Notes (Signed)
Pt is aox3.  Pt is resting at this time.  Pt has denied any c/o.  Will continue to monitor.

## 2015-02-15 NOTE — Progress Notes (Signed)
[facility]    IP PROGRESS NOTE      [place]  Date of Admission:  02/10/2015  Date of Birth:  18-Jul-1950  Date of Service:  02/15/2015    Chief Complaint: Patient with ARF, new onset. Her PCP referred her to ED.       Subjective: ROS: Patient with no new complaints today. Denies any CP or SOB. Her BP is better controlled. She is urinating some.     Vital Signs:  Temp (24hrs) Max:37.1 C (98.7 F)      Temperature: 36.9 C (98.4 F)  BP (Non-Invasive): (!) 144/86 mmHg  MAP (Non-Invasive): 96 mmHG  Heart Rate: (!) 111  Respiratory Rate: 16  Pain Score (Numeric, Faces): 0  SpO2-1: 96 %    Current Medications:    Current Facility-Administered Medications:  acetaminophen (TYLENOL) tablet 650 mg Oral Q6H PRN   aspirin chewable tablet 81 mg 81 mg Oral Daily   calcium acetate (PHOSLO) capsule 1,334 mg Oral 3x/day-Meals   clopidogrel (PLAVIX) 75 mg tablet 75 mg Oral Daily   heparin 5,000 unit/mL injection 5,000 Units Subcutaneous Q8HRS   labetalol (NORMODYNE) tablet 200 mg Oral Q12H   LORazepam (ATIVAN) 2 mg/mL injection 0.5 mg Intravenous Q4H PRN   nitroGLYCERIN (NITROSTAT) sublingual tablet 0.4 mg Sublingual Q5 Min PRN   NS bolus infusion 1,000 mL 1,000 mL Intravenous Once   NS flush syringe 10 mL Intravenous Q8HRS   NS flush syringe 10 mL Intravenous Q1H PRN   rosuvastatin (CRESTOR) tablet 10 mg Oral QPM       Today's Physical Exam:  GENERAL: Patient is alert, awake and oriented X 3. In no cardio respiratory distress.   HEENT: PERRLA, EOMI.   NECK: Supple, NO JVD. Trialysis catheter noted in the neck.   HEART: S1+, S2+, rate controlled and rhythm regular. No murmurs appreciated.   LUNGS: Bilateral breath sounds fair, no crackles or wheezing.   ABDOMEN: Soft, NT, ND with NABS.   EXTREMITIES: No edema, pedal pulses palpable.   NEURO:  Motor exam is non focal.   SKIN: No rashes or bruises.   PSYCH: Pleasant with no signs of depression.        I/O:  I/O last 24 hours:      Intake/Output Summary (Last 24  hours) at 02/15/15 1130  Last data filed at 02/14/15 1300   Gross per 24 hour   Intake    120 ml   Output      0 ml   Net    120 ml     I/O current shift:       Nutrition/Residuals:  DIET RENAL (75GM PROT;2GM K: 2GM NA)    Labs -   reviewed  Reviewed:   Lab Results for Last 24 Hours:    Results for orders placed or performed during the hospital encounter of 02/10/15 (from the past 24 hour(s))   RENAL FUNCTION PANEL   Result Value Ref Range    SODIUM 134 (L) 136-145 mmol/L    POTASSIUM 3.8 3.4-5.1 mmol/L    CHLORIDE 98 (L) 101-111 mmol/L    CO2 TOTAL 23 22-32 mmol/L    ANION GAP 13 (H) 3-11 mmol/L    BUN 21 (H) 6-20 mg/dL    CREATININE 5.40 (H) 0.44-1.00 mg/dL    BUN/CREA RATIO 4 (L) 6-22    ESTIMATED GFR 10 (L) >60 mL/min/1.20m2    CALCIUM 8.6 (L) 8.8-10.2 mg/dL    GLUCOSE 90 98-119 mg/dL    PHOSPHORUS  5.2 (H) 2.7-4.5 mg/dL    ALBUMIN 3.1 (L) 1.6-1.0 g/dL   CBC WITH DIFF   Result Value Ref Range    WBC 12.2 (H) 4.0-11.0 x103/uL    RBC 4.16 4.00-5.10 x106/uL    HGB 10.2 (L) 12.0-15.5 g/dL    HCT 96.0 (L) 45.4-09.8 %    MCV 75.7 (L) 82.0-97.0 fL    MCH 24.6 (L) 27.5-33.2 pg    MCHC 32.4 32.0-36.0 g/dL    RDW 11.9 (H) 14.7-82.9 %    PLATELETS 313 150-450 x103/uL    MPV 6.9 (L) 7.4-10.5 fL   MANUAL DIFFERENTIAL   Result Value Ref Range    NEUTROPHIL % 62 43-76 %    LYMPHOCYTE % 21 15-43 %    MONOCYTE % 9 5-12 %    EOSINOPHIL % 5 0-5 %    BASOPHIL % 1 0-3 %    BAND % 1 0-4 %    METAMYELOCYTE % 1 0-1 %    NEUTROPHIL ABSOLUTE 7.69 x103/uL    LYMPHOCYTE ABSOLUTE 2.56 x103/uL    MONOCYTE ABSOLUTE 1.10 x103/uL    EOSINOPHIL ABSOLUTE 0.61 x103/uL    BASOPHIL ABSOLUTE 0.12 x103/uL    PLATELET ESTIMATE Adequate     RBC MORPHOLOGY COMMENT Normal     WBC MORPHOLOGY COMMENT Normal     WBC 12.2 x103/uL     Ordered:        Diagnostic Tests - reviewed      Radiology Tests - reviewed        Problem List:  Active Hospital Problems   (*Primary Problem)    Diagnosis    *Acute renal failure    Hypomagnesemia    Leukocytosis       Diarrhea    Hypokalemia    HTN (hypertension)    Coronary artery disease     H/O PCI with stent to mid LAD         Assessment/ Plan:     Acute Renal Failure - suspected to be due to acute interstitial nephritis. Dr. Webb Silversmith. Nephrology consult appreciated. Creatinine improving some she has some urine output. HD was started last week - no need for HD today, per Dr. Webb Silversmith. Trialysis catheter was placed on 02/12/15.     Hypertensive Urgency - patient with high BP prior to HD - Dr. Webb Silversmith / Nephrology managing. BP lower after HD - better controlled on Labetalol increased at 200 mg PO BID.  Dr. Webb Silversmith following.     Hypokalemia and hypomagnesemia - replace as needed. Magnesium is 1.5 -     ? UTI - was given empiric Antibiotics - this could be sterile pyuria. She has H/O Sterile Pyuria last year    B 12  Deficiency - replaced. Added PO supplements.     H/O CAD - continue ASA and statin. Hold Cozaar.     Obesity with BMI of 35 kg/ m2 - encourage weight loss.     PLAN:    Follow serial labs.     Avoid nephrotoxic drugs.     DVT/PE Prophylaxis: Heparin    She has been off of Protonix as this can cause interstitial nephritis. No NSAIDs PO or as Topical agents.     CM to see - in case she will need HD set up as outpatient - until her renal function improves.

## 2015-02-15 NOTE — Progress Notes (Signed)
[name]    IP PROGRESS NOTE      [place]  Date of Admission:  02/10/2015  Date of Birth:  [birth year]  Date of Service:  02/15/2015    Chief Complaint: Patient with ARF, new onset. Her PCP referred her to ED.       Subjective: ROS: Patient with no new complaints today. Denies any CP or SOB. Her BP is better controlled. She is urinating some.     Vital Signs:  Temp (24hrs) Max:37.1 C (98.7 F)      Temperature: 36.9 C (98.4 F)  BP (Non-Invasive): (!) 146/91 mmHg  MAP (Non-Invasive): 96 mmHG  Heart Rate: (!) 114  Respiratory Rate: 16  Pain Score (Numeric, Faces): 0  SpO2-1: 100 %    Current Medications:    Current Facility-Administered Medications:  acetaminophen (TYLENOL) tablet 650 mg Oral Q6H PRN   aspirin chewable tablet 81 mg 81 mg Oral Daily   calcium acetate (PHOSLO) capsule 1,334 mg Oral 3x/day-Meals   clopidogrel (PLAVIX) 75 mg tablet 75 mg Oral Daily   heparin 5,000 unit/mL injection 5,000 Units Subcutaneous Q8HRS   labetalol (NORMODYNE) tablet 200 mg Oral Q12H   LORazepam (ATIVAN) 2 mg/mL injection 0.5 mg Intravenous Q4H PRN   magnesium oxide (MAG-OX) tablet 400 mg Oral Daily   nitroGLYCERIN (NITROSTAT) sublingual tablet 0.4 mg Sublingual Q5 Min PRN   NS bolus infusion 1,000 mL 1,000 mL Intravenous Once   NS flush syringe 10 mL Intravenous Q8HRS   NS flush syringe 10 mL Intravenous Q1H PRN   rosuvastatin (CRESTOR) tablet 10 mg Oral QPM       Today's Physical Exam:  GENERAL: Patient is alert, awake and oriented X 3. In no cardio respiratory distress.   HEENT: PERRLA, EOMI.   NECK: Supple, NO JVD. Trialysis catheter noted in the neck.   HEART: S1+, S2+, rate controlled and rhythm regular. No murmurs appreciated.   LUNGS: Bilateral breath sounds fair, no crackles or wheezing.   ABDOMEN: Soft, NT, ND with NABS.   EXTREMITIES: No edema, pedal pulses palpable.   NEURO:  Motor exam is non focal.   SKIN: No rashes or bruises.   PSYCH: Pleasant with no signs of depression.        I/O:  I/O  last 24 hours:    No intake or output data in the 24 hours ending 02/15/15 1331  I/O current shift:       Nutrition/Residuals:  DIET RENAL (75GM PROT;2GM K: 2GM NA)    Labs -   reviewed  Reviewed:   Lab Results for Last 24 Hours:    Results for orders placed or performed during the hospital encounter of 02/10/15 (from the past 24 hour(s))   RENAL FUNCTION PANEL   Result Value Ref Range    SODIUM 134 (L) 136-145 mmol/L    POTASSIUM 3.8 3.4-5.1 mmol/L    CHLORIDE 98 (L) 101-111 mmol/L    CO2 TOTAL 23 22-32 mmol/L    ANION GAP 13 (H) 3-11 mmol/L    BUN 21 (H) 6-20 mg/dL    CREATININE 1.61 (H) 0.44-1.00 mg/dL    BUN/CREA RATIO 4 (L) 6-22    ESTIMATED GFR 10 (L) >60 mL/min/1.20m2    CALCIUM 8.6 (L) 8.8-10.2 mg/dL    GLUCOSE 90 09-604 mg/dL    PHOSPHORUS 5.2 (H) 5.4-0.9 mg/dL    ALBUMIN 3.1 (L) 8.1-1.9 g/dL   CBC WITH DIFF   Result Value Ref Range    WBC 12.2 (H) 4.0-11.0  x103/uL    RBC 4.16 4.00-5.10 x106/uL    HGB 10.2 (L) 12.0-15.5 g/dL    HCT 16.1 (L) 09.6-04.5 %    MCV 75.7 (L) 82.0-97.0 fL    MCH 24.6 (L) 27.5-33.2 pg    MCHC 32.4 32.0-36.0 g/dL    RDW 40.9 (H) 81.1-91.4 %    PLATELETS 313 150-450 x103/uL    MPV 6.9 (L) 7.4-10.5 fL   MANUAL DIFFERENTIAL   Result Value Ref Range    NEUTROPHIL % 62 43-76 %    LYMPHOCYTE % 21 15-43 %    MONOCYTE % 9 5-12 %    EOSINOPHIL % 5 0-5 %    BASOPHIL % 1 0-3 %    BAND % 1 0-4 %    METAMYELOCYTE % 1 0-1 %    NEUTROPHIL ABSOLUTE 7.69 x103/uL    LYMPHOCYTE ABSOLUTE 2.56 x103/uL    MONOCYTE ABSOLUTE 1.10 x103/uL    EOSINOPHIL ABSOLUTE 0.61 x103/uL    BASOPHIL ABSOLUTE 0.12 x103/uL    PLATELET ESTIMATE Adequate     RBC MORPHOLOGY COMMENT Normal     WBC MORPHOLOGY COMMENT Normal     WBC 12.2 x103/uL   MAGNESIUM   Result Value Ref Range    MAGNESIUM 1.5 1.4-2.1 mg/dL     Ordered:        Diagnostic Tests - reviewed      Radiology Tests - reviewed        Problem List:  Active Hospital Problems   (*Primary Problem)    Diagnosis    *Acute renal failure    Hypomagnesemia     Leukocytosis    Diarrhea    Hypokalemia    HTN (hypertension)    Coronary artery disease     H/O PCI with stent to mid LAD         Assessment/ Plan:     Acute Renal Failure - suspected to be due to acute interstitial nephritis. Dr. Webb Silversmith. Nephrology consult appreciated. Creatinine improving some she has some urine output. HD was started last week - no need for HD today, per Dr. Webb Silversmith. Trialysis catheter was placed on 02/12/15.     Hypertensive Urgency - patient with high BP prior to HD - Dr. Webb Silversmith / Nephrology managing. BP lower after HD - better controlled on Labetalol increased at 200 mg PO BID.  Dr. Webb Silversmith following.     Hypokalemia and hypomagnesemia - replace as needed. Magnesium is 1.5 today - being replaced PO.     ? UTI - was given empiric Antibiotics - this could be sterile pyuria. She has H/O Sterile Pyuria last year    B 12  Deficiency - replaced. Added PO supplements.     H/O CAD - continue ASA and statin. Hold Cozaar.     Obesity with BMI of 35 kg/ m2 - encourage weight loss.     PLAN:    Follow serial labs.     Avoid nephrotoxic drugs.     DVT/PE Prophylaxis: Heparin    She has been off of Protonix as this can cause interstitial nephritis. No NSAIDs PO or as Topical agents.     CM to see - in case she will need HD set up as outpatient - until her renal function improves.

## 2015-02-15 NOTE — Care Management Notes (Signed)
SS: I entered the patient's chart to review if there were Advanced Directives on file. The patient does not have a Medical Power of Attorney or Health Care Surrogate listed.

## 2015-02-15 NOTE — Progress Notes (Addendum)
[facility]  Nephrology Consult Follow Up Note      Date of service: 02/15/2015  Hospital Day:  LOS: 5 days     SUBJECTIVE: No uremic symptoms. Making urine, but not being measured.     OBJECTIVE:    Vital Signs:  Temp (24hrs) Max:37.1 C (98.7 F)      Temperature: 36.9 C (98.4 F)  BP (Non-Invasive): (!) 144/86 mmHg  MAP (Non-Invasive): 96 mmHG  Heart Rate: (!) 111  Respiratory Rate: 16  Pain Score (Numeric, Faces): 0  SpO2-1: 96 %    Systolic (24hrs), Avg:133 mmHg, Min:118 mmHg, Max:147 mmHg    Diastolic (24hrs), Avg:81 mmHg, Min:69 mmHg, Max:87 mmHg    MAP:  MAP (Non-Invasive)  Avg: 98.1 mmHG  Min: 80 mmHG  Max: 109 mmHG  Heart Rate:  Pulse  Avg: 109.6  Min: 100  Max: 126    Dialysis treatment total fluid removal:  Treatment Total Fluid Removal: 2140 mL  Post dialysis weight:  Weight (Post Dialysis): 92.6 kg (204 lb 2.3 oz)    Diet Order:  DIET RENAL (75GM PROT;2GM K: 2GM NA)  Nutrition:    Orders Placed This Encounter   No orders of the following type(s) were placed in this encounter: Nourishments.     IV Fluids, Meds, and Drips:      I/O:  I/O last 24 hours:    Intake/Output Summary (Last 24 hours) at 02/15/15 1101  Last data filed at 02/14/15 1300   Gross per 24 hour   Intake    120 ml   Output      0 ml   Net    120 ml     I/O current shift:     I/O last 3 completed shifts:  In: 360 [P.O.:360]  Out: -     Labs:  I have reviewed all lab results.    Imaging:  N/A    Current Medications:    Current Facility-Administered Medications:  acetaminophen (TYLENOL) tablet 650 mg Oral Q6H PRN   aspirin chewable tablet 81 mg 81 mg Oral Daily   calcium acetate (PHOSLO) capsule 1,334 mg Oral 3x/day-Meals   clopidogrel (PLAVIX) 75 mg tablet 75 mg Oral Daily   heparin 5,000 unit/mL injection 5,000 Units Subcutaneous Q8HRS   labetalol (NORMODYNE) tablet 200 mg Oral Q12H   LORazepam (ATIVAN) 2 mg/mL injection 0.5 mg Intravenous Q4H PRN   nitroGLYCERIN (NITROSTAT) sublingual tablet 0.4 mg Sublingual Q5 Min PRN   NS  bolus infusion 1,000 mL 1,000 mL Intravenous Once   NS flush syringe 10 mL Intravenous Q8HRS   NS flush syringe 10 mL Intravenous Q1H PRN   rosuvastatin (CRESTOR) tablet 10 mg Oral QPM       Physical Exam:  General: no distress  Lungs: Clear to auscultation bilaterally.   Cardiovascular: regular rate and rhythm  no rub  Extremities: No cyanosis or edema  Skin: Skin warm and dry  Neurologic: No asterixis    Impression/Recommendations:  1. ARF due to AIN. Showing signs of improved GFR with stabilization of Scr. No HD today. Labs in morning. Cannot take PO or transdermal NSAIDs in future.   2. HTN - controlled on Labetalol  3. 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 Continue low phosphorus diet and binder.

## 2015-02-16 LAB — URINALYSIS, MACROSCOPIC
BILIRUBIN: NEGATIVE mg/dL
BLOOD: NEGATIVE mg/dL
GLUCOSE: NEGATIVE mg/dL
KETONES: NEGATIVE mg/dL
NITRITE: NEGATIVE
PH: 5 (ref <8.0)
PROTEIN: NEGATIVE mg/dL
SPECIFIC GRAVITY: 1.005 (ref <1.022)
UROBILINOGEN: <2 mg/dL (ref <=2.0)

## 2015-02-16 LAB — MANUAL DIFFERENTIAL
EOSINOPHIL %: 4 % (ref 0–5)
EOSINOPHIL ABSOLUTE: 0.44 x10ˆ3/uL
LYMPHOCYTE %: 12 % — ABNORMAL LOW (ref 15–43)
LYMPHOCYTE ABSOLUTE: 1.32 x10ˆ3/uL
MONOCYTE %: 7 % (ref 5–12)
MONOCYTE ABSOLUTE: 0.77 x10ˆ3/uL
NEUTROPHIL %: 77 % — ABNORMAL HIGH (ref 43–76)
NEUTROPHIL ABSOLUTE: 8.47 x10ˆ3/uL
PLATELET ESTIMATE: ADEQUATE
WBC MORPHOLOGY COMMENT: NORMAL
WBC: 11 x10ˆ3/uL

## 2015-02-16 LAB — CYTOPLASMIC NEUTROPHIL ANTIBODIES, SERUM
C-ANCA: NEGATIVE
P-ANCA: POSITIVE — AB

## 2015-02-16 LAB — COMPREHENSIVE METABOLIC PANEL, NON-FASTING
ALBUMIN/GLOBULIN RATIO: 1.3 (ref 0.8–2.0)
ALBUMIN: 3.2 g/dL — ABNORMAL LOW (ref 3.5–5.0)
ALKALINE PHOSPHATASE: 84 U/L (ref 38–126)
ALT (SGPT): 22 U/L (ref 14–54)
ANION GAP: 11 mmol/L (ref 3–11)
AST (SGOT): 26 U/L (ref 15–41)
BILIRUBIN TOTAL: 0.5 mg/dL (ref 0.3–1.2)
BUN/CREA RATIO: 5 — ABNORMAL LOW (ref 6–22)
BUN: 24 mg/dL — ABNORMAL HIGH (ref 6–20)
CALCIUM: 8.6 mg/dL — ABNORMAL LOW (ref 8.8–10.2)
CHLORIDE: 102 mmol/L (ref 101–111)
CO2 TOTAL: 26 mmol/L (ref 22–32)
CREATININE: 5.21 mg/dL — ABNORMAL HIGH (ref 0.44–1.00)
ESTIMATED GFR: 10 mL/min/1.73mˆ2 — ABNORMAL LOW (ref >60)
GLUCOSE: 104 mg/dL (ref 70–110)
POTASSIUM: 3.9 mmol/L (ref 3.4–5.1)
PROTEIN TOTAL: 5.7 g/dL — ABNORMAL LOW (ref 6.4–8.3)
SODIUM: 139 mmol/L (ref 136–145)

## 2015-02-16 LAB — ECG 12-LEAD
Atrial Rate: 97 {beats}/min
Calculated P Axis: 32 degrees
Calculated R Axis: -13 degrees
Calculated T Axis: 45 degrees
PR Interval: 176 ms
QRS Duration: 90 ms
QT Interval: 410 ms
QTC Calculation: 520 ms
Ventricular rate: 97 {beats}/min

## 2015-02-16 LAB — URINALYSIS, MICROSCOPIC

## 2015-02-16 LAB — CBC WITH DIFF
HCT: 29.9 % — ABNORMAL LOW (ref 36.0–45.0)
HGB: 10 g/dL — ABNORMAL LOW (ref 12.0–15.5)
MCH: 25.2 pg — ABNORMAL LOW (ref 27.5–33.2)
MCHC: 33.4 g/dL (ref 32.0–36.0)
MCV: 75.4 fL — ABNORMAL LOW (ref 82.0–97.0)
MPV: 7.5 fL (ref 7.4–10.5)
PLATELETS: 286 x10ˆ3/uL (ref 150–450)
RBC: 3.96 x10ˆ6/uL — ABNORMAL LOW (ref 4.00–5.10)
RDW: 16.5 % — ABNORMAL HIGH (ref 11.0–16.0)
WBC: 11 x10ˆ3/uL (ref 4.0–11.0)

## 2015-02-16 MED ORDER — LABETALOL 100 MG TABLET
100.00 mg | ORAL_TABLET | Freq: Two times a day (BID) | ORAL | Status: DC
Start: 2015-02-16 — End: 2015-02-17
  Administered 2015-02-16 – 2015-02-17 (×2): 100 mg via ORAL
  Filled 2015-02-16 (×2): qty 1

## 2015-02-16 MED ORDER — DARBEPOETIN ALFA 60 MCG/0.3 ML IN POLYSORBATE INJECTION SYRINGE
60.0000 ug | INJECTION | Freq: Once | INTRAMUSCULAR | Status: AC
Start: 2015-02-16 — End: 2015-02-16
  Administered 2015-02-16: 60 ug via SUBCUTANEOUS
  Filled 2015-02-16: qty 0.3

## 2015-02-16 MED ADMIN — heparin (porcine) 5,000 unit/mL injection solution: SUBCUTANEOUS | @ 21:00:00

## 2015-02-16 MED ADMIN — docusate sodium 100 mg capsule: ORAL | @ 12:00:00

## 2015-02-16 NOTE — Progress Notes (Signed)
[facility]  Nephrology Consult Follow Up Note      Date of service: 02/16/2015  Hospital Day:  LOS: 6 days     SUBJECTIVE: Feeling about the same without uremic symptoms.     OBJECTIVE:    Vital Signs:  Temp (24hrs) Max:37.1 C (98.7 F)      Temperature: 36.9 C (98.5 F)  BP (Non-Invasive): 140/68 mmHg  MAP (Non-Invasive): 87 mmHG  Heart Rate: (!) 110  Respiratory Rate: 16  Pain Score (Numeric, Faces): 0  SpO2-1: 99 %    Systolic (24hrs), Avg:137 mmHg, Min:127 mmHg, Max:146 mmHg    Diastolic (24hrs), Avg:73 mmHg, Min:55 mmHg, Max:91 mmHg    MAP:  MAP (Non-Invasive)  Avg: 94.7 mmHG  Min: 70 mmHG  Max: 109 mmHG  Heart Rate:  Pulse  Avg: 109.4  Min: 100  Max: 126    Dialysis treatment total fluid removal:  Treatment Total Fluid Removal: 2140 mL  Post dialysis weight:  Weight (Post Dialysis): 92.6 kg (204 lb 2.3 oz)    Diet Order:  DIET RENAL (75GM PROT;2GM K: 2GM NA)  Nutrition:    Orders Placed This Encounter   No orders of the following type(s) were placed in this encounter: Nourishments.     IV Fluids, Meds, and Drips:      I/O:  I/O last 24 hours:    Intake/Output Summary (Last 24 hours) at 02/16/15 1224  Last data filed at 02/16/15 1000   Gross per 24 hour   Intake   2160 ml   Output    200 ml   Net   1960 ml     I/O current shift:  08/23 0800 - 08/23 1559  In: 240 [P.O.:240]  Out: 200 [Urine:200]  I/O last 3 completed shifts:  In: 1920 [P.O.:1920]  Out: -     Labs:  I have reviewed all lab results.    Imaging:  N/A    Current Medications:    Current Facility-Administered Medications:  acetaminophen (TYLENOL) tablet 650 mg Oral Q6H PRN   aspirin chewable tablet 81 mg 81 mg Oral Daily   calcium acetate (PHOSLO) capsule 1,334 mg Oral 3x/day-Meals   clopidogrel (PLAVIX) 75 mg tablet 75 mg Oral Daily   darbepoetin alfa in polysorbate (ARANESP) 60 mcg/0.3 mL injection 60 mcg Subcutaneous Once   heparin 5,000 unit/mL injection 5,000 Units Subcutaneous Q8HRS   labetalol (NORMODYNE) tablet 100 mg Oral Q12H      LORazepam (ATIVAN) 2 mg/mL injection 0.5 mg Intravenous Q4H PRN   magnesium oxide (MAG-OX) tablet 400 mg Oral Daily   nitroGLYCERIN (NITROSTAT) sublingual tablet 0.4 mg Sublingual Q5 Min PRN   NS bolus infusion 1,000 mL 1,000 mL Intravenous Once   NS flush syringe 10 mL Intravenous Q8HRS   NS flush syringe 10 mL Intravenous Q1H PRN   rosuvastatin (CRESTOR) tablet 10 mg Oral QPM       Physical Exam:  General: appears in good health    Impression/Recommendations:  1. ARF due to interstitial nephritis. Urinalysis shows clearing hematuria and pyuria. Scr has stopped rising and looks like it might start declining very soon. No HD today. Once Scr starts to clearly decline, will remove Trialysis catheter and discharge. Reduce Labetalol to avoid over control of HTN. Renal panel in morning.

## 2015-02-16 NOTE — Nurses Notes (Signed)
Pt is aox3.  Pt is ambulatory in room without any difficulties.  Pt is resting in bed at this time.  Possible HD in am.  Will continue to monitor.

## 2015-02-16 NOTE — Progress Notes (Signed)
[facility]    IP PROGRESS NOTE      [place]  Date of Admission:  02/10/2015  Date of Birth:  03/08/1951  Date of Service:  02/16/2015    Chief Complaint: Patient with ARF, new onset. Her PCP referred her to ED.       Subjective: ROS: Patient with no new complaints today. Denies any CP or SOB. Her BP is better controlled. She is urinating some. She does not like drinking water. Says water "tastes bad".     Vital Signs:  Temp (24hrs) Max:37.1 C (98.7 F)      Temperature: 36.9 C (98.4 F)  BP (Non-Invasive): (!) 146/83 mmHg  MAP (Non-Invasive): 98 mmHG  Heart Rate: (!) 106  Respiratory Rate: 16  Pain Score (Numeric, Faces): 0  SpO2-1: 98 %    Current Medications:    Current Facility-Administered Medications:  acetaminophen (TYLENOL) tablet 650 mg Oral Q6H PRN   aspirin chewable tablet 81 mg 81 mg Oral Daily   calcium acetate (PHOSLO) capsule 1,334 mg Oral 3x/day-Meals   clopidogrel (PLAVIX) 75 mg tablet 75 mg Oral Daily   heparin 5,000 unit/mL injection 5,000 Units Subcutaneous Q8HRS   labetalol (NORMODYNE) tablet 100 mg Oral Q12H   LORazepam (ATIVAN) 2 mg/mL injection 0.5 mg Intravenous Q4H PRN   magnesium oxide (MAG-OX) tablet 400 mg Oral Daily   nitroGLYCERIN (NITROSTAT) sublingual tablet 0.4 mg Sublingual Q5 Min PRN   NS bolus infusion 1,000 mL 1,000 mL Intravenous Once   NS flush syringe 10 mL Intravenous Q8HRS   NS flush syringe 10 mL Intravenous Q1H PRN   rosuvastatin (CRESTOR) tablet 10 mg Oral QPM       Today's Physical Exam:  GENERAL: Patient is alert, awake and oriented X 3. In no cardio respiratory distress.   HEENT: PERRLA, EOMI.   NECK: Supple, NO JVD. Trialysis catheter noted in the neck.   HEART: S1+, S2+, rate controlled and rhythm regular. No murmurs appreciated.   LUNGS: Bilateral breath sounds fair, no crackles or wheezing.   ABDOMEN: Soft, NT, ND with NABS.   EXTREMITIES: No edema, pedal pulses palpable.   NEURO:  Motor exam is non focal.   SKIN: No rashes or bruises.      PSYCH: Pleasant with no signs of depression.        I/O:  I/O last 24 hours:      Intake/Output Summary (Last 24 hours) at 02/16/15 1524  Last data filed at 02/16/15 1300   Gross per 24 hour   Intake   2040 ml   Output    200 ml   Net   1840 ml     I/O current shift:  08/23 0800 - 08/23 1559  In: 360 [P.O.:360]  Out: 200 [Urine:200]    Nutrition/Residuals:  DIET RENAL (75GM PROT;2GM K: 2GM NA)    Labs -   reviewed  Reviewed:   Lab Results for Last 24 Hours:    Results for orders placed or performed during the hospital encounter of 02/10/15 (from the past 24 hour(s))   COMPREHENSIVE METABOLIC PANEL, NON-FASTING   Result Value Ref Range    SODIUM 139 136-145 mmol/L    POTASSIUM 3.9 3.4-5.1 mmol/L    CHLORIDE 102 101-111 mmol/L    CO2 TOTAL 26 22-32 mmol/L    ANION GAP 11 3-11 mmol/L    BUN 24 (H) 6-20 mg/dL    CREATININE 1.61 (H) 0.44-1.00 mg/dL    BUN/CREA RATIO 5 (L) 6-22  ESTIMATED GFR 10 (L) >60 mL/min/1.73m2    ALBUMIN 3.2 (L) 3.5-5.0 g/dL    CALCIUM 8.6 (L) 1.6-10.9 mg/dL    GLUCOSE 604 54-098 mg/dL    ALKALINE PHOSPHATASE 84 38-126 U/L    ALT (SGPT) 22 14-54 U/L    AST (SGOT) 26 15-41 U/L    BILIRUBIN TOTAL 0.5 0.3-1.2 mg/dL    PROTEIN TOTAL 5.7 (L) 6.4-8.3 g/dL    ALBUMIN/GLOBULIN RATIO 1.3 0.8-2.0   CBC WITH DIFF   Result Value Ref Range    WBC 11.0 4.0-11.0 x103/uL    RBC 3.96 (L) 4.00-5.10 x106/uL    HGB 10.0 (L) 12.0-15.5 g/dL    HCT 11.9 (L) 14.7-82.9 %    MCV 75.4 (L) 82.0-97.0 fL    MCH 25.2 (L) 27.5-33.2 pg    MCHC 33.4 32.0-36.0 g/dL    RDW 56.2 (H) 13.0-86.5 %    PLATELETS 286 150-450 x103/uL    MPV 7.5 7.4-10.5 fL   MANUAL DIFFERENTIAL   Result Value Ref Range    NEUTROPHIL % 77 (H) 43-76 %    LYMPHOCYTE % 12 (L) 15-43 %    MONOCYTE % 7 5-12 %    EOSINOPHIL % 4 0-5 %    NEUTROPHIL ABSOLUTE 8.47 x103/uL    LYMPHOCYTE ABSOLUTE 1.32 x103/uL    MONOCYTE ABSOLUTE 0.77 x103/uL    EOSINOPHIL ABSOLUTE 0.44 x103/uL    PLATELET ESTIMATE Adequate     MICROCYTOSIS Slight     WBC MORPHOLOGY COMMENT  Normal     WBC 11.0 x103/uL   URINALYSIS, MACROSCOPIC   Result Value Ref Range    COLOR Yellow Yellow, Light Yellow    APPEARANCE Slightly Cloudy (A) Clear    PH 5.0 <8.0    LEUKOCYTES Small (A) Negative WBCs/uL    NITRITE Negative Negative    PROTEIN Negative Negative mg/dL    GLUCOSE Negative Negative mg/dL    KETONES Negative Negative mg/dL    UROBILINOGEN < 2.0 <=7.8 mg/dL    BILIRUBIN Negative Negative mg/dL    BLOOD Negative Negative mg/dL    SPECIFIC GRAVITY 4.696 <1.022   URINALYSIS, MICROSCOPIC   Result Value Ref Range    RBCS 0-2 0-2 /hpf    WBCS 5-10 (A) 0-2 /hpf    BACTERIA Slight (A) None /hpf    SQUAMOUS EPITHELIAL 5-10 (A) 0-2/hpf /hpf    TRANSITIONAL EPITHELIAL 5-10 (A) 0-2/hpf /hpf    MUCOUS Slight (A) None /hpf    RENAL EPITHELIAL 0-2 0-2/hpf /hpf     Ordered:        Diagnostic Tests - reviewed      Radiology Tests - reviewed        Problem List:  Active Hospital Problems   (*Primary Problem)    Diagnosis    *Acute renal failure    Hypomagnesemia    Leukocytosis    Diarrhea    Hypokalemia    HTN (hypertension)    Coronary artery disease     H/O PCI with stent to mid LAD         Assessment/ Plan:     Acute Renal Failure - suspected to be due to acute interstitial nephritis. Dr. Webb Silversmith. Nephrology consult appreciated. Creatinine improving some she has some urine output. HD was started last week - no need for HD today, per Dr. Webb Silversmith. Trialysis catheter was placed on 02/12/15.     Hypertensive Urgency - patient with high BP prior to HD - Dr. Webb Silversmith / Nephrology managing. BP lower after HD -  reduced Labetalol.  Dr. Webb Silversmith following.     Hypokalemia and hypomagnesemia - replace as needed.     ? UTI - was given empiric Antibiotics - this could be sterile pyuria. She has H/O Sterile Pyuria last year    B 12  Deficiency - replaced. Added PO supplements.     H/O CAD - continue ASA and statin. Hold Cozaar.     Obesity with BMI of 35 kg/ m2 - encourage weight loss.     PLAN:    Follow serial labs.          Avoid nephrotoxic drugs.     DVT/PE Prophylaxis: Heparin    She has been off of Protonix as this can cause interstitial nephritis. No NSAIDs PO or as Topical agents.

## 2015-02-17 ENCOUNTER — Inpatient Hospital Stay (HOSPITAL_BASED_OUTPATIENT_CLINIC_OR_DEPARTMENT_OTHER): Payer: Commercial Managed Care - PPO

## 2015-02-17 LAB — COMPREHENSIVE METABOLIC PANEL, NON-FASTING
ALBUMIN/GLOBULIN RATIO: 1.3 (ref 0.8–2.0)
ALBUMIN: 3.2 g/dL — ABNORMAL LOW (ref 3.5–5.0)
ALKALINE PHOSPHATASE: 78 U/L (ref 38–126)
ALT (SGPT): 19 U/L (ref 14–54)
ANION GAP: 10 mmol/L (ref 3–11)
AST (SGOT): 21 U/L (ref 15–41)
BILIRUBIN TOTAL: 0.5 mg/dL (ref 0.3–1.2)
BUN/CREA RATIO: 5 — ABNORMAL LOW (ref 6–22)
BUN: 24 mg/dL — ABNORMAL HIGH (ref 6–20)
CALCIUM: 8.8 mg/dL (ref 8.8–10.2)
CHLORIDE: 100 mmol/L — ABNORMAL LOW (ref 101–111)
CO2 TOTAL: 27 mmol/L (ref 22–32)
CREATININE: 4.67 mg/dL — ABNORMAL HIGH (ref 0.44–1.00)
ESTIMATED GFR: 11 mL/min/1.73mˆ2 — ABNORMAL LOW (ref >60)
GLUCOSE: 110 mg/dL (ref 70–110)
POTASSIUM: 3.8 mmol/L (ref 3.4–5.1)
PROTEIN TOTAL: 5.7 g/dL — ABNORMAL LOW (ref 6.4–8.3)
SODIUM: 137 mmol/L (ref 136–145)
SODIUM: 137 mmol/L (ref 136–145)

## 2015-02-17 LAB — URINALYSIS, MACROSCOPIC
BILIRUBIN: NEGATIVE mg/dL
BLOOD: NEGATIVE mg/dL
GLUCOSE: NEGATIVE mg/dL
KETONES: NEGATIVE mg/dL
NITRITE: NEGATIVE
PH: 5 (ref <8.0)
PROTEIN: NEGATIVE mg/dL
SPECIFIC GRAVITY: 1.009 (ref <1.022)
UROBILINOGEN: <2 mg/dL (ref <=2.0)

## 2015-02-17 LAB — CBC W/AUTO DIFF
BASOPHIL #: 0.1 x10ˆ3/uL (ref 0.00–0.10)
BASOPHIL %: 1 % (ref 0–3)
EOSINOPHIL #: 0.4 x10ˆ3/uL (ref 0.00–0.50)
EOSINOPHIL %: 4 % (ref 0–5)
HCT: 29.5 % — ABNORMAL LOW (ref 36.0–45.0)
HGB: 9.8 g/dL — ABNORMAL LOW (ref 12.0–15.5)
LYMPHOCYTE #: 1.9 x10ˆ3/uL (ref 1.00–4.80)
LYMPHOCYTE %: 18 % (ref 15–43)
MCH: 25.1 pg — ABNORMAL LOW (ref 27.5–33.2)
MCHC: 33.1 g/dL (ref 32.0–36.0)
MCV: 76 fL — ABNORMAL LOW (ref 82.0–97.0)
MONOCYTE #: 0.9 x10ˆ3/uL (ref 0.20–0.90)
MONOCYTE %: 8 % (ref 5–12)
MPV: 7.7 fL (ref 7.4–10.5)
NEUTROPHIL #: 7.3 x10ˆ3/uL — ABNORMAL HIGH (ref 1.50–6.50)
NEUTROPHIL %: 70 % (ref 43–76)
PLATELETS: 278 x10ˆ3/uL (ref 150–450)
RBC: 3.89 x10ˆ6/uL — ABNORMAL LOW (ref 4.00–5.10)
RDW: 16.7 % — ABNORMAL HIGH (ref 11.0–16.0)
WBC: 10.5 x10ˆ3/uL (ref 4.0–11.0)

## 2015-02-17 LAB — RENAL FUNCTION PANEL
ALBUMIN: 3.2 g/dL — ABNORMAL LOW (ref 3.5–5.0)
ANION GAP: 9 mmol/L (ref 3–11)
BUN/CREA RATIO: 5 — ABNORMAL LOW (ref 6–22)
BUN: 24 mg/dL — ABNORMAL HIGH (ref 6–20)
CALCIUM: 8.8 mg/dL (ref 8.8–10.2)
CHLORIDE: 100 mmol/L — ABNORMAL LOW (ref 101–111)
CO2 TOTAL: 29 mmol/L (ref 22–32)
CREATININE: 4.6 mg/dL — ABNORMAL HIGH (ref 0.44–1.00)
ESTIMATED GFR: 12 mL/min/1.73mˆ2 — ABNORMAL LOW (ref >60)
GLUCOSE: 112 mg/dL — ABNORMAL HIGH (ref 70–110)
PHOSPHORUS: 4.5 mg/dL (ref 2.7–4.5)
POTASSIUM: 3.8 mmol/L (ref 3.4–5.1)
SODIUM: 138 mmol/L (ref 136–145)

## 2015-02-17 LAB — URINALYSIS, MICROSCOPIC

## 2015-02-17 MED ORDER — LABETALOL 100 MG TABLET
50.00 mg | ORAL_TABLET | Freq: Two times a day (BID) | ORAL | Status: DC
Start: 2015-02-17 — End: 2015-02-18
  Administered 2015-02-17 – 2015-02-18 (×2): 50 mg via ORAL
  Filled 2015-02-17 (×2): qty 1

## 2015-02-17 NOTE — Nurses Notes (Signed)
Pt is aox3.  Pt has denied any new c/o.  Pt is resting in bed at this time.  Will continue to monitor.

## 2015-02-17 NOTE — Progress Notes (Signed)
[facility]  Nephrology Consult Follow Up Note      Date of service: 02/17/2015  Hospital Day:  LOS: 7 days     SUBJECTIVE: Feeling OK    OBJECTIVE:    Vital Signs:  Temp (24hrs) Max:37.2 C (98.9 F)      Temperature: 36.8 C (98.3 F)  BP (Non-Invasive): (!) 147/86 mmHg  MAP (Non-Invasive): 102 mmHG  Heart Rate: (!) 108  Respiratory Rate: 18  Pain Score (Numeric, Faces): 0  SpO2-1: 97 %    Systolic (24hrs), Avg:137 mmHg, Min:127 mmHg, Max:147 mmHg    Diastolic (24hrs), Avg:80 mmHg, Min:74 mmHg, Max:86 mmHg    MAP:  MAP (Non-Invasive)  Avg: 95.1 mmHG  Min: 70 mmHG  Max: 109 mmHG  Heart Rate:  Pulse  Avg: 109.2  Min: 100  Max: 126    Dialysis treatment total fluid removal:  Treatment Total Fluid Removal: 2140 mL  Post dialysis weight:  Weight (Post Dialysis): 92.6 kg (204 lb 2.3 oz)    Diet Order:  DIET RENAL (75GM PROT;2GM K: 2GM NA)  Nutrition:    Orders Placed This Encounter   No orders of the following type(s) were placed in this encounter: Nourishments.     IV Fluids, Meds, and Drips:      I/O:  I/O last 24 hours:    Intake/Output Summary (Last 24 hours) at 02/17/15 1241  Last data filed at 02/17/15 1000   Gross per 24 hour   Intake    250 ml   Output    950 ml   Net   -700 ml     I/O current shift:  08/24 0800 - 08/24 1559  In: 10 [I.V.:10]  Out: 275 [Urine:275]  I/O last 3 completed shifts:  In: 480 [P.O.:480]  Out: 875 [Urine:875]    Labs:  I have reviewed all lab results.    Imaging:  N/A    Current Medications:    Current Facility-Administered Medications:  acetaminophen (TYLENOL) tablet 650 mg Oral Q6H PRN   aspirin chewable tablet 81 mg 81 mg Oral Daily   calcium acetate (PHOSLO) capsule 1,334 mg Oral 3x/day-Meals   clopidogrel (PLAVIX) 75 mg tablet 75 mg Oral Daily   heparin 5,000 unit/mL injection 5,000 Units Subcutaneous Q8HRS   labetalol (NORMODYNE) tablet 50 mg Oral Q12H   LORazepam (ATIVAN) 2 mg/mL injection 0.5 mg Intravenous Q4H PRN   magnesium oxide (MAG-OX) tablet 400 mg Oral Daily      nitroGLYCERIN (NITROSTAT) sublingual tablet 0.4 mg Sublingual Q5 Min PRN   NS flush syringe 10 mL Intravenous Q8HRS   NS flush syringe 10 mL Intravenous Q1H PRN   rosuvastatin (CRESTOR) tablet 10 mg Oral QPM       Physical Exam:  General: appears in good health  Lungs: Clear to auscultation bilaterally.   Cardiovascular: regular rate and rhythm  no rub  Abdomen: Soft, non-tender  Extremities: No cyanosis or edema  Skin: Skin warm and dry  Neurologic: No asterixis    Impression/Recommendations:  1. ARF secondary to AIN from NSAIDs and PPI, improving. D/C dialysis and Trialysis catheter.   2. HTN -   Disposition: OK to discharge as early as this afternoon after Trialysis catheter removed. It is absolutely essential that she not go back to old meds. She must absolutely avoid NSAID both PO and gels, and not resume PPI. Only BP medication will be Labetalol 50 mg bid. She is to get renal panel and follow up with me in clinic  next week.

## 2015-02-17 NOTE — Progress Notes (Signed)
[facility]    IP PROGRESS NOTE      [place]  Date of Admission:  02/10/2015  Date of Birth:  [birth year]  Date of Service:  02/17/2015    Chief Complaint: Patient with ARF, new onset. Her PCP referred her to ED.       Subjective: ROS: Patient with no new complaints today. Denies any CP or SOB. Her RF is improving.     Vital Signs:  Temp (24hrs) Max:37.2 C (98.9 F)      Temperature: 36.8 C (98.3 F)  BP (Non-Invasive): (!) 147/86 mmHg  MAP (Non-Invasive): 102 mmHG  Heart Rate: (!) 108  Respiratory Rate: 18  Pain Score (Numeric, Faces): 0  SpO2-1: 97 %    Current Medications:    Current Facility-Administered Medications:  acetaminophen (TYLENOL) tablet 650 mg Oral Q6H PRN   aspirin chewable tablet 81 mg 81 mg Oral Daily   calcium acetate (PHOSLO) capsule 1,334 mg Oral 3x/day-Meals   clopidogrel (PLAVIX) 75 mg tablet 75 mg Oral Daily   heparin 5,000 unit/mL injection 5,000 Units Subcutaneous Q8HRS   labetalol (NORMODYNE) tablet 50 mg Oral Q12H   LORazepam (ATIVAN) 2 mg/mL injection 0.5 mg Intravenous Q4H PRN   magnesium oxide (MAG-OX) tablet 400 mg Oral Daily   nitroGLYCERIN (NITROSTAT) sublingual tablet 0.4 mg Sublingual Q5 Min PRN   NS flush syringe 10 mL Intravenous Q8HRS   NS flush syringe 10 mL Intravenous Q1H PRN   rosuvastatin (CRESTOR) tablet 10 mg Oral QPM       Today's Physical Exam:  GENERAL: Patient is alert, awake and oriented X 3. In no cardio respiratory distress.   HEENT: PERRLA, EOMI.   NECK: Supple, NO JVD. Trialysis catheter noted in the neck.   HEART: S1+, S2+, rate controlled and rhythm regular. No murmurs appreciated.   LUNGS: Bilateral breath sounds fair, no crackles or wheezing.   ABDOMEN: Soft, NT, ND with NABS.   EXTREMITIES: No edema, pedal pulses palpable.   NEURO:  Motor exam is non focal.   SKIN: No rashes or bruises.   PSYCH: Pleasant with no signs of depression.        I/O:  I/O last 24 hours:      Intake/Output Summary (Last 24 hours) at 02/17/15 1414  Last data  filed at 02/17/15 1000   Gross per 24 hour   Intake    130 ml   Output    950 ml   Net   -820 ml     I/O current shift:  08/24 0800 - 08/24 1559  In: 10 [I.V.:10]  Out: 275 [Urine:275]    Nutrition/Residuals:  DIET RENAL (75GM PROT;2GM K: 2GM NA)    Labs -   reviewed  Reviewed:   Lab Results for Last 24 Hours:    Results for orders placed or performed during the hospital encounter of 02/10/15 (from the past 24 hour(s))   RENAL FUNCTION PANEL   Result Value Ref Range    SODIUM 138 136-145 mmol/L    POTASSIUM 3.8 3.4-5.1 mmol/L    CHLORIDE 100 (L) 101-111 mmol/L    CO2 TOTAL 29 22-32 mmol/L    ANION GAP 9 3-11 mmol/L    BUN 24 (H) 6-20 mg/dL    CREATININE 1.61 (H) 0.44-1.00 mg/dL    BUN/CREA RATIO 5 (L) 6-22    ESTIMATED GFR 12 (L) >60 mL/min/1.55m2    CALCIUM 8.8 8.8-10.2 mg/dL    GLUCOSE 096 (H) 04-540 mg/dL    PHOSPHORUS  4.5 2.7-4.5 mg/dL    ALBUMIN 3.2 (L) 5.6-2.1 g/dL   COMPREHENSIVE METABOLIC PANEL, NON-FASTING   Result Value Ref Range    SODIUM 137 136-145 mmol/L    POTASSIUM 3.8 3.4-5.1 mmol/L    CHLORIDE 100 (L) 101-111 mmol/L    CO2 TOTAL 27 22-32 mmol/L    ANION GAP 10 3-11 mmol/L    BUN 24 (H) 6-20 mg/dL    CREATININE 3.08 (H) 0.44-1.00 mg/dL    BUN/CREA RATIO 5 (L) 6-22    ESTIMATED GFR 11 (L) >60 mL/min/1.37m2    ALBUMIN 3.2 (L) 3.5-5.0 g/dL    CALCIUM 8.8 6.5-78.4 mg/dL    GLUCOSE 696 29-528 mg/dL    ALKALINE PHOSPHATASE 78 38-126 U/L    ALT (SGPT) 19 14-54 U/L    AST (SGOT) 21 15-41 U/L    BILIRUBIN TOTAL 0.5 0.3-1.2 mg/dL    PROTEIN TOTAL 5.7 (L) 6.4-8.3 g/dL    ALBUMIN/GLOBULIN RATIO 1.3 0.8-2.0   CBC W/AUTO DIFF - [facility] ONLY   Result Value Ref Range    WBC 10.5 4.0-11.0 x103/uL    RBC 3.89 (L) 4.00-5.10 x106/uL    HGB 9.8 (L) 12.0-15.5 g/dL    HCT 41.3 (L) 24.4-01.0 %    MCV 76.0 (L) 82.0-97.0 fL    MCH 25.1 (L) 27.5-33.2 pg    MCHC 33.1 32.0-36.0 g/dL    RDW 27.2 (H) 53.6-64.4 %    PLATELETS 278 150-450 x103/uL    MPV 7.7 7.4-10.5 fL    NEUTROPHIL % 70 43-76 %    LYMPHOCYTE % 18 15-43 %     MONOCYTE % 8 5-12 %    EOSINOPHIL % 4 0-5 %    BASOPHIL % 1 0-3 %    NEUTROPHIL # 7.30 (H) 1.50-6.50 x103/uL    LYMPHOCYTE # 1.90 1.00-4.80 x103/uL    MONOCYTE # 0.90 0.20-0.90 x103/uL    EOSINOPHIL # 0.40 0.00-0.50 x103/uL    BASOPHIL # 0.10 0.00-0.10 x103/uL     Ordered:        Diagnostic Tests - reviewed      Radiology Tests - reviewed        Problem List:  Active Hospital Problems   (*Primary Problem)    Diagnosis    *Acute renal failure    Hypomagnesemia    Leukocytosis    Diarrhea    Hypokalemia    HTN (hypertension)    Coronary artery disease     H/O PCI with stent to mid LAD         Assessment/ Plan:     Acute Renal Failure - suspected to be due to acute interstitial nephritis. Dr. Webb Silversmith. Nephrology consult appreciated. Creatinine improving some she has some urine output. No longer need for HD. Per Dr. Webb Silversmith. Trialysis catheter to be removed.     Hypertensive Urgency - patient with high BP prior to HD - Dr. Webb Silversmith / Nephrology managing. BP lower after HD -Labetalol.  Dr. Webb Silversmith following.     Hypokalemia and hypomagnesemia - replace as needed.     ? UTI - was given empiric Antibiotics - this could be sterile pyuria. She has H/O Sterile Pyuria last year. Will repeat UA.     B 12  Deficiency - replaced. Added PO supplements.     H/O CAD - continue ASA and statin. Hold Cozaar.     Obesity with BMI of 35 kg/ m2 - encourage weight loss.     PLAN:    Follow serial labs.  Avoid nephrotoxic drugs.      DVT/PE Prophylaxis: Heparin    She has been off of Protonix as this can cause interstitial nephritis. No NSAIDs PO or as Topical agents.

## 2015-02-17 NOTE — Consults (Signed)
IR    Pt resting in bed.  Explained procedure of removing Trialysis catheter which she stated that she understood.  Skin cleansed with chlorhexidine Sutures removed and trialysis catheter removed intact.  Pressure held for about 5 minutes until hemostasis achieved.  Covered w DSD.  No swelling or hematoma.  Pt tolerated well.

## 2015-02-17 NOTE — Discharge Instructions (Signed)
Please Schedule Follow up appointment with Dr. Webb Silversmith for next week

## 2015-02-18 LAB — BASIC METABOLIC PANEL
ANION GAP: 10 mmol/L (ref 3–11)
BUN/CREA RATIO: 6 (ref 6–22)
BUN: 23 mg/dL — ABNORMAL HIGH (ref 6–20)
CALCIUM: 9.2 mg/dL (ref 8.8–10.2)
CHLORIDE: 99 mmol/L — ABNORMAL LOW (ref 101–111)
CO2 TOTAL: 29 mmol/L (ref 22–32)
CREATININE: 4.02 mg/dL — ABNORMAL HIGH (ref 0.44–1.00)
ESTIMATED GFR: 14 mL/min/1.73m?2 — ABNORMAL LOW (ref >60)
ESTIMATED GFR: 14 mL/min/{1.73_m2} — ABNORMAL LOW (ref >60)
GLUCOSE: 109 mg/dL (ref 70–110)
POTASSIUM: 3.8 mmol/L (ref 3.4–5.1)
SODIUM: 138 mmol/L (ref 136–145)

## 2015-02-18 MED ORDER — LABETALOL 100 MG TABLET
100.00 mg | ORAL_TABLET | Freq: Two times a day (BID) | ORAL | Status: DC
Start: 2015-02-18 — End: 2016-05-26

## 2015-02-18 MED ORDER — LABETALOL 100 MG TABLET
100.00 mg | ORAL_TABLET | Freq: Two times a day (BID) | ORAL | Status: DC
Start: 2015-02-18 — End: 2015-02-18
  Administered 2015-02-18: 100 mg via ORAL
  Filled 2015-02-18: qty 1

## 2015-02-18 MED ORDER — CALCIUM ACETATE(PHOSPHATE BINDERS) 667 MG CAPSULE
1334.00 mg | ORAL_CAPSULE | Freq: Three times a day (TID) | ORAL | Status: DC
Start: 2015-02-18 — End: 2015-08-24

## 2015-02-18 NOTE — Care Plan (Signed)
Problem: General Plan of Care(Adult,OB)  Goal: Plan of Care Review(Adult,OB)  The patient and/or their representative will communicate an understanding of their plan of care   Outcome: Adequate for Discharge Date Met:  02/18/15  PT GIVEN HER DISCHARGE INSTRUCTIONS WITH A VERBAL REVIEW OF HER PRESCRIPTIONS AND HER HOMEGOING MEDS. TOLD HER THAT SHE CAN PICK UP HER PRESCRIPTIONS AT [facility] ON [address]. PT STATES SHE WAS GIVEN VERBAL AND WRITTEN INSTRUCTIONS ON HER RENAL DIET. SHE SAYS SHE FEELS SHE UNDERSTANDS IT AND SHE HAS NO FURTHER QUESTIONS. TOLD HER IF SHE HAS ANY QUESTIONS CONCERNING HER DIET SHE COULD ALWAYS CONTACT OUR NUTRITIONIST THROUGH OUR OPERATOR. TOLD PT SHE HAS A F/U APPT WITH DR [facility] 8/30 AT 10:20.

## 2015-02-18 NOTE — Care Plan (Signed)
Problem: General Plan of Care(Adult,OB)  Goal: Plan of Care Review(Adult,OB)  The patient and/or their representative will communicate an understanding of their plan of care   Outcome: Ongoing (see interventions/notes)  Alert and oriented. Ambulates herself to bathroom with no issue. Encouraged fluid throughout shift. Denies pain.  Pt currently resting in bed with eye closed. No s/s distress noted. Will monitor.

## 2015-02-18 NOTE — Nurses Notes (Signed)
Patient charged left in room - attempted to call patient. LM x1.

## 2015-02-18 NOTE — Progress Notes (Signed)
Because of increasing BP, I increased Labetalol to 100 mg bid - please discharge on this dose. Thanks

## 2015-02-18 NOTE — Nurses Notes (Signed)
Pt will have a ride for DC around 6pm when her daughter gets off work. Pt has been resting throughout shift. HR elevates to 140's on ambulation. Normal for pt. Pt has appointment for follow up with Dr. Webb Silversmith scheduled in the computer. Call bell in reach.

## 2015-02-19 LAB — URINE CULTURE,ROUTINE: URINE CULTURE: 20000 — AB

## 2015-02-22 ENCOUNTER — Ambulatory Visit (HOSPITAL_BASED_OUTPATIENT_CLINIC_OR_DEPARTMENT_OTHER): Payer: Commercial Managed Care - PPO | Attending: Nephrology

## 2015-02-22 DIAGNOSIS — N179 Acute kidney failure, unspecified: Secondary | ICD-10-CM | POA: Insufficient documentation

## 2015-02-22 LAB — RENAL FUNCTION PANEL
ALBUMIN: 4 g/dL (ref 3.5–5.0)
ANION GAP: 10 mmol/L (ref 3–11)
BUN/CREA RATIO: 8 (ref 6–22)
BUN: 19 mg/dL (ref 6–20)
CALCIUM: 9.6 mg/dL (ref 8.8–10.2)
CHLORIDE: 100 mmol/L — ABNORMAL LOW (ref 101–111)
CO2 TOTAL: 29 mmol/L (ref 22–32)
CREATININE: 2.48 mg/dL — ABNORMAL HIGH (ref 0.44–1.00)
ESTIMATED GFR: 24 mL/min/1.73mˆ2 — ABNORMAL LOW (ref >60)
GLUCOSE: 103 mg/dL (ref 70–110)
PHOSPHORUS: 3.4 mg/dL (ref 2.7–4.5)
POTASSIUM: 3.4 mmol/L (ref 3.4–5.1)
SODIUM: 139 mmol/L (ref 136–145)

## 2015-02-22 NOTE — Care Management Notes (Signed)
IM form sent via regular delivery mail.

## 2015-03-07 NOTE — Discharge Summary (Signed)
[facility]  [place], [place] [address]    DISCHARGE SUMMARY      PATIENT NAME:  [name], [name]  MRN:  [id number]  DOB:  1951/04/25    ADMISSION DATE:  02/10/2015  DISCHARGE DATE: 02/18/2015  ATTENDING PHYSICIAN: Arta Silence MD  PRIMARY CARE PHYSICIAN: Sid Falcon, MD     ADMISSION DIAGNOSIS: Acute renal failure  DISCHARGE DIAGNOSIS:   Active Hospital Problems    Diagnosis Date Noted    Principle Problem: Acute renal failure 02/10/2015    Hypomagnesemia 02/10/2015    Leukocytosis 02/10/2015    Diarrhea 02/10/2015    Hypokalemia 04/16/2014    HTN (hypertension)     Coronary artery disease       Resolved Hospital Problems    Diagnosis    No resolved problems to display.     Active Non-Hospital Problems    Diagnosis Date Noted    Prolonged Q-T interval on ECG 02/10/2015    Essential hypertension 04/16/2014    Mixed hyperlipidemia 07/30/2013    NSTEMI (non-ST elevated myocardial infarction) 12/16/2012      DISCHARGE MEDICATIONS:     Current Discharge Medication List      START taking these medications.       Details    calcium acetate 667 mg Capsule   Commonly known as:  PHOSLO    1,334 mg, Oral, 3 TIMES DAILY WITH MEALS   Qty:  90 Cap   Refills:  0       labetalol 100 mg Tablet   Commonly known as:  NORMODYNE    100 mg, Oral, EVERY 12 HOURS   Qty:  60 Tab   Refills:  0         CONTINUE these medications - NO CHANGES were made during your visit.       Details    CHILDREN'S ASPIRIN 81 mg Tablet, Chewable   Generic drug:  aspirin    TAKE 1 TABLET DAILY   Qty:  90 Tab   Refills:  4       clopidogrel 75 mg Tablet   Commonly known as:  PLAVIX    75 mg, Oral, DAILY, .Follow Blood work with PCP.   Qty:  90 Tab   Refills:  3       CRESTOR 20 mg Tablet   Generic drug:  rosuvastatin    TAKE 1 TABLET EVERY EVENING   Qty:  90 Tab   Refills:  2       cyclobenzaprine 10 mg Tablet   Commonly known as:  FLEXERIL    10 mg, Oral, 3 TIMES DAILY   Refills:  0       FETZIMA 40 mg  Capsule,Sustained Action 24 hr   Generic drug:  levomilnacipran    40 mg, Oral, DAILY   Refills:  0       nitroGLYCERIN 0.3 mg Tablet, Sublingual   Commonly known as:  NITROSTAT    0.3 mg, Sublingual, EVERY 5 MIN PRN, for 3 doses over 15 minutes   Qty:  25 Tab   Refills:  1         STOP taking these medications.          Diclofenac Sodium 3 % Gel       losartan 50 mg Tablet   Commonly known as:  COZAAR       metoprolol 100 mg Tablet   Commonly known as:  LOPRESSOR       pantoprazole 40  mg Tablet, Delayed Release (E.C.)   Commonly known as:  PROTONIX       spironolactone 25 mg Tablet   Commonly known as:  ALDACTONE           DISCHARGE INSTRUCTIONS:     DISCHARGE INSTRUCTION - DIET   Diet: CARDIAC DIET      ASPIRIN ALREADY ORDERED     DISCHARGE INSTRUCTION - ACTIVITY   Activity: GRADUALLY INCREASE ACTIVITY AS TOLERATED        REASON FOR HOSPITALIZATION AND HOSPITAL COURSE:  This is a 64 y.o., female admitted on account of ARF, severe, probably due to NSAIDs. Has been on combined oral and gel Diclofenac, and oral dose was increased to twice a day in July. Also takes some intermittent OTC Aleve. She also had Pyuria without symptoms of UTI. She was admitted and managed by the renal team as well as Nephrology. She had HD and improved afterwards. Adjustments were made to her home meds. She was d/c to f/u with PCP and Nephrology.     SIGNIFICANT PHYSICAL FINDINGS:   VS: T;37.1, P;116, R;18, B/P 151/91  GENERAL: Patient is alert, awake and oriented X 3. In no cardio respiratory distress.   HEENT: PERRLA, EOMI.   NECK: Supple, NO JVD. Trialysis catheter noted in the neck.   HEART: S1+, S2+, rate controlled and rhythm regular. No murmurs appreciated.   LUNGS: Bilateral breath sounds fair, no crackles or wheezing.   ABDOMEN: Soft, NT, ND with NABS.   EXTREMITIES: No edema, pedal pulses palpable.   NEURO: Motor exam is non focal.   SKIN: No rashes or bruises.   PSYCH: Pleasant with no signs of  depression.    SIGNIFICANT LAB:  See EMR      SIGNIFICANT RADIOLOGY:   Results for orders placed or performed during the hospital encounter of 02/10/15   CT ABDOMEN PELVIS WO IV CONTRAST     Status: None    Narrative    RADIOLOGIST: Janne Lab, MD/la     CT OF THE ABDOMEN AND PELVIS WITHOUT CONTRAST (DLP:  1327.3 mGy-cm):      HISTORY:  Left lower quadrant abdominal pain.     FINDINGS:  The lung bases are clear.  There is coronary calcification   seen.  The liver is normal.  There is sludge within the gallbladder.    Gallbladder is not distended.  There is no pericholecystic fluid or   biliary dilatation.  The spleen, pancreas, adrenal glands and kidneys are   normal.  There is evidence of bowel surgery in the left lower quadrant.    There is no evidence for bowel obstruction.  No diverticulitis.    Degenerative changes are seen in the lumbar spine.  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       Impression       1.  No diverticulitis.  2.  Sludge in the gallbladder.    3.  There might be mild thickening of the urinary bladder.            US KIDNEY     Status: None    Narrative    RADIOLOGIST: Randall Hiss, MD/pb     RENAL ULTRASOUND     CLINICAL HISTORY:  Urinary tract infection.  Acute renal failure.     COMMENTS:  The kidneys are normal in size and echotexture with the right   at 13 cm and the left at 12.3 cm.  No hydronephrosis.  No masses, or large   calculi are identified.  The urinary bladder is unremarkable.       Impression       Normal renal ultrasound.            IR IJ TRIALYSIS CATH PLACEMENT     Status: None    Narrative    RADIOLOGIST: Janne Lab, MD/la     RIGHT INTERNAL JUGULAR VEIN TRIALYSIS CATHETER PLACEMENT:     HISTORY:  Acute renal failure.     PROCEDURE:  Consent obtained.  The patient was prepped and draped in a   sterile fashion.  One percent lidocaine was given for local anesthesia.    Using ultrasound guidance, the right IJ vein was punctured with a 21-gauge    needle and a wire introduced in the SVC.  After dilating, a trialysis   catheter was placed with the tip in the proximal right atrium.  All 3   lumens were flushed and aspirated normally and were filled with   appropriate concentration of heparin.  The catheter was sutured to the   skin and a dressing applied.  The patient tolerated the procedure well.       Impression       As above.            IR TUNNEL DIALYSIS CATHETER REMOVAL     Status: None    Narrative    RADIOLOGIST: Janne Lab, MD/ps     TRIALYSIS CATHETER REMOVAL:     Trialysis catheter was removed by Lajoyce Lauber, NP at the bedside.       Impression       As above.                CONSULTATIONS:  Nephrology    COURSE IN HOSPITAL: As above.  Please see Dr. Corinna Gab H&P for admission details.    Discharge Assessment/ Plan:     Acute Renal Failure - suspected to be due to acute interstitial nephritis. Dr. Webb Silversmith. Nephrology consult appreciated. Creatinine improving some she has some urine output. No longer need for HD. Per Dr. Webb Silversmith. Trialysis catheter to be removed. And D/C.     Hypertensive Urgency - patient with high BP prior to HD - Dr. Webb Silversmith / Nephrology managing. BP lower after HD -Labetalol dose increased. Dr. Webb Silversmith following.     Hypokalemia and hypomagnesemia - replace as needed.     ? UTI - was given empiric Antibiotics - this could be sterile pyuria. She has H/O Sterile Pyuria last year.     B 12 Deficiency - replaced. Added PO supplements.     H/O CAD - continue ASA and statin. Hold Cozaar.     Obesity with BMI of 35 kg/ m2 - encourage weight loss.     D/C to F/U with PCP, Nephrology        CONDITION ON DISCHARGE: VS Stable    DISCHARGE DISPOSITION:  Home discharge     cc: Primary Care Physician:  Sid Falcon, MD  [facility] [id number] [place]  [place] [address]     UE:[id number] Physician:  No referring provider defined for this encounter.

## 2015-05-14 ENCOUNTER — Ambulatory Visit (HOSPITAL_BASED_OUTPATIENT_CLINIC_OR_DEPARTMENT_OTHER): Payer: Commercial Managed Care - PPO | Attending: Nephrology

## 2015-05-14 DIAGNOSIS — I129 Hypertensive chronic kidney disease with stage 1 through stage 4 chronic kidney disease, or unspecified chronic kidney disease: Secondary | ICD-10-CM | POA: Insufficient documentation

## 2015-05-14 DIAGNOSIS — I10 Essential (primary) hypertension: Secondary | ICD-10-CM

## 2015-05-14 DIAGNOSIS — N179 Acute kidney failure, unspecified: Secondary | ICD-10-CM | POA: Insufficient documentation

## 2015-05-14 LAB — RENAL FUNCTION PANEL
ALBUMIN: 3.8 g/dL (ref 3.5–5.0)
ANION GAP: 8 mmol/L (ref 3–11)
BUN/CREA RATIO: 9 (ref 6–22)
BUN: 11 mg/dL (ref 6–20)
CALCIUM: 9.3 mg/dL (ref 8.8–10.2)
CHLORIDE: 105 mmol/L (ref 101–111)
CO2 TOTAL: 31 mmol/L (ref 22–32)
CREATININE: 1.17 mg/dL — ABNORMAL HIGH (ref 0.44–1.00)
ESTIMATED GFR: 56 mL/min/1.73mˆ2 — ABNORMAL LOW (ref >60)
GLUCOSE: 114 mg/dL — ABNORMAL HIGH (ref 70–110)
PHOSPHORUS: 2.1 mg/dL — ABNORMAL LOW (ref 2.7–4.5)
POTASSIUM: 2.7 mmol/L — ABNORMAL LOW (ref 3.4–5.1)
SODIUM: 144 mmol/L (ref 136–145)

## 2015-05-30 ENCOUNTER — Other Ambulatory Visit: Payer: Self-pay

## 2015-08-24 ENCOUNTER — Observation Stay (HOSPITAL_BASED_OUTPATIENT_CLINIC_OR_DEPARTMENT_OTHER)
Admission: EM | Admit: 2015-08-24 | Discharge: 2015-08-27 | Disposition: A | Discharge status: Home / Self Care | Payer: Commercial Managed Care - PPO | Attending: Internal Medicine | Admitting: Internal Medicine

## 2015-08-24 ENCOUNTER — Emergency Department (HOSPITAL_BASED_OUTPATIENT_CLINIC_OR_DEPARTMENT_OTHER): Payer: Commercial Managed Care - PPO

## 2015-08-24 ENCOUNTER — Encounter (HOSPITAL_BASED_OUTPATIENT_CLINIC_OR_DEPARTMENT_OTHER): Payer: Self-pay

## 2015-08-24 DIAGNOSIS — R0602 Shortness of breath: Secondary | ICD-10-CM

## 2015-08-24 DIAGNOSIS — I252 Old myocardial infarction: Secondary | ICD-10-CM | POA: Insufficient documentation

## 2015-08-24 DIAGNOSIS — F419 Anxiety disorder, unspecified: Secondary | ICD-10-CM | POA: Insufficient documentation

## 2015-08-24 DIAGNOSIS — F329 Major depressive disorder, single episode, unspecified: Secondary | ICD-10-CM | POA: Insufficient documentation

## 2015-08-24 DIAGNOSIS — E669 Obesity, unspecified: Secondary | ICD-10-CM | POA: Insufficient documentation

## 2015-08-24 DIAGNOSIS — I10 Essential (primary) hypertension: Secondary | ICD-10-CM

## 2015-08-24 DIAGNOSIS — I2510 Atherosclerotic heart disease of native coronary artery without angina pectoris: Secondary | ICD-10-CM

## 2015-08-24 DIAGNOSIS — E876 Hypokalemia: Secondary | ICD-10-CM | POA: Insufficient documentation

## 2015-08-24 DIAGNOSIS — R61 Generalized hyperhidrosis: Secondary | ICD-10-CM | POA: Insufficient documentation

## 2015-08-24 DIAGNOSIS — E782 Mixed hyperlipidemia: Secondary | ICD-10-CM | POA: Diagnosis present

## 2015-08-24 DIAGNOSIS — Z955 Presence of coronary angioplasty implant and graft: Secondary | ICD-10-CM

## 2015-08-24 DIAGNOSIS — D649 Anemia, unspecified: Secondary | ICD-10-CM | POA: Insufficient documentation

## 2015-08-24 DIAGNOSIS — Z7982 Long term (current) use of aspirin: Secondary | ICD-10-CM | POA: Insufficient documentation

## 2015-08-24 DIAGNOSIS — R079 Chest pain, unspecified: Principal | ICD-10-CM | POA: Insufficient documentation

## 2015-08-24 DIAGNOSIS — M549 Dorsalgia, unspecified: Secondary | ICD-10-CM | POA: Insufficient documentation

## 2015-08-24 DIAGNOSIS — Z7902 Long term (current) use of antithrombotics/antiplatelets: Secondary | ICD-10-CM | POA: Insufficient documentation

## 2015-08-24 DIAGNOSIS — Z79899 Other long term (current) drug therapy: Secondary | ICD-10-CM | POA: Insufficient documentation

## 2015-08-24 DIAGNOSIS — G8929 Other chronic pain: Secondary | ICD-10-CM | POA: Insufficient documentation

## 2015-08-24 DIAGNOSIS — Z23 Encounter for immunization: Secondary | ICD-10-CM | POA: Insufficient documentation

## 2015-08-24 HISTORY — DX: Depression, unspecified: F32.A

## 2015-08-24 LAB — COMPREHENSIVE METABOLIC PROFILE - BMC/JMC ONLY
ALBUMIN/GLOBULIN RATIO: 1.8 (ref 0.8–2.0)
ALBUMIN: 4 g/dL (ref 3.5–5.0)
ALKALINE PHOSPHATASE: 73 U/L (ref 38–126)
ALT (SGPT): 13 U/L — ABNORMAL LOW (ref 14–54)
ANION GAP: 6 mmol/L (ref 3–11)
AST (SGOT): 19 U/L (ref 15–41)
BILIRUBIN TOTAL: 1 mg/dL (ref 0.3–1.2)
BUN/CREA RATIO: 17 (ref 6–22)
BUN: 17 mg/dL (ref 6–20)
CALCIUM: 9.3 mg/dL (ref 8.8–10.2)
CHLORIDE: 104 mmol/L (ref 101–111)
CO2 TOTAL: 30 mmol/L (ref 22–32)
CREATININE: 1.02 mg/dL — ABNORMAL HIGH (ref 0.44–1.00)
ESTIMATED GFR: >60 mL/min/1.73mˆ2 (ref >60)
GLUCOSE: 108 mg/dL (ref 70–110)
POTASSIUM: 2.9 mmol/L — ABNORMAL LOW (ref 3.4–5.1)
PROTEIN TOTAL: 6.2 g/dL — ABNORMAL LOW (ref 6.4–8.3)
SODIUM: 140 mmol/L (ref 136–145)

## 2015-08-24 LAB — CBC WITH DIFF
BASOPHIL #: 0.1 x10ˆ3/uL (ref 0.00–0.10)
BASOPHIL %: 1 % (ref 0–3)
EOSINOPHIL #: 0.2 x10ˆ3/uL (ref 0.00–0.50)
EOSINOPHIL %: 3 % (ref 0–5)
HCT: 30.1 % — ABNORMAL LOW (ref 36.0–45.0)
HGB: 9.8 g/dL — ABNORMAL LOW (ref 12.0–15.5)
LYMPHOCYTE #: 1.4 x10ˆ3/uL (ref 1.00–4.80)
LYMPHOCYTE %: 21 % (ref 15–43)
MCH: 28.8 pg (ref 27.5–33.2)
MCHC: 32.6 g/dL (ref 32.0–36.0)
MCV: 88.4 fL (ref 82.0–97.0)
MONOCYTE #: 0.5 x10?3/uL (ref 0.20–0.90)
MONOCYTE #: 0.5 x10ˆ3/uL (ref 0.20–0.90)
MONOCYTE %: 7 % (ref 5–12)
MPV: 7.7 fL (ref 7.4–10.5)
NEUTROPHIL #: 4.8 x10ˆ3/uL (ref 1.50–6.50)
NEUTROPHIL %: 68 % (ref 43–76)
PLATELETS: 377 x10ˆ3/uL (ref 150–450)
RBC: 3.4 x10ˆ6/uL — ABNORMAL LOW (ref 4.00–5.10)
RDW: 20.9 % — ABNORMAL HIGH (ref 11.0–16.0)
RDW: 20.9 % — ABNORMAL HIGH (ref 11.0–16.0)
WBC: 7 x10ˆ3/uL (ref 4.0–11.0)

## 2015-08-24 LAB — TROPONIN-I
TROPONIN I: <0.03 ng/mL (ref <=0.06)
TROPONIN I: <0.03 ng/mL (ref <=0.06)
TROPONIN I: <0.03 ng/mL (ref <=0.06)
TROPONIN I: <0.03 ng/mL (ref <=0.06)
TROPONIN I: <0.03 ng/mL (ref <=0.06)

## 2015-08-24 LAB — PT/INR
INR: 1.13
PROTHROMBIN TIME: 12.5 s (ref 9.4–12.5)

## 2015-08-24 LAB — B-TYPE NATRIURETIC PEPTIDE (BNP),PLASMA: BNP: 36 pg/mL (ref 0–100)

## 2015-08-24 MED ORDER — BUPROPION HCL SR 150 MG TABLET,12 HR SUSTAINED-RELEASE
150.00 mg | ORAL_TABLET | Freq: Two times a day (BID) | ORAL | Status: DC
Start: 2015-08-24 — End: 2015-08-24
  Administered 2015-08-24: 0 mg via ORAL

## 2015-08-24 MED ORDER — MORPHINE 4 MG/ML INTRAVENOUS CARTRIDGE
4.0000 mg | CARTRIDGE | INTRAVENOUS | Status: DC | PRN
Start: 2015-08-24 — End: 2015-08-27
  Administered 2015-08-25 (×2): 4 mg via INTRAVENOUS
  Filled 2015-08-24 (×2): qty 1

## 2015-08-24 MED ORDER — SODIUM CHLORIDE 0.9 % (FLUSH) INJECTION SYRINGE
10.0000 mL | INJECTION | INTRAMUSCULAR | Status: DC | PRN
Start: 2015-08-24 — End: 2015-08-27

## 2015-08-24 MED ORDER — ASPIRIN 81 MG CHEWABLE TABLET
243.00 mg | CHEWABLE_TABLET | ORAL | Status: AC
Start: 2015-08-24 — End: 2015-08-24
  Administered 2015-08-24: 243 mg via ORAL
  Filled 2015-08-24: qty 3

## 2015-08-24 MED ORDER — ASPIRIN 81 MG CHEWABLE TABLET
81.0000 mg | CHEWABLE_TABLET | Freq: Every day | ORAL | Status: DC
Start: 2015-08-24 — End: 2015-08-27
  Administered 2015-08-24: 0 mg via ORAL
  Administered 2015-08-25 – 2015-08-27 (×3): 81 mg via ORAL
  Filled 2015-08-24 (×3): qty 1

## 2015-08-24 MED ORDER — BUPROPION HCL SR 150 MG TABLET,12 HR SUSTAINED-RELEASE
150.00 mg | ORAL_TABLET | Freq: Every evening | ORAL | Status: DC
Start: 2015-08-25 — End: 2015-08-24

## 2015-08-24 MED ORDER — NITROGLYCERIN 0.4 MG SUBLINGUAL TABLET
0.40 mg | SUBLINGUAL_TABLET | SUBLINGUAL | Status: DC | PRN
Start: 2015-08-24 — End: 2015-08-27
  Administered 2015-08-25 (×2): 0.4 mg via SUBLINGUAL
  Filled 2015-08-24 (×2): qty 1

## 2015-08-24 MED ORDER — ESCITALOPRAM 10 MG TABLET
20.0000 mg | ORAL_TABLET | Freq: Every day | ORAL | Status: DC
Start: 2015-08-24 — End: 2015-08-27
  Administered 2015-08-24: 0 mg via ORAL
  Administered 2015-08-25 – 2015-08-27 (×3): 20 mg via ORAL
  Filled 2015-08-24 (×3): qty 2

## 2015-08-24 MED ORDER — PNEUMOCOCCAL 13-VAL CONJ VACCINE-DIP CRM (PF) 0.5 ML IM SYRINGE
0.5000 mL | INJECTION | Freq: Once | INTRAMUSCULAR | Status: AC
Start: 2015-08-24 — End: 2015-08-26
  Administered 2015-08-26: 0.5 mL via INTRAMUSCULAR
  Filled 2015-08-24: qty 0.5

## 2015-08-24 MED ORDER — SODIUM CHLORIDE 0.9 % (FLUSH) INJECTION SYRINGE
10.00 mL | INJECTION | Freq: Three times a day (TID) | INTRAMUSCULAR | Status: DC
Start: 2015-08-24 — End: 2015-08-24
  Administered 2015-08-24 (×2): 0 mL via INTRAVENOUS

## 2015-08-24 MED ORDER — CYCLOBENZAPRINE 10 MG TABLET
10.00 mg | ORAL_TABLET | Freq: Every evening | ORAL | Status: DC | PRN
Start: 2015-08-24 — End: 2015-08-27

## 2015-08-24 MED ORDER — ENOXAPARIN 40 MG/0.4 ML SUB-Q SYRINGE - EAST
40.0000 mg | INJECTION | Freq: Every day | SUBCUTANEOUS | Status: DC
Start: 2015-08-24 — End: 2015-08-27
  Administered 2015-08-24 – 2015-08-27 (×4): 40 mg via SUBCUTANEOUS
  Filled 2015-08-24 (×4): qty 0.4

## 2015-08-24 MED ORDER — ACETAMINOPHEN 325 MG TABLET
650.0000 mg | ORAL_TABLET | Freq: Four times a day (QID) | ORAL | Status: DC | PRN
Start: 2015-08-24 — End: 2015-08-27

## 2015-08-24 MED ORDER — SODIUM CHLORIDE 0.9 % (FLUSH) INJECTION SYRINGE
10.0000 mL | INJECTION | Freq: Three times a day (TID) | INTRAMUSCULAR | Status: DC
Start: 2015-08-24 — End: 2015-08-27
  Administered 2015-08-24: 0 mL via INTRAVENOUS
  Administered 2015-08-24 – 2015-08-25 (×2): 10 mL via INTRAVENOUS
  Administered 2015-08-25: 0 mL via INTRAVENOUS
  Administered 2015-08-25 – 2015-08-27 (×6): 10 mL via INTRAVENOUS

## 2015-08-24 MED ORDER — POTASSIUM CHLORIDE 20 MEQ/15 ML ORAL LIQUID
40.00 meq | ORAL | Status: AC
Start: 2015-08-24 — End: 2015-08-24
  Administered 2015-08-24: 20 meq via ORAL
  Filled 2015-08-24: qty 30

## 2015-08-24 MED ORDER — CITALOPRAM 20 MG TABLET
20.0000 mg | ORAL_TABLET | Freq: Every day | ORAL | Status: DC
Start: 2015-08-24 — End: 2015-08-24
  Administered 2015-08-24: 0 mg via ORAL

## 2015-08-24 MED ORDER — ROSUVASTATIN 20 MG TABLET
20.00 mg | ORAL_TABLET | Freq: Every evening | ORAL | Status: DC
Start: 2015-08-24 — End: 2015-08-27
  Administered 2015-08-24 – 2015-08-26 (×3): 20 mg via ORAL
  Filled 2015-08-24 (×3): qty 1

## 2015-08-24 MED ORDER — CLOPIDOGREL 75 MG TABLET
75.0000 mg | ORAL_TABLET | Freq: Every day | ORAL | Status: DC
Start: 2015-08-24 — End: 2015-08-27
  Administered 2015-08-24: 0 mg via ORAL
  Administered 2015-08-25 – 2015-08-27 (×3): 75 mg via ORAL
  Filled 2015-08-24 (×3): qty 1

## 2015-08-24 MED ORDER — POTASSIUM CHLORIDE ER 20 MEQ TABLET,EXTENDED RELEASE(PART/CRYST)
40.00 meq | ORAL_TABLET | ORAL | Status: DC
Start: 2015-08-24 — End: 2015-08-24

## 2015-08-24 MED ORDER — NITROGLYCERIN 2 % TRANSDERMAL OINTMENT - PACKET
0.5000 [in_us] | TOPICAL_OINTMENT | Freq: Once | TRANSDERMAL | Status: AC
Start: 2015-08-24 — End: 2015-08-24
  Administered 2015-08-24: 0.5 [in_us] via TOPICAL
  Filled 2015-08-24: qty 2

## 2015-08-24 MED ORDER — CYCLOBENZAPRINE 10 MG TABLET
10.00 mg | ORAL_TABLET | Freq: Three times a day (TID) | ORAL | Status: DC
Start: 2015-08-24 — End: 2015-08-24
  Administered 2015-08-24: 0 mg via ORAL

## 2015-08-24 MED ORDER — BUPROPION HCL SR 150 MG TABLET,12 HR SUSTAINED-RELEASE
150.00 mg | ORAL_TABLET | Freq: Every evening | ORAL | Status: DC
Start: 2015-08-24 — End: 2015-08-27
  Administered 2015-08-24 – 2015-08-26 (×3): 150 mg via ORAL
  Filled 2015-08-24 (×3): qty 1

## 2015-08-24 MED ORDER — LABETALOL 100 MG TABLET
100.00 mg | ORAL_TABLET | Freq: Two times a day (BID) | ORAL | Status: DC
Start: 2015-08-24 — End: 2015-08-27
  Administered 2015-08-24 (×2): 100 mg via ORAL
  Administered 2015-08-24: 0 mg via ORAL
  Administered 2015-08-25 – 2015-08-27 (×5): 100 mg via ORAL
  Filled 2015-08-24 (×7): qty 1

## 2015-08-24 NOTE — ED Nurses Note (Signed)
Pt brought to room via wheel chair. Pt placed in a gown and positioned on stretcher for comfort. Pt attached to cardiac monitor, BP cuff, and pulse ox for continuous monitoring. Bed in low position, side rail up times one, call bell within reach.       I introduced myself and explained my role to the pt. I verified with the pts name and date of birth. I educated the pt on testing and updated them on their status and next step to be taken. Pt is resting comfortably in stretcher in no signs of distress with call bell in reach. Upon leaving the room I asked if there was anything else I could do for them.

## 2015-08-24 NOTE — ED Nurses Note (Signed)
Updated pt regarding room assignment.

## 2015-08-24 NOTE — ED Triage Notes (Signed)
Chest pain with SOB since 0600 today.

## 2015-08-24 NOTE — Care Plan (Signed)
Problem: General Plan of Care(Adult,OB)  Goal: Plan of Care Review(Adult,OB)  The patient and/or their representative will communicate an understanding of their plan of care   Outcome: Ongoing (see interventions/notes)    08/24/15 1130   Coping/Psychosocial   Plan Of Care Reviewed With patient      Oriented to room and surroundings.  Patient able to make needs known. Ambulates independently.  Patient bed in low position, call bell within reach. Will continue to monitor.     Problem: Pain, Acute (Adult)  Goal: Identify Related Risk Factors and Signs and Symptoms  Related risk factors and signs and symptoms are identified upon initiation of Human Response Clinical Practice Guideline (CPG)   Outcome: Ongoing (see interventions/notes)    08/24/15 1420   Pain, Acute   Related Risk Factors (Acute Pain) persistent pain;other (see comments)  (admitted for chest pain )   Patient denies any chest pain, dizziness, headache or shortness of breath at this time.   Will continue to monitor.   Continues with 1/2 in nitro paste right shoulder that was placed in the ED.  BP elevated on admission 170's systolic- per Dr. Ramiro Harvest give labetalol as ordered. Will continue to monitor.

## 2015-08-24 NOTE — ED Nurses Note (Signed)
Attempted to call report. Receiving RN is transporting a pt OTF.

## 2015-08-24 NOTE — ED Provider Notes (Signed)
Maurice Small, MD  [facility]  Emergency Department Visit Note    Date:  08/24/2015  Primary care provider:  Sid Falcon, MD  Means of arrival:  private car  History obtained from: patient  History limited by: none    Chief Complaint:  Chest pain    HISTORY OF PRESENT ILLNESS     [name], date of birth [birth year], is a 65 y.o. female who presents to the Emergency Department complaining of chest pain. The patient complains of chest pain. This episode of pain began at 6:00 AM this morning when she was in bed. She describes left sided pain, sharp in quality -- however, the patient notes that the pain has subsided at the time of the initial visit. She had some mild shortness of breath with this. She denies nausea or radiation of the pain. She treated herself with 81 mg of Aspirin prior to arrival.     The patient has been having this pain intermittently over the past weeks, and informed her PCP, Dr. Tharon Aquas, about this during a visit last week. Dr. Tharon Aquas told her to present here should the pain present again.The patient has a history of known coronary disease and hypertension.     REVIEW OF SYSTEMS     The pertinent positive and negative symptoms are as per HPI. All other systems reviewed and are negative.     PATIENT HISTORY     Past Medical History:  Past Medical History:   Diagnosis Date    Coronary artery disease     HTN (hypertension)     Hyperlipemia     Mixed hyperlipidemia 07/30/2013    Obesity     Wears glasses      Past Surgical History:  Past Surgical History:   Procedure Laterality Date    Coronary artery angioplasty      Hx cervical diskectomy      Hx cervical spine surgery      Hx heart catheterization      Hx hysterectomy      Hx laminoplasty      Hx tmj arthotomy  x2     Family History:  Family History   Problem Relation Age of Onset    Hypertension Sister     Diabetes Sister     Hypertension Maternal Grandmother     Coronary Artery Disease Maternal  Grandmother      elderly    Coronary Artery Disease Maternal Uncle      elderly     Social History:  Social History   Substance Use Topics    Smoking status: Never Smoker    Smokeless tobacco: Never Used    Alcohol use No     History   Drug Use No     Medications:  Outpatient Prescriptions Marked as Taking for the 08/24/15 encounter [facility] Encounter)   Medication Sig    buPROPion (WELLBUTRIN SR) 150 mg Oral Tablet Sustained Release Take 150 mg by mouth Twice daily    CHILDREN'S ASPIRIN 81 mg Oral Tablet, Chewable TAKE 1 TABLET DAILY    clopidogrel (PLAVIX) 75 mg Oral Tablet Take 1 Tab (75 mg total) by mouth Once a day .Follow Blood work with PCP.    CRESTOR 20 mg Oral Tablet TAKE 1 TABLET EVERY EVENING    labetalol (NORMODYNE) 100 mg Oral Tablet Take 1 Tab (100 mg total) by mouth Every 12 hours     Allergies:  Allergies   Allergen Reactions  Capoten [Captopril]     Demerol [Meperidine]     Dilaudid [Hydromorphone]     Tramadol Diarrhea     PHYSICAL EXAM     Vitals:  Filed Vitals:    08/24/15 0845   BP: (!) 141/84   Pulse: (!) 108   Resp: 16   Temp: 36.9 C (98.4 F)   SpO2: 98%     Pulse ox  98% on None (Room Air) interpreted by me as: Normal    Physical Exam:   General: No apparent acute distress. Very pleasant.   Eyes: Conjunctiva are clear. Pupils are equal, round, and reactive to light and accommodation bilaterally.  HENT: Mucous membranes are moist. Nares are clear. Posterior oropharynx is clear without erythema.  Neck: Supple. No meningeal signs.  Lungs: Slightly coarse bilateral bases, otherwise clear to auscultation bilaterally. Good air movement.   Cardiovascular: Normal rate and regular rhythm. No murmurs, rubs or gallops.  Abdomen: Soft. Non-tender. No rebound, guarding, or peritoneal signs.   Extremities: Atraumatic. No cyanosis. No significant peripheral edema. Non-tender calves bilaterally.  Skin: Warm and dry.  Neurologic: Strength 5/5 in all major muscle groups. Sensation normal  throughout. Cranial nerve exam within normal limits.   Psychiatric: Alert and oriented x 3. Affect within normal limits.    DIAGNOSTIC STUDIES     Labs:    COMPREHENSIVE METABOLIC PROFILE - [facility] ONLY   Result Value Ref Range    SODIUM 140 136 - 145 mmol/L    POTASSIUM 2.9 (L) 3.4 - 5.1 mmol/L    CHLORIDE 104 101 - 111 mmol/L    CO2 TOTAL 30 22 - 32 mmol/L    ANION GAP 6 3 - 11 mmol/L    BUN 17 6 - 20 mg/dL    CREATININE 1.61 (H) 0.44 - 1.00 mg/dL    BUN/CREA RATIO 17 6 - 22    ESTIMATED GFR >60 >60 mL/min/1.59m2    ALBUMIN 4.0 3.5 - 5.0 g/dL    CALCIUM 9.3 8.8 - 09.6 mg/dL    GLUCOSE 045 70 - 409 mg/dL    ALKALINE PHOSPHATASE 73 38 - 126 U/L    ALT (SGPT) 13 (L) 14 - 54 U/L    AST (SGOT) 19 15 - 41 U/L    BILIRUBIN TOTAL 1.0 0.3 - 1.2 mg/dL    PROTEIN TOTAL 6.2 (L) 6.4 - 8.3 g/dL    ALBUMIN/GLOBULIN RATIO 1.8 0.8 - 2.0   TROPONIN-I   Result Value Ref Range    TROPONIN I <0.03 <=0.06 ng/mL   CBC WITH DIFF   Result Value Ref Range    WBC 7.0 4.0 - 11.0 x103/uL    RBC 3.40 (L) 4.00 - 5.10 x106/uL    HGB 9.8 (L) 12.0 - 15.5 g/dL    HCT 81.1 (L) 91.4 - 45.0 %    MCV 88.4 82.0 - 97.0 fL    MCH 28.8 27.5 - 33.2 pg    MCHC 32.6 32.0 - 36.0 g/dL    RDW 78.2 (H) 95.6 - 16.0 %    PLATELETS 377 150 - 450 x103/uL    MPV 7.7 7.4 - 10.5 fL    NEUTROPHIL % 68 43 - 76 %    LYMPHOCYTE % 21 15 - 43 %    MONOCYTE % 7 5 - 12 %    EOSINOPHIL % 3 0 - 5 %    BASOPHIL % 1 0 - 3 %    NEUTROPHIL # 4.80 1.50 - 6.50 x103/uL  LYMPHOCYTE # 1.40 1.00 - 4.80 x103/uL    MONOCYTE # 0.50 0.20 - 0.90 x103/uL    EOSINOPHIL # 0.20 0.00 - 0.50 x103/uL    BASOPHIL # 0.10 0.00 - 0.10 x103/uL   PT/INR   Result Value Ref Range    PROTHROMBIN TIME 12.5 9.4 - 12.5 seconds    INR 1.13    B-Type Natriuretic Peptide (BNP)   Result Value Ref Range    BNP 36 0 - 100 pg/mL     Labs reviewed and interpreted by me.    Radiology:    XR CHEST AP PORTABLE: 1-view. Cardiomegaly.     Radiological imaging interpreted by radiologist and independently reviewed by  me.    EKG:  12 lead EKG interpreted by me shows normal sinus rhythm, rate of 98 bpm, Q-waves in leads III and aVF, no acute ST segment, T wave, or interval changes.     ED PROGRESS NOTE / MEDICAL DECISION MAKING     I have reviewed this patient's current vital signs. In addition I have examined the available pertinent past medical records and the notes by our nursing staff for this visit. Patient had IV access established and was placed on a monitor throughout her stay.    Orders Placed This Encounter    XR CHEST AP PORTABLE (If patient condition warrants)    CBC/DIFF    COMPREHENSIVE METABOLIC PROFILE - [facility] ONLY    TROPONIN-I    CBC WITH DIFF    PT/INR    B-Type Natriuretic Peptide (BNP)    ECG 12-LEAD (Take to provider with a brief history)    INSERT & MAINTAIN PERIPHERAL IV ACCESS    NS flush syringe    aspirin chewable tablet 243 mg    nitroGLYCERIN (NITRO-BID) 2 % topical ointment    potassium chloride per 15mL oral liquid     Prior to initial evaluation, CXR, CBC, CMP, Troponin, PT/INR, and EKG ordered.    9:09 AM Initial evaluation completed at this time. The concern over cardiac etiology to these symptoms was discussed with the patient at length. I will run labwork and imaging here in the ED to evaluate. The patient will be admitted after her ED evaluation has completed here, for further inpatient cardiac workup. The patient is in understanding and in agreement. BNP ordered. The patient was treated with 243 mg Aspirin PO.     9:57 AM I am ordering the patient to be treated with Nitro topical paste, secondary to her symptoms. On review of labs, the patient is hypokalemic. I will treat her with Potassium Chloride PO.     9:58 AM I paged the hospitalist, Dr. Ramiro Harvest, at this time.     10:02 AM I discussed the patient's case and above findings with Dr. Ramiro Harvest [facility]) who has agreed to admit the patient for further evaluation and treatment of her symptoms.     10:03 AM On recheck, I  spoke to the patient regarding her lab and radiology studies here. I informed her that she is hypokalemic, and she will be treated here to rectify this. Her other findings, including her kidney function, are reassuring. Per her request, the patient will be treated with liquid potassium chloride instead of the pill. The hospitalist has agreed to admit the patient, and the patient is in agreement with this. All of her questions have been answered to her satisfaction. The patient is in fair condition at the time of admission.     Pre-Disposition Vitals:  08/24/15 0915 08/24/15 0930 08/24/15 0945 08/24/15 1000   BP: (!) 147/85 138/85 (!) 155/80 (!) 154/83   Pulse: 90 93 88 86   Resp: 14 19 15 15    Temp:       SpO2: 97% 96% 95% 95%       CLINICAL IMPRESSION     Acute chest pain  Concern regarding acute coronary syndrome   Concern regarding acute unstable angina  Coronary artery disease  Acute hypokalemia  Anemia    DISPOSITION/PLAN     Admit -- Dr. Ramiro Harvest (Hospitalist Service)    Condition at Disposition: Fair        SCRIBE ATTESTATION STATEMENT  I Konrad Saha, SCRIBE scribed for Maurice Small, MD on 08/24/2015 at 9:09 AM.    Documentation assistance provided for Maurice Small, MD  by Konrad Saha, SCRIBE. Information recorded by the scribe was done at my direction and has been reviewed and validated by me Herbie Drape, Carlis Stable, MD.

## 2015-08-24 NOTE — Nurses Notes (Signed)
Spoke with Dr. Ramiro Harvest, changes made to home medications. Aware of BP and orders to give labetalol as ordered.  Patient took dose before coming to the ED this AM.

## 2015-08-24 NOTE — ED Nurses Note (Signed)
Blood drawn with IV placement. Tubes labeled at the bedside, pt verified labels. Blood tubed to lab.

## 2015-08-24 NOTE — ED Nurses Note (Signed)
Report called to [facility] on the 5th. Floor. Pt prepped for transfer to room #506. All personal belongings transferred with pt.

## 2015-08-24 NOTE — ED Nurses Note (Signed)
Provider now at the bedside.

## 2015-08-24 NOTE — Nurses Notes (Signed)
Paged Dr. Ramiro Harvest regarding patients elevated BP and changes to home meds.

## 2015-08-24 NOTE — H&P (Signed)
[facility]  [place], [place] [address]    General History and Physical    [place]  Date of Admission:  08/24/2015  Date of Birth:  [birth year]    PCP: Sid Falcon, MD  Chief Complaint:  Chest pain    HPI: [name] is a 65 y.o., Black/African American female who presents with Chest pain that started around 6:00 this morning, sharp in nature on the left side of the chest, associated with some shortness of breath and diaphoresis.  No nausea, no radiation.  No other complaints.  The pain lasted a few minutes, then subsided.   She took aspirin 81 milligrams and decided to come to the Emergency  Department.    She also complains of similar chest pain intermittently over the last few weeks.    No other complaints.        Active Hospital Problems   (*Primary Problem)    Diagnosis    *Chest pain    Essential hypertension    Hypokalemia    Mixed hyperlipidemia    Coronary artery disease     H/O PCI with stent to mid LAD      HTN (hypertension)       Past Medical History:   Diagnosis Date    Coronary artery disease     HTN (hypertension)     Hyperlipemia     Mixed hyperlipidemia 07/30/2013    Obesity     Wears glasses        Past Surgical History:   Procedure Laterality Date    CORONARY ARTERY ANGIOPLASTY      HX CERVICAL DISKECTOMY      HX CERVICAL SPINE SURGERY      HX HEART CATHETERIZATION      stent in June 2014    HX HYSTERECTOMY      HX LAMINOPLASTY      HX TMJ ARTHOTOMY  x2       Medications Prior to Admission     Prescriptions    buPROPion (WELLBUTRIN SR) 150 mg Oral Tablet Sustained Release    Take 150 mg by mouth Once a day     CHILDREN'S ASPIRIN 81 mg Oral Tablet, Chewable    TAKE 1 TABLET DAILY    clopidogrel (PLAVIX) 75 mg Oral Tablet    Take 1 Tab (75 mg total) by mouth Once a day .Follow Blood work with PCP.    CRESTOR 20 mg Oral Tablet    TAKE 1 TABLET EVERY EVENING    cyclobenzaprine (FLEXERIL) 10 mg Oral Tablet    Take 10 mg by mouth Three  times a day    escitalopram oxalate (LEXAPRO) 20 mg Oral Tablet    Take 20 mg by mouth Once a day    labetalol (NORMODYNE) 100 mg Oral Tablet    Take 1 Tab (100 mg total) by mouth Every 12 hours    nitroglycerin (NITROSTAT) 0.3 mg Sublingual Tablet, Sublingual    1 Tab (0.3 mg total) by Sublingual route Every 5 minutes as needed for Chest pain for 3 doses over 15 minutes    calcium acetate (PHOSLO) 667 mg Oral Capsule    Take 2 Caps (1,334 mg total) by mouth Three times daily with meals    citalopram (CELEXA) 20 mg Oral Tablet    Take 20 mg by mouth Once a day    levomilnacipran (FETZIMA) 40 mg Oral Capsule,Sustained Action 24 hr    Take 40 mg by mouth Once a  day          Current Facility-Administered Medications:  acetaminophen (TYLENOL) tablet 650 mg Oral Q6H PRN   aspirin chewable tablet 81 mg 81 mg Oral Daily   [START ON 08/25/2015] buPROPion (WELLBUTRIN SR) sustained release tablet 150 mg Oral NIGHTLY   clopidogrel (PLAVIX) 75 mg tablet 75 mg Oral Daily   cyclobenzaprine (FLEXERIL) tablet 10 mg Oral HS PRN   enoxaparin (LOVENOX) 40 mg/0.4 mL SubQ injection 40 mg Subcutaneous Daily   escitalopram (LEXAPRO) tablet 20 mg Oral Daily   labetalol (NORMODYNE) tablet 100 mg Oral 2x/day   morphine 4 mg/mL injection 4 mg Intravenous Q5 Min PRN   nitroGLYCERIN (NITROSTAT) sublingual tablet 0.4 mg Sublingual Q5 Min PRN   NS flush syringe 10 mL Intravenous Q8H   NS flush syringe 10 mL Intravenous Q8HRS   NS flush syringe 10 mL Intravenous Q1H PRN   pneumococcal 13-valent conjugate vaccine (PREVNAR 13) IM injection 0.5 mL Intramuscular Once   rosuvastatin (CRESTOR) tablet 20 mg Oral QPM       Allergies   Allergen Reactions    Capoten [Captopril]     Demerol [Meperidine]     Dilaudid [Hydromorphone]     Tramadol Diarrhea       Social History   Substance Use Topics    Smoking status: Never Smoker    Smokeless tobacco: Never Used    Alcohol use No       Family History   Problem Relation Age of Onset    Hypertension Sister      Diabetes Sister     Hypertension Maternal Grandmother     Coronary Artery Disease Maternal Grandmother      elderly    Coronary Artery Disease Maternal Uncle      elderly       ROS: Other than ROS in the HPI, all other systems were negative.    DNR Status:  Full Code    EXAM:  Temperature: 36.9 C (98.4 F)  Heart Rate: (!) 101  BP (Non-Invasive): (!) 171/85  Respiratory Rate: 16  SpO2-1: 100 %  Pain Score (Numeric, Faces): 6  General: appears in good health. No distress.   Eyes: Pupils equal and round, reactive to light and accomodation.   HEENT: Head atraumatic and normocephalic   Neck: No JVD or thyromegaly or lymphadenopathy   Lungs: Clear to auscultation bilaterally.   Cardiovascular: regular rate and rhythm, S1, S2 normal, no murmur  Abdomen: Soft, non-tender, Bowel sounds normal, No hepatosplenomegaly   Extremities: extremities normal, atraumatic, no cyanosis or edema   Skin: Skin warm and dry   Neurologic: Grossly normal   Lymphatics: No lymphadenopathy   Psychiatric: Normal affect, behavior,         Labs:    Lab Results for Last 24 Hours:    Results for orders placed or performed during the hospital encounter of 08/24/15 (from the past 24 hour(s))   ECG 12-LEAD (Take to provider with a brief history)   Result Value Ref Range    Ventricular rate 98 BPM    Atrial Rate 98 BPM    PR Interval 180 ms    QRS Duration 90 ms    QT Interval 360 ms    QTC Calculation 459 ms    Calculated P Axis 57 degrees    Calculated R Axis -29 degrees    Calculated T Axis 66 degrees   COMPREHENSIVE METABOLIC PROFILE - [facility] ONLY   Result Value Ref Range  SODIUM 140 136 - 145 mmol/L    POTASSIUM 2.9 (L) 3.4 - 5.1 mmol/L    CHLORIDE 104 101 - 111 mmol/L    CO2 TOTAL 30 22 - 32 mmol/L    ANION GAP 6 3 - 11 mmol/L    BUN 17 6 - 20 mg/dL    CREATININE 1.61 (H) 0.44 - 1.00 mg/dL    BUN/CREA RATIO 17 6 - 22    ESTIMATED GFR >60 >60 mL/min/1.82m2    ALBUMIN 4.0 3.5 - 5.0 g/dL    CALCIUM 9.3 8.8 - 09.6 mg/dL    GLUCOSE 045 70 -  409 mg/dL    ALKALINE PHOSPHATASE 73 38 - 126 U/L    ALT (SGPT) 13 (L) 14 - 54 U/L    AST (SGOT) 19 15 - 41 U/L    BILIRUBIN TOTAL 1.0 0.3 - 1.2 mg/dL    PROTEIN TOTAL 6.2 (L) 6.4 - 8.3 g/dL    ALBUMIN/GLOBULIN RATIO 1.8 0.8 - 2.0   TROPONIN-I   Result Value Ref Range    TROPONIN I <0.03 <=0.06 ng/mL   CBC WITH DIFF   Result Value Ref Range    WBC 7.0 4.0 - 11.0 x103/uL    RBC 3.40 (L) 4.00 - 5.10 x106/uL    HGB 9.8 (L) 12.0 - 15.5 g/dL    HCT 81.1 (L) 91.4 - 45.0 %    MCV 88.4 82.0 - 97.0 fL    MCH 28.8 27.5 - 33.2 pg    MCHC 32.6 32.0 - 36.0 g/dL    RDW 78.2 (H) 95.6 - 16.0 %    PLATELETS 377 150 - 450 x103/uL    MPV 7.7 7.4 - 10.5 fL    NEUTROPHIL % 68 43 - 76 %    LYMPHOCYTE % 21 15 - 43 %    MONOCYTE % 7 5 - 12 %    EOSINOPHIL % 3 0 - 5 %    BASOPHIL % 1 0 - 3 %    NEUTROPHIL # 4.80 1.50 - 6.50 x103/uL    LYMPHOCYTE # 1.40 1.00 - 4.80 x103/uL    MONOCYTE # 0.50 0.20 - 0.90 x103/uL    EOSINOPHIL # 0.20 0.00 - 0.50 x103/uL    BASOPHIL # 0.10 0.00 - 0.10 x103/uL   B-Type Natriuretic Peptide (BNP)   Result Value Ref Range    BNP 36 0 - 100 pg/mL   PT/INR   Result Value Ref Range    PROTHROMBIN TIME 12.5 9.4 - 12.5 seconds    INR 1.13    TROPONIN-I   Result Value Ref Range    TROPONIN I <0.03 <=0.06 ng/mL       Imaging Studies:  -    XR CHEST AP PORTABLE: 1-view. Cardiomegaly.   Radiological imaging interpreted by radiologist and independently reviewed by me.    EKG:  Interpreted by me shows normal sinus rhythm, rate of 98 bpm, Q-waves in leads III and aVF, no acute ST segment, T wave, or interval changes.     DVT RISK FACTORS HAVE BEN ASSESSED AND PROPHYLAXIS ORDERED (SEE RUBYONLINE - REFERENCE TOOLS - MD, DVT PROPHY OR POCKET CARD)    Assessment/Plan:     1.  Chest pain, currently subsided.  She does have history of coronary artery disease and percutaneous coronary intervention in the past.  She is on adequate medical management.  We will rule her out for acute coronary syndrome, watch for arrhythmia.   If she rules in or chest  pain continues, we will consult cardiology.  If she rules out and remains pain free, we will discharge her home.  She may benefit from outpatient stress test.  2.  Coronary artery disease with history of stent in the mid left anterior descending, currently on aspirin, Plavix, Crestor and labetalol.  I will continue that.  3.  History of anxiety/depression.  Continue Wellbutrin and Celexa.  4.  Dyslipidemia.  Continue Crestor.  5.  Chronic intermittent back pain.  Continue Flexeril as needed.      DVT and GI prophylaxis. Lovenox and protonix.  Code status. Full code.        Portions of this note may be dictated using voice recognition software or a dictation service. Variances in spelling and vocabulary are possible and unintentional. Not all errors are caught/corrected. Please notify the Thereasa Parkin if any discrepancies are noted or if the meaning of any statement is not clear.

## 2015-08-25 LAB — BASIC METABOLIC PANEL
ANION GAP: 6 mmol/L (ref 3–11)
BUN/CREA RATIO: 19 (ref 6–22)
BUN: 18 mg/dL (ref 6–20)
CALCIUM: 8.8 mg/dL (ref 8.8–10.2)
CHLORIDE: 103 mmol/L (ref 101–111)
CO2 TOTAL: 32 mmol/L (ref 22–32)
CREATININE: 0.97 mg/dL (ref 0.44–1.00)
ESTIMATED GFR: >60 mL/min/1.73mˆ2 (ref >60)
GLUCOSE: 119 mg/dL — ABNORMAL HIGH (ref 70–110)
POTASSIUM: 3.9 mmol/L (ref 3.4–5.1)
SODIUM: 141 mmol/L (ref 136–145)

## 2015-08-25 LAB — STRESS TEST - EXERCISE
EXERCISE STAGE 1 HR: 103 {beats}/min
EXERCISE STAGE 1 HR: 131 {beats}/min
EXERCISE STAGE 2 HR: 131 {beats}/min
EXERCISE-STAGE 1 Grade: 0 %
EXERCISE-STAGE 1 Grade: 10 %
EXERCISE-STAGE 1 Speed: 1 mph
EXERCISE-STAGE 1 Speed: 1.7 mph
EXERCISE-STAGE 2 Grade: 12 %
EXERCISE-STAGE 2 Speed: 2.5 mph
MAX WORK LOAD: 51
Max Heart Rate: 133 {beats}/min
PRETEST-SUPINE HR: 105 {beats}/min
PRETEST-WARM-UP HR: 103 {beats}/min
Peak Diastolic BP for Stress Tests: 88 mmHg
Peak Systolic BP Stress Test: 140 mmHg
Predicted Max HR: 154 {beats}/min
RECOVERY HR 2 MINS: 129 {beats}/min
RECOVERY HR 4 MINS: 108 {beats}/min

## 2015-08-25 LAB — CBC WITH DIFF
BASOPHIL #: 0 x10ˆ3/uL (ref 0.00–0.10)
BASOPHIL %: 0 % (ref 0–3)
EOSINOPHIL #: 0.2 x10ˆ3/uL (ref 0.00–0.50)
EOSINOPHIL %: 2 % (ref 0–5)
HCT: 28 % — ABNORMAL LOW (ref 36.0–45.0)
HGB: 9.3 g/dL — ABNORMAL LOW (ref 12.0–15.5)
LYMPHOCYTE #: 1.7 x10ˆ3/uL (ref 1.00–4.80)
LYMPHOCYTE %: 16 % (ref 15–43)
MCH: 29.6 pg (ref 27.5–33.2)
MCHC: 33.1 g/dL (ref 32.0–36.0)
MCV: 89.4 fL (ref 82.0–97.0)
MONOCYTE #: 0.8 x10ˆ3/uL (ref 0.20–0.90)
MONOCYTE %: 8 % (ref 5–12)
MPV: 7.9 fL (ref 7.4–10.5)
NEUTROPHIL #: 7.5 x10ˆ3/uL — ABNORMAL HIGH (ref 1.50–6.50)
NEUTROPHIL %: 74 % (ref 43–76)
PLATELETS: 351 x10ˆ3/uL (ref 150–450)
RBC: 3.13 x10ˆ6/uL — ABNORMAL LOW (ref 4.00–5.10)
RDW: 20.7 % — ABNORMAL HIGH (ref 11.0–16.0)
WBC: 10.2 x10ˆ3/uL (ref 4.0–11.0)

## 2015-08-25 LAB — TROPONIN-I: TROPONIN I: <0.03 ng/mL (ref <=0.06)

## 2015-08-25 MED ORDER — KETOROLAC 30 MG/ML (1 ML) INJECTION SOLUTION
15.00 mg | Freq: Four times a day (QID) | INTRAMUSCULAR | Status: DC | PRN
Start: 2015-08-25 — End: 2015-08-27
  Administered 2015-08-25: 15 mg via INTRAVENOUS
  Filled 2015-08-25: qty 1

## 2015-08-25 NOTE — Care Management Notes (Signed)
Observation status info. Packet presented. She and I acknowledged as designated. Copies of the 3 pages to her. Originals placed into the hardback chart.    Returned to room for d/c planning conversation.  Resides in [place] area with her daughter, s-i-l, and 4 grandchildren: 3 teenagers and a 67 yr old ( 3 girls, 1 boy, age 2).  Residence is a split level design. She has 4-5 steps to navigate once in. Lives on the top level.  She is independent.  Drives. Drove self here. Car is in our parking lot.  No DME use, but will use a cane, "walking stick" as she prefers if terrain will be hilly.  PCP: Dr. Tharon Aquas.  Rx: [facility].  No h/o HH.  No h/o SNF  No d/c needs anticipated at this time.

## 2015-08-25 NOTE — Nurses Notes (Signed)
Asked patient about completing stress test outpatient.  Patient states she wants to complete test here.  Dr. Ramiro Harvest made aware

## 2015-08-25 NOTE — Nurses Notes (Signed)
ECG completed, paged Dr. Jonette Pesa with results.

## 2015-08-25 NOTE — Nurses Notes (Signed)
Paged Dr. Ramiro Harvest regarding stress test.

## 2015-08-25 NOTE — Care Plan (Signed)
Problem: Pain, Acute (Adult)  Goal: Identify Related Risk Factors and Signs and Symptoms  Related risk factors and signs and symptoms are identified upon initiation of Human Response Clinical Practice Guideline (CPG)   Outcome: Ongoing (see interventions/notes)    08/25/15 0440   Pain, Acute   Related Risk Factors (Acute Pain) disease process;persistent pain;positioning   Signs and Symptoms (Acute Pain) facial mask of pain/grimace;guarding/abnormal posturing/positioning;moaning;sleep pattern alteration;verbalization of pain descriptors       Goal: Acceptable Pain Control/Comfort Level  Patient will demonstrate the desired outcomes by discharge/transition of care.   Outcome: Ongoing (see interventions/notes)    08/25/15 0440   Pain, Acute (Adult)   Acceptable Pain Control/Comfort Level making progress toward outcome      Patient experienced chest pain she described as stabbing & squeezing "as if I'm wearing a tight bra but I'm not wearing one".  PRN nitro & morphine administered per chest pain order protocol with new EKG captured.  Patient reported relief of chest pain.  Will continue to monitor.

## 2015-08-25 NOTE — Ancillary Notes (Signed)
Treadmill stress test not completed due to patient unable to walk. Repeat with Lexiscan cardiolite per MD.   Paged Dr. Ramiro Harvest and notified RN in charged of the patient and she paged Dr. Ramiro Harvest as well.

## 2015-08-25 NOTE — Progress Notes (Signed)
[facility]  [place], [place] [address]    IP PROGRESS NOTE      [place]  Date of Admission:  08/24/2015  Date of Birth:  [birth year]  Date of Service:  08/25/2015    Chief Complaint: no chest pain since this morning.   Subjective: had 2 episode of chest pain last night.   Needed nitro.   Currently pain free.     Vital Signs:  Temp (24hrs) Max:37.2 C (98.9 F)      Temperature: 36.9 C (98.4 F)  BP (Non-Invasive): 121/68  MAP (Non-Invasive): 81 mmHG  Heart Rate: 93  Respiratory Rate: 18  Pain Score (Numeric, Faces): 0  SpO2-1: 92 %    Current Medications:    Current Facility-Administered Medications:  acetaminophen (TYLENOL) tablet 650 mg Oral Q6H PRN   aspirin chewable tablet 81 mg 81 mg Oral Daily   buPROPion (WELLBUTRIN SR) sustained release tablet 150 mg Oral NIGHTLY   clopidogrel (PLAVIX) 75 mg tablet 75 mg Oral Daily   cyclobenzaprine (FLEXERIL) tablet 10 mg Oral HS PRN   enoxaparin (LOVENOX) 40 mg/0.4 mL SubQ injection 40 mg Subcutaneous Daily   escitalopram (LEXAPRO) tablet 20 mg Oral Daily   ketorolac (TORADOL) 30 mg/mL injection 15 mg Intravenous Q6H PRN   labetalol (NORMODYNE) tablet 100 mg Oral 2x/day   morphine 4 mg/mL injection 4 mg Intravenous Q5 Min PRN   nitroGLYCERIN (NITROSTAT) sublingual tablet 0.4 mg Sublingual Q5 Min PRN   NS flush syringe 10 mL Intravenous Q8HRS   NS flush syringe 10 mL Intravenous Q1H PRN   pneumococcal 13-valent conjugate vaccine (PREVNAR 13) IM injection 0.5 mL Intramuscular Once   rosuvastatin (CRESTOR) tablet 20 mg Oral QPM       Today's Physical Exam:  General: appears in good health. No distress.   Eyes: Pupils equal and round, reactive to light and accomodation.   HENT:Head atraumatic and normocephalic   Neck: No JVD or thyromegaly or lymphadenopathy   Lungs: CTAB, non labored breathing, no rales or wheezing.    Cardiovascular: regular rate and rhythm, S1, S2 normal, no murmur,   Abdomen: Soft, non-tender, Bowel sounds normal, No  hepatosplenomegaly   Extremities: extremities normal, atraumatic, no cyanosis or edema   Skin: Skin warm and dry   Neurologic: Grossly normal   Psychiatric: anxious.          I/O:  I/O last 24 hours:    Intake/Output Summary (Last 24 hours) at 08/25/15 1703  Last data filed at 08/25/15 1200   Gross per 24 hour   Intake              240 ml   Output                0 ml   Net              240 ml     I/O current shift:         Labs  Please indicate ordered or reviewed)  Reviewed:   Lab Results for Last 24 Hours:    Results for orders placed or performed during the hospital encounter of 08/24/15 (from the past 24 hour(s))   TROPONIN-I   Result Value Ref Range    TROPONIN I <0.03 <=0.06 ng/mL   ECG 12-LEAD   Result Value Ref Range    Ventricular rate 87 BPM    Atrial Rate 87 BPM    PR Interval 186 ms    QRS Duration  88 ms    QT Interval 412 ms    QTC Calculation 495 ms    Calculated P Axis 58 degrees    Calculated R Axis -8 degrees    Calculated T Axis 48 degrees   BASIC METABOLIC PANEL   Result Value Ref Range    SODIUM 141 136 - 145 mmol/L    POTASSIUM 3.9 3.4 - 5.1 mmol/L    CHLORIDE 103 101 - 111 mmol/L    CO2 TOTAL 32 22 - 32 mmol/L    ANION GAP 6 3 - 11 mmol/L    CALCIUM 8.8 8.8 - 10.2 mg/dL    GLUCOSE 578 (H) 70 - 110 mg/dL    BUN 18 6 - 20 mg/dL    CREATININE 4.69 6.29 - 1.00 mg/dL    BUN/CREA RATIO 19 6 - 22    ESTIMATED GFR >60 >60 mL/min/1.47m2   CBC WITH DIFF   Result Value Ref Range    WBC 10.2 4.0 - 11.0 x103/uL    RBC 3.13 (L) 4.00 - 5.10 x106/uL    HGB 9.3 (L) 12.0 - 15.5 g/dL    HCT 52.8 (L) 41.3 - 45.0 %    MCV 89.4 82.0 - 97.0 fL    MCH 29.6 27.5 - 33.2 pg    MCHC 33.1 32.0 - 36.0 g/dL    RDW 24.4 (H) 01.0 - 16.0 %    PLATELETS 351 150 - 450 x103/uL    MPV 7.9 7.4 - 10.5 fL    NEUTROPHIL % 74 43 - 76 %    LYMPHOCYTE % 16 15 - 43 %    MONOCYTE % 8 5 - 12 %    EOSINOPHIL % 2 0 - 5 %    BASOPHIL % 0 0 - 3 %    NEUTROPHIL # 7.50 (H) 1.50 - 6.50 x103/uL    LYMPHOCYTE # 1.70 1.00 - 4.80 x103/uL     MONOCYTE # 0.80 0.20 - 0.90 x103/uL    EOSINOPHIL # 0.20 0.00 - 0.50 x103/uL    BASOPHIL # 0.00 0.00 - 0.10 x103/uL   TROPONIN-I   Result Value Ref Range    TROPONIN I <0.03 <=0.06 ng/mL   STRESS TEST - EXERCISE   Result Value Ref Range    Protocol BRUCE     MAX WORK LOAD 51     Exercise duration (min) 00:03:14     Peak Systolic BP Stress Test 140 mmHg    Peak Diastolic BP for Stress Tests 88 mmHg    Max Heart Rate 133 BPM    Predicted Max HR 154 BPM    CVIS DUKE TREADMILL SCORE       CVIS DUKE TM SCORE RESULT Moderate Risk     PRETEST-WARM-UP HR 103 bpm    PRETEST-SUPINE HR 105 bpm    PRETEST-SUPINE BP 128/82 mmHg    EXERCISE STAGE 1 HR 103 bpm    EXERCISE STAGE 1 HR 131 bpm    EXERCISE STAGE 1 BP 132/84 mmHg    EXERCISE-STAGE 1 Speed 1.0 mph    EXERCISE-STAGE 1 Speed 1.7 mph    EXERCISE-STAGE 1 Grade 0.0 %    EXERCISE-STAGE 1 Grade 10.0 %    EXERCISE STAGE 2 HR 131 bpm    EXERCISE-STAGE 2 Speed 2.5 mph    EXERCISE-STAGE 2 Grade 12.0 %    RECOVERY HR 2 MINS 129 bpm    RECOVERY HR 4 MINS 108 bpm    RECOVERY BP 4 MINS 140/88 mmHg  Radiology Tests (Please indicate ordered or reviewed)  Reviewed: N/A      Problem List:  Active Hospital Problems   (*Primary Problem)    Diagnosis    *Chest pain    Essential hypertension    Hypokalemia    Mixed hyperlipidemia    Coronary artery disease     H/O PCI with stent to mid LAD      HTN (hypertension)       Assessment/ Plan:     1. Chest pain. Currently resolved. She could not do exercise stress test this am. I offered outpatient stress test- especially all troponins are negative, EKG shows no acute changes and she is pain free. She has 2 episode of chest pain last night and very concerned. Wants pharmacological stress test tomorrow. Ordered.    2. Coronary artery disease with history of stent in the mid left anterior descending, currently on aspirin, Plavix, Crestor and labetalol.   3. History of anxiety/depression. Continue Wellbutrin and Celexa.  4. Dyslipidemia.  Continue Crestor.  5. Chronic intermittent back pain. Continue Flexeril as needed.    DVT and GI prophylaxis. Lovenox and protonix.  Code status. Full code.  Stress test tomorrow am if insurance authorization happens.

## 2015-08-25 NOTE — Nurses Notes (Signed)
Made him aware of pt's chest pain last night.   Dr Ramiro Harvest states that pt will go for stress test this AM.

## 2015-08-25 NOTE — Care Management Notes (Signed)
Notified patient that her stress test will take up to 72 hours for [employer] to approve.  She was informed if we do not have an answer from [facility] by am August 26, 2015 she would receive an ABN at that time.  This will then indicate that she would be self pay.  When I asked about the stress test this date she informed me it was moving too fast for her to walk.  I explained that with normal limits on certain cardiac test I couldn't guarantee the approval from [facility] for the study.

## 2015-08-25 NOTE — Nurses Notes (Signed)
Patient reassessed, patient reports no change to squeezing quality of chest pain.  PRN MS to be administered.

## 2015-08-25 NOTE — Nurses Notes (Signed)
Dr. Jonette Pesa paged to make him aware of pt's chest pain.

## 2015-08-25 NOTE — Nurses Notes (Signed)
Paged Dr. Ramiro Harvest regarding patient's stress test.  Patient unable to complete test due to being tired.  Recc. By Dr. Burman Foster to repeat with lexiscan cardiolite since patient did not tolerate. Awaiting a call back.

## 2015-08-25 NOTE — Nurses Notes (Signed)
Dr. Kingree returned page, new orders received.

## 2015-08-25 NOTE — Nurses Notes (Signed)
Pt c/o chest pain, stabbing & squeezing in quality to L chest under breast, no radiation.  Administered PRN Nitro, pt reported relief from stabbing quality; squeezing continues.

## 2015-08-25 NOTE — Care Management Notes (Signed)
08/25/15 2000   Assessment Detail   Assessment Type Admission   Social Work Plan   Discharge Planning Status initial meeting   Projected Discharge Date 08/26/15   Anticipated Discharge Disposition Home   Discharge Needs Assessment   Concerns To Be Addressed no discharge needs identified;denies needs/concerns at this time   Equipment Currently Used at Home (see narrative)   Transportation Available (yes)   Referral Information   Admission Type observation   Source of Information Patient   Referral Source admission list   Reason For Consult discharge planning   Record Reviewed medical record   Current Health   Services Anticipated at Discharge none

## 2015-08-25 NOTE — Nurses Notes (Signed)
Patient denies chest pain so far this shift.  Will continue to monitor.

## 2015-08-26 ENCOUNTER — Observation Stay (HOSPITAL_BASED_OUTPATIENT_CLINIC_OR_DEPARTMENT_OTHER): Payer: Commercial Managed Care - PPO

## 2015-08-26 ENCOUNTER — Encounter (HOSPITAL_BASED_OUTPATIENT_CLINIC_OR_DEPARTMENT_OTHER): Payer: Self-pay

## 2015-08-26 DIAGNOSIS — R079 Chest pain, unspecified: Secondary | ICD-10-CM

## 2015-08-26 DIAGNOSIS — R0602 Shortness of breath: Secondary | ICD-10-CM

## 2015-08-26 MED ADMIN — sodium chloride 0.9 % intravenous solution: SUBCUTANEOUS | @ 16:00:00

## 2015-08-26 MED ADMIN — potassium chloride 20 mEq/L in 0.9 % sodium chloride intravenous: ORAL | @ 16:00:00 | NDC 00338069104

## 2015-08-26 MED ADMIN — sodium chloride 0.9 % intravenous solution: INTRAVENOUS | @ 07:00:00 | NDC 00338004904

## 2015-08-26 NOTE — Care Plan (Signed)
Problem: Pain, Acute (Adult)  Goal: Acceptable Pain Control/Comfort Level  Patient will demonstrate the desired outcomes by discharge/transition of care.   Outcome: Ongoing (see interventions/notes)    08/26/15 2150   Pain, Acute (Adult)   Acceptable Pain Control/Comfort Level making progress toward outcome         Comments:   Patient states no pain at this time.

## 2015-08-26 NOTE — Care Plan (Signed)
Problem: Pain, Acute (Adult)  Goal: Identify Related Risk Factors and Signs and Symptoms  Related risk factors and signs and symptoms are identified upon initiation of Human Response Clinical Practice Guideline (CPG)   Outcome: Ongoing (see interventions/notes)    08/26/15 2149   Pain, Acute   Related Risk Factors (Acute Pain) disease process         Comments:   Patient states no chest pains at this time. I instructed her to use her call light if she has any pain at all anywhere.

## 2015-08-26 NOTE — Progress Notes (Signed)
Part 1 of Nuclear Stress Test Complete, will continue Part 2 tomorrow 3/3. NPO after midnight and no caffeine.     [id number]  [facility]

## 2015-08-26 NOTE — Care Plan (Signed)
Problem: Pain, Acute (Adult)  Goal: Identify Related Risk Factors and Signs and Symptoms  Related risk factors and signs and symptoms are identified upon initiation of Human Response Clinical Practice Guideline (CPG)   Outcome: Ongoing (see interventions/notes)    08/26/15 0428   Pain, Acute   Related Risk Factors (Acute Pain) disease process;fear;positioning   Signs and Symptoms (Acute Pain) BADLs/IADLs reluctance/inability to perform;constipation/diarrhea;fear of reinjury       Goal: Acceptable Pain Control/Comfort Level  Patient will demonstrate the desired outcomes by discharge/transition of care.   Outcome: Ongoing (see interventions/notes)    08/26/15 0428   Pain, Acute (Adult)   Acceptable Pain Control/Comfort Level making progress toward outcome         Patient continues to be monitored for any new chest pain, no chest pain experienced by patient during shift.  Patient to have chemical stress test in AM.  PRN pain medication orders active if needed.  Will continue to monitor.

## 2015-08-26 NOTE — Care Plan (Signed)
Problem: Pain, Acute (Adult)  Intervention: Mutually Develop/Implement Acute Pain Management Plan    08/26/15 2152   OTHER   Pain Intervention  Medication (see eMAR);Repositioned;Distraction;Relaxation Technique;Rest;Ambulation/Increased Activity   Cognitive Interventions   Sensory Stimulation Regulation care clustered;lighting decreased;quiet environment promoted;music/television provided for relaxation           Comments:   I clustered patient's care and decreased lighting in the room to regulate sensory stimulation.

## 2015-08-26 NOTE — Care Plan (Signed)
Problem: Pain, Acute (Adult)  Intervention: Monitor/Manage Analgesia    08/26/15 2150   Manage Acute Burn Pain   Bowel Intervention ambulation promoted;adequate fluid intake promoted;privacy promoted   Safety Interventions   Medication Review/Management medications reviewed   OTHER   Pain Intervention  Repositioned;Relaxation Technique;Rest;Distraction;Medication (see eMAR)           Comments:   I instructed patient on medications and she verbalized an understanding. I instructed patient to use distraction methods and she does she is watching television and talking on her phone.

## 2015-08-27 ENCOUNTER — Observation Stay (HOSPITAL_BASED_OUTPATIENT_CLINIC_OR_DEPARTMENT_OTHER): Payer: Commercial Managed Care - PPO

## 2015-08-27 MED ORDER — SODIUM CHLORIDE 0.9 % (FLUSH) INJECTION SYRINGE
20.0000 mL | INJECTION | INTRAMUSCULAR | Status: DC
Start: 2015-08-27 — End: 2015-08-27
  Administered 2015-08-27: 20 mL via INTRAVENOUS

## 2015-08-27 MED ORDER — REGADENOSON 0.4 MG/5 ML INTRAVENOUS SYRINGE
0.4000 mg | INJECTION | INTRAVENOUS | Status: AC
Start: 2015-08-27 — End: 2015-08-27
  Administered 2015-08-27: 0.4 mg via INTRAVENOUS

## 2015-08-27 NOTE — Care Plan (Signed)
Problem: General Plan of Care(Adult,OB)  Goal: Plan of Care Review(Adult,OB)  The patient and/or their representative will communicate an understanding of their plan of care   Outcome: Ongoing (see interventions/notes)    08/27/15 1502   Coping/Psychosocial   Plan Of Care Reviewed With patient         Problem: Pain, Acute (Adult)  Goal: Acceptable Pain Control/Comfort Level  Patient will demonstrate the desired outcomes by discharge/transition of care.   Outcome: Ongoing (see interventions/notes)    08/27/15 1502   Pain, Acute (Adult)   Acceptable Pain Control/Comfort Level making progress toward outcome         Comments:   Plan of care reviewed with patient and patient verbalizes understanding. Patient has no complaints of pain or discomfort at this time.  Call bell within reach. Will continue to monitor.

## 2015-08-27 NOTE — Nurses Notes (Signed)
Patient discharged home.  AVS reviewed with patient.  A written copy of the AVS and discharge instructions was given to the patient.  Questions sufficiently answered as needed.  Patient encouraged to follow up with PCP as indicated.  In the event of an emergency, patient instructed to call 911 or go to the nearest emergency room.

## 2015-08-27 NOTE — Progress Notes (Signed)
[facility]  [facility]  [place] [id number]    IP PROGRESS NOTE      [place] [name],[name]  Date of Admission:  08/24/2015  Date of Birth:  Dec 28, 1950  Date of Service:  08/27/2015    Chief Complaint: no chest pain.    Subjective: no SOB, palpitation.   No other complaints.     Vital Signs:  Temp (24hrs) Max:37.1 C (98.7 F)      Temperature: 37.1 C (98.7 F)  BP (Non-Invasive): 130/67  MAP (Non-Invasive): 83 mmHG  Heart Rate: 84  Respiratory Rate: 18  Pain Score (Numeric, Faces): 0  SpO2-1: 93 %    Current Medications:    No current facility-administered medications for this encounter.     Today's Physical Exam:  General: appears in good health. No distress.   Eyes: Pupils equal and round, reactive to light and accomodation.   HENT:Head atraumatic and normocephalic   Neck: No JVD or thyromegaly or lymphadenopathy   Lungs: CTAB, non labored breathing, no rales or wheezing.    Cardiovascular: regular rate and rhythm, S1, S2 normal, no murmur,   Abdomen: Soft, non-tender, Bowel sounds normal, No hepatosplenomegaly   Extremities: extremities normal, atraumatic, no cyanosis or edema   Skin: Skin warm and dry   Neurologic: Grossly normal   Psychiatric: anxious.          I/O:  I/O last 24 hours:      Intake/Output Summary (Last 24 hours) at 08/27/15 2324  Last data filed at 08/27/15 1204   Gross per 24 hour   Intake              500 ml   Output                0 ml   Net              500 ml     I/O current shift:         Labs  Please indicate ordered or reviewed)  Reviewed:   Lab Results for Last 24 Hours:    No results found for any visits on 08/24/15 (from the past 24 hour(s)).    Radiology Tests (Please indicate ordered or reviewed)  Reviewed: N/A      Problem List:  Active Hospital Problems   (*Primary Problem)    Diagnosis    *Chest pain    Essential hypertension    Hypokalemia    Mixed hyperlipidemia    Coronary artery disease     H/O PCI with stent to mid LAD      HTN (hypertension)              Assessment/ Plan:     1. Chest pain. Currently resolved. First part of lexiscan today. Second part tomorrow.     2. CAD with history of PCI mid left anterior descending, currently on aspirin, Plavix, Crestor and labetalol.   3. History of anxiety/depression. Continue Wellbutrin and Celexa.  4. Dyslipidemia. Continue Crestor.  5. Chronic intermittent back pain. Continue Flexeril as needed.    DVT and GI prophylaxis. Lovenox and protonix.  Code status. Full code.  Stress test tomorrow am.  D/c home if stress is normal.

## 2015-08-27 NOTE — Discharge Summary (Signed)
[facility]  [place], [place] [address]    DISCHARGE SUMMARY      PATIENT NAME:  [name], [name]  MRN:  [id number]  DOB:  [birth year]    ADMISSION DATE:  08/24/2015  DISCHARGE DATE:  08/27/2015    ATTENDING PHYSICIAN: Rolly Salter, MD  PRIMARY CARE PHYSICIAN: Sid Falcon, MD     ADMISSION DIAGNOSIS: Chest pain  DISCHARGE DIAGNOSIS:   Active Hospital Problems    Diagnosis Date Noted    Principle Problem: Chest pain 08/24/2015    Essential hypertension 04/16/2014    Hypokalemia 04/16/2014    Mixed hyperlipidemia 07/30/2013    Coronary artery disease     HTN (hypertension)       Resolved Hospital Problems    Diagnosis    No resolved problems to display.     There are no active non-hospital problems to display for this patient.     DISCHARGE MEDICATIONS:     Current Discharge Medication List      CONTINUE these medications - NO CHANGES were made during your visit.       Details    buPROPion 150 mg Tablet Sustained Release   Commonly known as:  WELLBUTRIN SR    150 mg, Oral, Daily   Refills:  0       CHILDREN'S ASPIRIN 81 mg Tablet, Chewable   Generic drug:  aspirin    TAKE 1 TABLET DAILY   Qty:  90 Tab   Refills:  4       clopidogrel 75 mg Tablet   Commonly known as:  PLAVIX    75 mg, Oral, Daily, .Follow Blood work with PCP.   Qty:  90 Tab   Refills:  3       CRESTOR 20 mg Tablet   Generic drug:  rosuvastatin    TAKE 1 TABLET EVERY EVENING   Qty:  90 Tab   Refills:  2       cyclobenzaprine 10 mg Tablet   Commonly known as:  FLEXERIL    10 mg, 3x/day   Refills:  0       escitalopram oxalate 20 mg Tablet   Commonly known as:  LEXAPRO    20 mg, Oral, Daily   Refills:  0       labetalol 100 mg Tablet   Commonly known as:  NORMODYNE    100 mg, Oral, Q12H   Qty:  60 Tab   Refills:  0       nitroGLYCERIN 0.3 mg Tablet, Sublingual   Commonly known as:  NITROSTAT    0.3 mg, Sublingual, Q5 Min PRN, for 3 doses over 15 minutes   Qty:  25 Tab   Refills:  1           DISCHARGE INSTRUCTIONS:        DISCHARGE INSTRUCTION - DIET   Diet: CARDIAC DIET      DISCHARGE INSTRUCTION - ACTIVITY   Activity: MAY RESUME PREVIOUS ACTIVITY      ASPIRIN ALREADY ORDERED       REASON FOR HOSPITALIZATION AND HOSPITAL COURSE:  This is a 65 y.o., female came to [place] with chest pain. She is ruled out for ACS. Stress test is normal. She is pain free.   D/c home.       Physical exam :  GENERAL: The patient is alert and oriented to time, place and person.   HEENT: Normocephalic, anicteric. No pallor. PERRL.   NECK: Supple. No jugular  venous distention.   LUNGS: clear breath sounds are audible bilaterally. Good bilateral air entry.  HEART: Both first and second sounds are audible, regular sinus rhythm. No murmur.  ABDOMEN: Soft, bowel sounds are present, nontender, no hepatosplenomegaly.   EXTREMITIES: No edema. Peripheral pulses are present.   CENTRAL NERVOUS SYSTEM: No focal deficit, no cranial nerve palsy.   SKIN: No rashes or bruises.   MUSCULOSKELETAL SYSTEM: No deformity or swelling.   PSYCHIATRIC: No anxiety or depression.   HEMATOLOGIC: No ecchymosis, petechia or hematoma.      COURSE IN HOSPITAL: As above.  Please see my H&P for admission details.    1. Chest pain. Currently resolved. lexiscan is normal.    2. CAD with h/o PCI mid LAD. currently on aspirin, Plavix, Crestor and labetalol.   3. History of anxiety/depression. Continue Wellbutrin and Celexa.  4. Dyslipidemia. Continue Crestor.  5. Chronic intermittent back pain. Continue Flexeril as needed.      CONDITION ON DISCHARGE: Alert, Oriented and VS Stable    DISCHARGE DISPOSITION:  Home discharge     cc: Primary Care Physician:  Sid FalconJustin Glassford, MD  [facility] [address]  [place] [id number]     UE:[id number]:Referring Physician:  No referring provider defined for this encounter.

## 2015-09-06 LAB — ECG 12-LEAD
Atrial Rate: 98 {beats}/min
Atrial Rate: 87 {beats}/min
Calculated P Axis: 57 degrees
Calculated P Axis: 58 degrees
Calculated R Axis: -29 degrees
Calculated R Axis: -8 degrees
Calculated T Axis: 66 degrees
Calculated T Axis: 48 degrees
PR Interval: 180 ms
PR Interval: 186 ms
QRS Duration: 90 ms
QRS Duration: 88 ms
QT Interval: 360 ms
QT Interval: 412 ms
QTC Calculation: 459 ms
QTC Calculation: 495 ms
Ventricular rate: 98 {beats}/min
Ventricular rate: 87 {beats}/min

## 2015-09-25 ENCOUNTER — Emergency Department (HOSPITAL_BASED_OUTPATIENT_CLINIC_OR_DEPARTMENT_OTHER): Payer: Commercial Managed Care - PPO

## 2015-09-25 ENCOUNTER — Encounter (HOSPITAL_BASED_OUTPATIENT_CLINIC_OR_DEPARTMENT_OTHER): Payer: Self-pay

## 2015-09-25 ENCOUNTER — Inpatient Hospital Stay (HOSPITAL_BASED_OUTPATIENT_CLINIC_OR_DEPARTMENT_OTHER): Payer: Commercial Managed Care - PPO

## 2015-09-25 ENCOUNTER — Inpatient Hospital Stay (HOSPITAL_COMMUNITY): Admission: EM | Admit: 2015-09-25 | Payer: Self-pay

## 2015-09-25 ENCOUNTER — Inpatient Hospital Stay (HOSPITAL_BASED_OUTPATIENT_CLINIC_OR_DEPARTMENT_OTHER)
Admission: EM | Admit: 2015-09-25 | Discharge: 2015-09-25 | DRG: 125 | Disposition: A | Discharge status: Other Acute Inpatient Hospital | Payer: Commercial Managed Care - PPO | Attending: Internal Medicine | Admitting: Internal Medicine

## 2015-09-25 DIAGNOSIS — F329 Major depressive disorder, single episode, unspecified: Secondary | ICD-10-CM | POA: Diagnosis present

## 2015-09-25 DIAGNOSIS — I2510 Atherosclerotic heart disease of native coronary artery without angina pectoris: Secondary | ICD-10-CM | POA: Diagnosis present

## 2015-09-25 DIAGNOSIS — Z888 Allergy status to other drugs, medicaments and biological substances status: Secondary | ICD-10-CM

## 2015-09-25 DIAGNOSIS — Z7982 Long term (current) use of aspirin: Secondary | ICD-10-CM

## 2015-09-25 DIAGNOSIS — Z886 Allergy status to analgesic agent status: Secondary | ICD-10-CM

## 2015-09-25 DIAGNOSIS — Z6831 Body mass index (BMI) 31.0-31.9, adult: Secondary | ICD-10-CM

## 2015-09-25 DIAGNOSIS — Z8249 Family history of ischemic heart disease and other diseases of the circulatory system: Secondary | ICD-10-CM

## 2015-09-25 DIAGNOSIS — S0011XA Contusion of right eyelid and periocular area, initial encounter: Principal | ICD-10-CM | POA: Diagnosis present

## 2015-09-25 DIAGNOSIS — E782 Mixed hyperlipidemia: Secondary | ICD-10-CM | POA: Diagnosis present

## 2015-09-25 DIAGNOSIS — I160 Hypertensive urgency: Secondary | ICD-10-CM | POA: Diagnosis present

## 2015-09-25 DIAGNOSIS — Z833 Family history of diabetes mellitus: Secondary | ICD-10-CM

## 2015-09-25 DIAGNOSIS — E669 Obesity, unspecified: Secondary | ICD-10-CM | POA: Diagnosis present

## 2015-09-25 DIAGNOSIS — W109XXA Fall (on) (from) unspecified stairs and steps, initial encounter: Secondary | ICD-10-CM | POA: Diagnosis present

## 2015-09-25 DIAGNOSIS — T148XXA Other injury of unspecified body region, initial encounter: Secondary | ICD-10-CM

## 2015-09-25 DIAGNOSIS — Z7902 Long term (current) use of antithrombotics/antiplatelets: Secondary | ICD-10-CM

## 2015-09-25 DIAGNOSIS — Z79899 Other long term (current) drug therapy: Secondary | ICD-10-CM

## 2015-09-25 DIAGNOSIS — R55 Syncope and collapse: Secondary | ICD-10-CM | POA: Diagnosis present

## 2015-09-25 DIAGNOSIS — Z90710 Acquired absence of both cervix and uterus: Secondary | ICD-10-CM

## 2015-09-25 DIAGNOSIS — Z955 Presence of coronary angioplasty implant and graft: Secondary | ICD-10-CM

## 2015-09-25 DIAGNOSIS — I10 Essential (primary) hypertension: Secondary | ICD-10-CM | POA: Diagnosis present

## 2015-09-25 LAB — URINALYSIS WITH MICROSCOPIC REFLEX IF INDICATED BMC/JMC ONLY
BILIRUBIN: NEGATIVE mg/dL
BLOOD: NEGATIVE mg/dL
BLOOD: NEGATIVE mg/dL
GLUCOSE: NEGATIVE mg/dL
KETONES: NEGATIVE mg/dL
LEUKOCYTES: NEGATIVE WBCs/uL
NITRITE: NEGATIVE
PH: 7 (ref <8.0)
PROTEIN: 100 mg/dL — AB
SPECIFIC GRAVITY: 1.016 (ref <1.022)
UROBILINOGEN: 4 mg/dL — AB (ref <=2.0)

## 2015-09-25 LAB — CBC WITH DIFF
BASOPHIL #: 0.1 x10ˆ3/uL (ref 0.00–0.10)
BASOPHIL %: 1 % (ref 0–3)
EOSINOPHIL #: 0.2 x10ˆ3/uL (ref 0.00–0.50)
EOSINOPHIL %: 2 % (ref 0–5)
HCT: 29.2 % — ABNORMAL LOW (ref 36.0–45.0)
HGB: 9.6 g/dL — ABNORMAL LOW (ref 12.0–15.5)
LYMPHOCYTE #: 1.3 x10ˆ3/uL (ref 1.00–4.80)
LYMPHOCYTE %: 15 % (ref 15–43)
MCH: 29.7 pg (ref 27.5–33.2)
MCHC: 32.8 g/dL (ref 32.0–36.0)
MCV: 90.4 fL (ref 82.0–97.0)
MONOCYTE #: 0.7 x10ˆ3/uL (ref 0.20–0.90)
MONOCYTE %: 8 % (ref 5–12)
MPV: 8.3 fL (ref 7.4–10.5)
NEUTROPHIL #: 6.6 x10ˆ3/uL — ABNORMAL HIGH (ref 1.50–6.50)
NEUTROPHIL %: 75 % (ref 43–76)
PLATELETS: 346 x10ˆ3/uL (ref 150–450)
RBC: 3.23 x10ˆ6/uL — ABNORMAL LOW (ref 4.00–5.10)
RDW: 19.5 % — ABNORMAL HIGH (ref 11.0–16.0)
WBC: 8.9 x10ˆ3/uL (ref 4.0–11.0)

## 2015-09-25 LAB — COMPREHENSIVE METABOLIC PROFILE - BMC/JMC ONLY
ALBUMIN/GLOBULIN RATIO: 1.7 (ref 0.8–2.0)
ALBUMIN: 3.8 g/dL (ref 3.5–5.0)
ALKALINE PHOSPHATASE: 77 U/L (ref 38–126)
ALT (SGPT): 12 U/L — ABNORMAL LOW (ref 14–54)
ANION GAP: 7 mmol/L (ref 3–11)
AST (SGOT): 24 U/L (ref 15–41)
BILIRUBIN TOTAL: 0.9 mg/dL (ref 0.3–1.2)
BUN/CREA RATIO: 12 (ref 6–22)
BUN: 14 mg/dL (ref 6–20)
CALCIUM: 8.8 mg/dL (ref 8.8–10.2)
CHLORIDE: 105 mmol/L (ref 101–111)
CO2 TOTAL: 25 mmol/L (ref 22–32)
CREATININE: 1.19 mg/dL — ABNORMAL HIGH (ref 0.44–1.00)
ESTIMATED GFR: 55 mL/min/1.73mˆ2 — ABNORMAL LOW (ref >60)
GLUCOSE: 155 mg/dL — ABNORMAL HIGH (ref 70–110)
POTASSIUM: 4 mmol/L (ref 3.4–5.1)
PROTEIN TOTAL: 6.1 g/dL — ABNORMAL LOW (ref 6.4–8.3)
SODIUM: 137 mmol/L (ref 136–145)

## 2015-09-25 LAB — URINALYSIS, MICROSCOPIC

## 2015-09-25 LAB — TROPONIN-I
TROPONIN I: <0.03 ng/mL (ref <=0.06)
TROPONIN I: <0.03 ng/mL (ref <=0.06)

## 2015-09-25 LAB — GOLD TOP TUBE

## 2015-09-25 LAB — H & H
HCT: 30.9 % — ABNORMAL LOW (ref 36.0–45.0)
HGB: 9.7 g/dL — ABNORMAL LOW (ref 12.0–15.5)

## 2015-09-25 LAB — CREATINE KINASE (CK), TOTAL, SERUM OR PLASMA: CREATINE KINASE: 94 U/L (ref 38–234)

## 2015-09-25 LAB — LAVENDER TOP TUBE

## 2015-09-25 LAB — BLUE TOP TUBE

## 2015-09-25 LAB — GREEN TUBE

## 2015-09-25 MED ORDER — BUPROPION HCL SR 150 MG TABLET,12 HR SUSTAINED-RELEASE
150.0000 mg | ORAL_TABLET | Freq: Every day | ORAL | Status: DC
Start: 2015-09-25 — End: 2015-09-25
  Filled 2015-09-25 (×3): qty 1

## 2015-09-25 MED ORDER — LABETALOL 100 MG TABLET
100.00 mg | ORAL_TABLET | Freq: Two times a day (BID) | ORAL | Status: DC
Start: 2015-09-25 — End: 2015-09-26
  Administered 2015-09-25: 100 mg via ORAL
  Filled 2015-09-25: qty 1

## 2015-09-25 MED ORDER — NITROGLYCERIN 0.4 MG SUBLINGUAL TABLET
0.40 mg | SUBLINGUAL_TABLET | SUBLINGUAL | Status: DC | PRN
Start: 2015-09-25 — End: 2015-09-26

## 2015-09-25 MED ORDER — SODIUM CHLORIDE 0.9 % (FLUSH) INJECTION SYRINGE
10.0000 mL | INJECTION | INTRAMUSCULAR | Status: DC | PRN
Start: 2015-09-25 — End: 2015-09-26

## 2015-09-25 MED ORDER — ENOXAPARIN 40 MG/0.4 ML SUB-Q SYRINGE - EAST
40.0000 mg | INJECTION | Freq: Every day | SUBCUTANEOUS | Status: DC
Start: 2015-09-25 — End: 2015-09-25

## 2015-09-25 MED ORDER — ESCITALOPRAM 10 MG TABLET
20.0000 mg | ORAL_TABLET | Freq: Every day | ORAL | Status: DC
Start: 2015-09-25 — End: 2015-09-26
  Administered 2015-09-25: 0 mg via ORAL

## 2015-09-25 MED ORDER — ASPIRIN 81 MG CHEWABLE TABLET
81.0000 mg | CHEWABLE_TABLET | Freq: Every day | ORAL | Status: DC
Start: 2015-09-25 — End: 2015-09-25

## 2015-09-25 MED ORDER — CLOPIDOGREL 75 MG TABLET
75.0000 mg | ORAL_TABLET | Freq: Every day | ORAL | Status: DC
Start: 2015-09-25 — End: 2015-09-25

## 2015-09-25 MED ORDER — FENTANYL (PF) 50 MCG/ML INJECTION SOLUTION
50.00 ug | INTRAMUSCULAR | Status: AC
Start: 2015-09-25 — End: 2015-09-25
  Administered 2015-09-25: 50 ug via INTRAVENOUS
  Filled 2015-09-25: qty 2

## 2015-09-25 MED ORDER — HYDRALAZINE 20 MG/ML INJECTION SOLUTION
10.00 mg | Freq: Three times a day (TID) | INTRAMUSCULAR | Status: DC | PRN
Start: 2015-09-25 — End: 2015-09-25

## 2015-09-25 MED ORDER — ONDANSETRON HCL (PF) 4 MG/2 ML INJECTION SOLUTION
4.00 mg | INTRAMUSCULAR | Status: AC
Start: 2015-09-25 — End: 2015-09-25
  Administered 2015-09-25: 4 mg via INTRAVENOUS
  Filled 2015-09-25: qty 2

## 2015-09-25 MED ORDER — SODIUM CHLORIDE 0.9 % (FLUSH) INJECTION SYRINGE
10.0000 mL | INJECTION | Freq: Three times a day (TID) | INTRAMUSCULAR | Status: DC
Start: 2015-09-25 — End: 2015-09-26

## 2015-09-25 MED ORDER — ROSUVASTATIN 20 MG TABLET
20.00 mg | ORAL_TABLET | Freq: Every evening | ORAL | Status: DC
Start: 2015-09-25 — End: 2015-09-26

## 2015-09-25 MED ORDER — ACETAMINOPHEN 325 MG TABLET
650.0000 mg | ORAL_TABLET | Freq: Four times a day (QID) | ORAL | Status: DC | PRN
Start: 2015-09-25 — End: 2015-09-26

## 2015-09-25 MED ORDER — HYDRALAZINE 20 MG/ML INJECTION SOLUTION
10.00 mg | Freq: Three times a day (TID) | INTRAMUSCULAR | Status: DC | PRN
Start: 2015-09-25 — End: 2015-09-26
  Administered 2015-09-25: 10 mg via INTRAVENOUS
  Filled 2015-09-25: qty 1

## 2015-09-25 MED ORDER — BUPROPION HCL SR 150 MG TABLET,12 HR SUSTAINED-RELEASE
150.00 mg | ORAL_TABLET | Freq: Every evening | ORAL | Status: DC
Start: 2015-09-25 — End: 2015-09-26
  Filled 2015-09-25 (×2): qty 1

## 2015-09-25 MED ADMIN — dilTIAZem CD 240 mg capsule,extended release 24 hr: @ 21:00:00

## 2015-09-25 NOTE — Nurses Notes (Signed)
Pt left with [employer] flight crew @ this time to be transferred to [facility] rm 340, Dr. Craige CottaSchmitt accepting.

## 2015-09-25 NOTE — ED Nurses Note (Signed)
Report to Bill, RN

## 2015-09-25 NOTE — Progress Notes (Signed)
SURGERY    Asked to see patient regarding expanding right periorbital hematoma  [facility] due to no opthamologist available here  No active hemorrhage  Suggested pressure  Nothing for general surgery to do at this time  Agree with transfer    Jonita AlbeeJohn Luane Rochon, MD  09/25/2015, 16:1020:08

## 2015-09-25 NOTE — ED Nurses Note (Signed)
When primary rn removed bandage, hematoma fell down above and around eye.   MD notified immediately and instructed Primary RN to rewrap.     Wound cleaned and dressed.

## 2015-09-25 NOTE — ED Triage Notes (Signed)
Fell down stairs. Can't remember how.   Contusion to forehead.   Pt is on plavix.

## 2015-09-25 NOTE — ED Nurses Note (Signed)
Dr. Clinton SawyerWilliamson unwrapped dressing and asked that we leave it unwrapped until hospitalist sees hematoma.

## 2015-09-25 NOTE — ED Nurses Note (Signed)
Denies change in vision.   Denies chest pain or difficulty breathing.   Denies abdominal pain.

## 2015-09-25 NOTE — ED Nurses Note (Signed)
Arrived in c collar.

## 2015-09-25 NOTE — Nurses Notes (Signed)
@  1850 report from [place], 6th floor nurse, attempted to be called to ICU. I informed him that the receiving nurse was just walking in for her shift, and if at all possible, could he please call back in 10 minutes. Also stated that if necessary, I would stop charge nurse report and take report from him now. Bill/RN stated that pt was stable enough that he could call back shortly.     @1910  (approx) transfer report was accepted by receiving ICU nurse for pt transfer from 6th floor.    @1930  Pt arrived via floor bed from 6th floor. Pt removed from remote telemetry and connected to ICU-11 telemetry monitor. Request made to sending nurse, Bill,  to keep pt in the floor bed for transport to CT and that we would return the floor bed to the proper room when done.      @1935 . Pt remained in transport bed in order to transport to radiology for head CT.     @1945  CT contacted by phone and informed primary nurse that CT could accept pt in 15 minutes for head CT.    @1950  [employer] dispatch informed us that air transport team was in route and would be arriving in 15 minutes.    @1952  Call to radiology to D/C head CT.    @1955  Transport forms, copy of chart, and CD of radiology exams being readied for pt transfer.    @2005  (approx) [employer] air crew arrived at bedside. Verbal report given to flight nurse by primary nurse. Transfer documentation handed to flight nurse. Second copy of transfer documentation for flight nurse made by her request. Both copies returned to flight nurse.    @2010  pt transferred from floor bed to flight gurney.    @2015  pt was administered Zofran and Fentanyl IVP by flight nurse, for pt c/o nausea and pain.    @2020  flight crew exited ICU with pt for transport to [facility] T[facility] #304.    @2021  [facility] bed management manager notified of flight crew departure time, with expected arrival time of 2100-2105.    Roswell MinersKevin Berenize Gatlin, RN / Charge nurse

## 2015-09-25 NOTE — Nurses Notes (Signed)
Report called to A. Sengpiehl RN [facility]. [employer] air here to fly pt out.

## 2015-09-25 NOTE — ED Nurses Note (Signed)
Attempted to call report

## 2015-09-25 NOTE — ED Nurses Note (Signed)
Pt has steady gait with no assist.

## 2015-09-25 NOTE — Discharge Summary (Signed)
[facility]    DISCHARGE SUMMARY      PATIENT NAME:  [name], [name]  MRN:  [id number]  DOB:  Nov 04, 1950    ADMISSION DATE:  09/25/2015  DISCHARGE DATE:  09/25/2015    ATTENDING PHYSICIAN: Jewel Baize, MD  PRIMARY CARE PHYSICIAN: Sid Falcon, MD     ADMISSION DIAGNOSIS: <principal problem not specified>  Chief Complaint   Patient presents with    Head Pain       DISCHARGE DIAGNOSIS:   Hospital Problems) (* Primary Problem)    Diagnosis Date Noted    Syncope and collapse 09/25/2015      Resolved Hospital Problems    Diagnosis Date Noted Date Resolved   No resolved problems to display.     Active Non-Hospital Problems    Diagnosis Date Noted    Chest pain 08/24/2015    Essential hypertension 04/16/2014    Hypokalemia 04/16/2014    Mixed hyperlipidemia 07/30/2013    Coronary artery disease     HTN (hypertension)       DISCHARGE MEDICATIONS:     Current Discharge Medication List      CONTINUE these medications - NO CHANGES were made during your visit.       Details    buPROPion 150 mg Tablet Sustained Release   Commonly known as:  WELLBUTRIN SR    150 mg, Oral, Daily   Refills:  0       CRESTOR 20 mg Tablet   Generic drug:  rosuvastatin    TAKE 1 TABLET EVERY EVENING   Qty:  90 Tab   Refills:  2       escitalopram oxalate 20 mg Tablet   Commonly known as:  LEXAPRO    20 mg, Oral, Daily   Refills:  0       labetalol 100 mg Tablet   Commonly known as:  NORMODYNE    100 mg, Oral, Q12H   Qty:  60 Tab   Refills:  0       pantoprazole 40 mg Tablet, Delayed Release (E.C.)   Commonly known as:  PROTONIX    40 mg, Oral, Daily   Refills:  0         STOP taking these medications.          CHILDREN'S ASPIRIN 81 mg Tablet, Chewable   Generic drug:  aspirin       clopidogrel 75 mg Tablet   Commonly known as:  PLAVIX       cyclobenzaprine 10 mg Tablet   Commonly known as:  FLEXERIL       nitroGLYCERIN 0.3 mg Tablet, Sublingual   Commonly known as:  NITROSTAT           DISCHARGE INSTRUCTIONS:     DISCHARGE  INSTRUCTION - DIET   Diet: CARDIAC DIET      DISCHARGE INSTRUCTION - ACTIVITY   Activity: GRADUALLY INCREASE ACTIVITY AS TOLERATED      ASPIRIN NOT ORDERED AT THIS TIME         REASON FOR HOSPITALIZATION AND HOSPITAL COURSE:  This is a 65 y.o., female admitted after syncope with progression of right supraorbital hematoma extending upto right cheek and involving the right eye.    SIGNIFICANT PHYSICAL FINDINGS: refer to h/p from today.  SIGNIFICANT LAB: emr  SIGNIFICANT RADIOLOGY: emr  CONSULTATIONS: surgery          COURSE IN HOSPITAL: Trauma- With hematoma right supraorbital region involving the right eye and extending to  the cheek with oozing blood. Transfer to ICU, Neuro checks, Repeat CT facial bones stat requested however they have another trauma case lined up. Monitor H/H. Hold ASA Plavix. [facility]. No ophthalmologist available. May need drainage of the hematoma.    Hypertensive urgency- Hydralazine prn for SBP> 160/100.    Syncope- Monitor on telemetry, repeat echo and carotid doppler. serial troponins.    CAD- hold ASA , Plavix due to active bleeding. Ct labetalol.    Obesity- encouraged to lose weight with healthy diet and exercise after acute issues resolve.    DVT ppx- not indicated due to bleeding.  Code status. Full code.  Dispo- transfer to tertiary care center.  Will need to fly as ground transport is delayed to tomorrow morning    DOES PATIENT HAVE ADVANCED DIRECTIVES:  No, Information Offered and Given    ADVANCED CARE PLANNING - full code    CONDITION ON DISCHARGE: Alert, Oriented and VS Stable    DISCHARGE DISPOSITION:  [facility]    Copies sent to Care Team       Relationship Specialty Notifications Start End    Sid FalconGlassford, Justin, MD PCP - General EXTERNAL Abnormal results only, Admissions 07/30/13     Phone: [phone] Fax: [phone]         [facility] [address] [place]  [phone]

## 2015-09-25 NOTE — ED Provider Notes (Signed)
Charline Bills, MD  [facility]  Emergency Department Visit Note    Date:  09/25/2015  Primary care provider:  Sid Falcon, MD  Means of arrival:  ambulance  History obtained from: patient  History limited by: none    Chief Complaint:  Status post fall injuries    HISTORY OF PRESENT ILLNESS     [name], date of birth April 18, 1951, is a 65 y.o. female who presents to the Emergency Department due to status post fall injuries. The patient reports that she was ambulating down the steps today when she fell. She is unaware about what caused her fall and only remembers waking up at the bottom of the steps. EMS placed the patient in a C-collar for neck discomfort and bandaged her forehead secondary to a laceration that was actively bleeding. No alcohol or drug use.    The patient reports that she "hasn't felt all right" for awhile and had an extensive evaluation for chest pain during her recent admission, 2/28-08/27/2015. She notes a recent fall in her home without known cause. The patient takes Plavix PO and Aspirin PO.    Associated Symptoms:   Positive:  Nausea, headache at 9/10 severity  Negative:  Chills, sweats, measured fevers, chest pain, vomiting, known syncope, pre-syncopal sensation prior to fall, vision changes,     REVIEW OF SYSTEMS     The pertinent positive and negative symptoms are as per HPI. All other systems reviewed and are negative.     PATIENT HISTORY     Past Medical History:  Past Medical History:   Diagnosis Date    Coronary artery disease     Depression     HTN (hypertension)     Hyperlipemia     Mixed hyperlipidemia 07/30/2013    Obesity     Wears glasses      Past Surgical History:  Past Surgical History:   Procedure Laterality Date    Coronary artery angioplasty      Hx cervical diskectomy      Hx cervical spine surgery      Hx heart catheterization      Hx hysterectomy      Hx laminoplasty      Hx tmj arthotomy  x2     Family History:  Family History      Problem Relation Age of Onset    Hypertension Sister     Diabetes Sister     Hypertension Maternal Grandmother     Coronary Artery Disease Maternal Grandmother      elderly    Coronary Artery Disease Maternal Uncle      elderly     Social History:  Social History   Substance Use Topics    Smoking status: Never Smoker    Smokeless tobacco: Never Used    Alcohol use No     History   Drug Use No     Medications:  Current Outpatient Prescriptions   Medication Sig    buPROPion (WELLBUTRIN SR) 150 mg Oral Tablet Sustained Release Take 150 mg by mouth Once a day     CHILDREN'S ASPIRIN 81 mg Oral Tablet, Chewable TAKE 1 TABLET DAILY    clopidogrel (PLAVIX) 75 mg Oral Tablet Take 1 Tab (75 mg total) by mouth Once a day .Follow Blood work with PCP.    CRESTOR 20 mg Oral Tablet TAKE 1 TABLET EVERY EVENING    cyclobenzaprine (FLEXERIL) 10 mg Oral Tablet Take 10 mg by mouth Three times  a day    escitalopram oxalate (LEXAPRO) 20 mg Oral Tablet Take 20 mg by mouth Once a day    labetalol (NORMODYNE) 100 mg Oral Tablet Take 1 Tab (100 mg total) by mouth Every 12 hours    nitroglycerin (NITROSTAT) 0.3 mg Sublingual Tablet, Sublingual 1 Tab (0.3 mg total) by Sublingual route Every 5 minutes as needed for Chest pain for 3 doses over 15 minutes     Allergies:  Allergies   Allergen Reactions    Capoten [Captopril]  Other Adverse Reaction (Add comment)     Hypotension    Demerol [Meperidine]     Dilaudid [Hydromorphone] Mental Status Effect    Tramadol Diarrhea       PHYSICAL EXAM     Vitals:  Filed Vitals:    09/25/15 1156   BP: (!) 174/89   Pulse: 80   Resp: 16   Temp: 36.7 C (98.1 F)   SpO2: 96%       Pulse ox  96% on None (Room Air) interpreted by me as: Normal    Constitutional: Appears well-developed and well-nourished. Appears uncomfortable with C-collar in place and dressing to the forehead.  HENT:   Head: Large amount of swelling to the supraorbital region on the right with miniscule abrasion versus  laceration that will not require suture.  Nose: No rhinorrhea.   Mouth/Throat: No posterior oropharyngeal erythema.   Eyes: EOM are grossly normal. Pupils are equal, round, and reactive to light.   Neck: No JVD present. Carotid bruit is not present. Neck is supple with full range of motion. Non-tender.  Cardiovascular: Normal rate and regular rhythm. No murmurs, rubs, or gallops.   Pulmonary/Chest: No accessory muscle usage. No respiratory distress. Lungs are clear to auscultation. No wheezes, rales, or rhonchi.  Abdominal: Bowel sounds are normal. There is no tenderness.   Lymphadenopathy:  No cervical adenopathy.   Extremities: Normal range of motion. No edema.  Neurological: Alert with sensory motor intact. No CN deficit. Normal finger to nose. Normal mentation. Pupillary response intact with equal size.  Skin: No rash noted. No cyanosis.   Psychiatric: Normal mood and affect. Behavior is normal.      DIAGNOSTIC STUDIES     Labs:    Results for orders placed or performed during the hospital encounter of 09/25/15   BLUE TOP TUBE   Result Value Ref Range    RAINBOW/EXTRA TUBE AUTO RESULT Yes    COMPREHENSIVE METABOLIC PROFILE - [facility] ONLY   Result Value Ref Range    SODIUM 137 136 - 145 mmol/L    POTASSIUM 4.0 3.4 - 5.1 mmol/L    CHLORIDE 105 101 - 111 mmol/L    CO2 TOTAL 25 22 - 32 mmol/L    ANION GAP 7 3 - 11 mmol/L    BUN 14 6 - 20 mg/dL    CREATININE 9.14 (H) 0.44 - 1.00 mg/dL    BUN/CREA RATIO 12 6 - 22    ESTIMATED GFR 55 (L) >60 mL/min/1.67m2    ALBUMIN 3.8 3.5 - 5.0 g/dL    CALCIUM 8.8 8.8 - 78.2 mg/dL    GLUCOSE 956 (H) 70 - 110 mg/dL    ALKALINE PHOSPHATASE 77 38 - 126 U/L    ALT (SGPT) 12 (L) 14 - 54 U/L    AST (SGOT) 24 15 - 41 U/L    BILIRUBIN TOTAL 0.9 0.3 - 1.2 mg/dL    PROTEIN TOTAL 6.1 (L) 6.4 - 8.3 g/dL    ALBUMIN/GLOBULIN  RATIO 1.7 0.8 - 2.0   TROPONIN-I   Result Value Ref Range    TROPONIN I <0.03 <=0.06 ng/mL   URINALYSIS WITH MICROSCOPIC REFLEX IF INDICATED [facility] ONLY   Result Value Ref  Range    COLOR Yellow Light Yellow, Straw, Yellow    APPEARANCE Slightly Cloudy (A) Clear    PH 7.0 <8.0    LEUKOCYTES Negative Negative WBCs/uL    NITRITE Negative Negative    PROTEIN 100  (A) Negative mg/dL    GLUCOSE Negative Negative mg/dL    KETONES Negative Negative mg/dL    UROBILINOGEN 4.0   (A) <=2.0 mg/dL    BILIRUBIN Negative Negative mg/dL    BLOOD Negative Negative mg/dL    SPECIFIC GRAVITY 1.610 <1.022   CBC WITH DIFF   Result Value Ref Range    WBC 8.9 4.0 - 11.0 x103/uL    RBC 3.23 (L) 4.00 - 5.10 x106/uL    HGB 9.6 (L) 12.0 - 15.5 g/dL    HCT 96.0 (L) 45.4 - 45.0 %    MCV 90.4 82.0 - 97.0 fL    MCH 29.7 27.5 - 33.2 pg    MCHC 32.8 32.0 - 36.0 g/dL    RDW 09.8 (H) 11.9 - 16.0 %    PLATELETS 346 150 - 450 x103/uL    MPV 8.3 7.4 - 10.5 fL    NEUTROPHIL % 75 43 - 76 %    LYMPHOCYTE % 15 15 - 43 %    MONOCYTE % 8 5 - 12 %    EOSINOPHIL % 2 0 - 5 %    BASOPHIL % 1 0 - 3 %    NEUTROPHIL # 6.60 (H) 1.50 - 6.50 x103/uL    LYMPHOCYTE # 1.30 1.00 - 4.80 x103/uL    MONOCYTE # 0.70 0.20 - 0.90 x103/uL    EOSINOPHIL # 0.20 0.00 - 0.50 x103/uL    BASOPHIL # 0.10 0.00 - 0.10 x103/uL   URINALYSIS, MICROSCOPIC   Result Value Ref Range    RBCS 0-2 0 - 2 /hpf    WBCS 0-2 0 - 2 /hpf    BACTERIA None None /hpf    SQUAMOUS EPITHELIAL None 0-2/hpf /hpf    MUCOUS Marked (A) None /hpf    HYALINE CASTS 30-50 (A) None, 0-2 /lpf     Labs reviewed and interpreted by me.    Radiology:    XR CHEST AP PORTABLE - No acute findings  CT BRAIN WO IV CONTRAST - No acute intracranial findings. 4 cm right supraorbital subcutaneous hematoma.  Radiological imaging interpreted by radiologist and independently reviewed by me.    EKG:  12 lead EKG interpreted by me shows normal sinus rhythm, rate of 81 bpm, leftward axis, normal intervals, no ST elevation, no Q waves.     ED PROGRESS NOTE / MEDICAL DECISION MAKING     Old records reviewed by me:  I have reviewed the patient's recent past medical history. Nurse's notes reviewed.  Previous admission for chest pain reviewed. Investigated for ACS and had a normal stress test.     Orders Placed This Encounter    CT BRAIN WO IV CONTRAST    XR CHEST AP PORTABLE    COMPREHENSIVE METABOLIC PROFILE - [facility] ONLY    TROPONIN-I    URINALYSIS WITH MICROSCOPIC REFLEX IF INDICATED [facility] ONLY    CBC WITH DIFF    URINALYSIS, MICROSCOPIC    OXYGEN - NASAL CANNULA    ECG 12-Lead  INSERT & MAINTAIN PERIPHERAL IV ACCESS    fentaNYL (SUBLIMAZE) 50 mcg/mL injection    ondansetron (ZOFRAN) 2 mg/mL injection    fentaNYL (SUBLIMAZE) 50 mcg/mL injection       EKG ordered.    12:17 PM - Initial evaluation of the patient complete. I will proceed with evaluation of head trauma and syncope. CT Brain, CXR, CBC, CMP, Troponin, and UA ordered. The patient understands and is in agreement with this treatment plan.    12:21 PM - C-collar removed.    12:55 PM - Per ED nurse, the patient is requesting medication for her 9/10 headache. IV Fentanyl and IV Zofran ordered to assist with her symptoms.    2:55 PM - Per ED nurse, patient continues to report pain. Fentanyl IV ordered.    3:37 PM - On recheck, I reviewed the results of the patient's diagnostics. I recommended admission for syncope observation. The patient is in agreement.    3:38 PM - Hospitalist Service paged.    3:44 PM - I discussed the patient's case and above findings [facility] who has agreed to make arrangements for admission.    Pre-Disposition Vitals:  Filed Vitals:    09/25/15 1200 09/25/15 1215 09/25/15 1400 09/25/15 1445   BP: (!) 176/88 (!) 176/88 (!) 180/83 (!) 179/93   Pulse:       Resp:       Temp:       SpO2: 97% 97% 99%        CLINICAL IMPRESSION     Encounter Diagnoses   Name Primary?    Syncope and collapse Yes    Hematoma        DISPOSITION/PLAN     Admitted        Condition at Disposition: Stable        SCRIBE ATTESTATION STATEMENT  I Neil CrouchBrittany Waltz, SCRIBE scribed for Charline BillsWilliamson, Demeco Ducksworth H, MD on  09/25/2015 at 12:16 PM.     Documentation assistance provided for Charline BillsWilliamson, Onesti Bonfiglio H, MD  by Neil CrouchBrittany Waltz, SCRIBE. Information recorded by the scribe was done at my direction and has been reviewed and validated by me Charline BillsWilliamson, Felina Tello H, MD.

## 2015-09-25 NOTE — Nurses Notes (Signed)
RN Annette StableBill called report to ICU. Patient transported to ICU. Facesheet faxed to number provided by Dr. Ann HeldBagree.  CD of CT has already been made and in the ICU, RN Bill aware of this while transporting patient to ICU.

## 2015-09-25 NOTE — H&P (Signed)
[facility]  [place], [place] [address]    General History and Physical    [place]  Date of Admission:  09/25/2015  Date of Birth:  Jul 24, 1950    PCP: Sid Falcon, MD  Chief Complaint:  fall      HPI: [name] is a 65 y.o., Black/African American female with h/o CAD (stent 2014) , HTn, Hyperlipidemia  who presents after a fall from the steps and hitting the concrete .She is unaware about what caused her fall and only remembers waking up at the bottom of the steps. EMS placed the patient in a C-collar for neck discomfort and bandaged her forehead secondary to a laceration that was actively bleeding. She is unable to recall anything unusual before the fall. She also reports a similar episode in the house last week where she had no recall of the events. She did not sustain any physical injury at that time. Currently she has a headache more on the right frontal area 6/10 sharp, relieved with fentanyl given in the ER. She denies any loss of consciousness, no vomiting but experienced nausea after the fall. She denies dizziness, no blurry vision on the other side. She took her ASA and Plavix today.   She has some abrasions over right 3 rd finger. No back pain, no chest pain. She is unable to open her right eye. She denies any palpitations.      Active Hospital Problems   (*Primary Problem)    Diagnosis    Syncope and collapse       Past Medical History:   Diagnosis Date    Coronary artery disease     Depression     HTN (hypertension)     Hyperlipemia     Mixed hyperlipidemia 07/30/2013    Obesity     Wears glasses            Past Surgical History:   Procedure Laterality Date    CORONARY ARTERY ANGIOPLASTY      HX CERVICAL DISKECTOMY      HX CERVICAL SPINE SURGERY      HX HEART CATHETERIZATION      stent in June 2014    HX HYSTERECTOMY      HX LAMINOPLASTY      HX TMJ ARTHOTOMY  x2           Medications Prior to Admission     Prescriptions    buPROPion  (WELLBUTRIN SR) 150 mg Oral Tablet Sustained Release    Take 150 mg by mouth Once a day     CHILDREN'S ASPIRIN 81 mg Oral Tablet, Chewable    TAKE 1 TABLET DAILY    clopidogrel (PLAVIX) 75 mg Oral Tablet    Take 1 Tab (75 mg total) by mouth Once a day .Follow Blood work with PCP.    CRESTOR 20 mg Oral Tablet    TAKE 1 TABLET EVERY EVENING    cyclobenzaprine (FLEXERIL) 10 mg Oral Tablet    Take 10 mg by mouth Three times a day    escitalopram oxalate (LEXAPRO) 20 mg Oral Tablet    Take 20 mg by mouth Once a day    labetalol (NORMODYNE) 100 mg Oral Tablet    Take 1 Tab (100 mg total) by mouth Every 12 hours    nitroglycerin (NITROSTAT) 0.3 mg Sublingual Tablet, Sublingual    1 Tab (0.3 mg total) by Sublingual route Every 5 minutes as needed for Chest pain for 3 doses  over 15 minutes          Current Facility-Administered Medications:  acetaminophen (TYLENOL) tablet 650 mg Oral Q6H PRN   buPROPion (WELLBUTRIN SR) sustained release tablet 150 mg Oral Daily   escitalopram (LEXAPRO) tablet 20 mg Oral Daily   labetalol (NORMODYNE) tablet 100 mg Oral Q12H   nitroGLYCERIN (NITROSTAT) sublingual tablet 0.4 mg Sublingual Q5 Min PRN   NS flush syringe 10 mL Intravenous Q8HRS   NS flush syringe 10 mL Intravenous Q1H PRN   rosuvastatin (CRESTOR) tablet 20 mg Oral QPM       Allergies   Allergen Reactions    Capoten [Captopril]  Other Adverse Reaction (Add comment)     Hypotension    Demerol [Meperidine]     Dilaudid [Hydromorphone] Mental Status Effect    Tramadol Diarrhea       Social History   Substance Use Topics    Smoking status: Never Smoker    Smokeless tobacco: Never Used    Alcohol use No       Family History   Problem Relation Age of Onset    Hypertension Sister     Diabetes Sister     Hypertension Maternal Grandmother     Coronary Artery Disease Maternal Grandmother      elderly    Coronary Artery Disease Maternal Uncle      elderly           ROS:   Constitutional: negative for fevers and chills  Eyes:  negative for unable to open right eye due to swelling  Ears, nose, mouth, throat, and face: positive for hematoma over right eye and extending to right cheek  Respiratory: negative for cough or sputum  Cardiovascular: negative for dyspnea, palpitations and irregular heart beats  Gastrointestinal: negative for nausea and vomiting  Genitourinary:negative for dysuria and nocturia  Integument/breast: negative for rash  Hematologic/lymphatic: negative for easy bruising  Musculoskeletal:negative for myalgias and arthralgias  Neurological: positive for headaches        DNR Status:  Full Code    EXAM:  Temperature: 36.7 C (98 F)  Heart Rate: 93  BP (Non-Invasive): (!) 188/107  Respiratory Rate: 18  SpO2-1: 94 %  Pain Score (Numeric, Faces): 8  General: appears in good health. No distress.   Eyes: Left Pupil round, reactive to light and accomodation. Right eyelid swollen, bruised, unable to open eye, blood trickling from the corner. Blood also oozing from the supraorbital area of hematoma.   HEENT: Head - area of heamtoma extending from the right supraorbital area including the right eye and extending to there cheek.  Neck: No JVD or thyromegaly or lymphadenopathy   Lungs: Clear to auscultation bilaterally.   Cardiovascular: regular rate and rhythm, S1, S2 normal, no murmur  Abdomen: Soft, non-tender, Bowel sounds normal, No hepatosplenomegaly   Extremities: extremities normal, atraumatic, no cyanosis or edema Right third digit bandaged.  Skin: Skin warm and dry   Neurologic: Grossly normal , No motor deficit, B/l DTR's 2+  Lymphatics: No lymphadenopathy   Psychiatric: Normal affect, behavior,         Labs:    Lab Results for Last 24 Hours:    Results for orders placed or performed during the hospital encounter of 09/25/15 (from the past 24 hour(s))   Green Tube   Result Value Ref Range    RAINBOW/EXTRA TUBE AUTO RESULT Yes    TROPONIN-I   Result Value Ref Range    TROPONIN I <0.03 <=0.06 ng/mL  BLUE TOP TUBE   Result  Value Ref Range    RAINBOW/EXTRA TUBE AUTO RESULT Yes    GOLD TOP TUBE   Result Value Ref Range    RAINBOW/EXTRA TUBE AUTO RESULT Yes    LAVENDER TOP TUBE   Result Value Ref Range    RAINBOW/EXTRA TUBE AUTO RESULT Yes    COMPREHENSIVE METABOLIC PROFILE - [facility] ONLY   Result Value Ref Range    SODIUM 137 136 - 145 mmol/L    POTASSIUM 4.0 3.4 - 5.1 mmol/L    CHLORIDE 105 101 - 111 mmol/L    CO2 TOTAL 25 22 - 32 mmol/L    ANION GAP 7 3 - 11 mmol/L    BUN 14 6 - 20 mg/dL    CREATININE 1.61 (H) 0.44 - 1.00 mg/dL    BUN/CREA RATIO 12 6 - 22    ESTIMATED GFR 55 (L) >60 mL/min/1.4m2    ALBUMIN 3.8 3.5 - 5.0 g/dL    CALCIUM 8.8 8.8 - 09.6 mg/dL    GLUCOSE 045 (H) 70 - 110 mg/dL    ALKALINE PHOSPHATASE 77 38 - 126 U/L    ALT (SGPT) 12 (L) 14 - 54 U/L    AST (SGOT) 24 15 - 41 U/L    BILIRUBIN TOTAL 0.9 0.3 - 1.2 mg/dL    PROTEIN TOTAL 6.1 (L) 6.4 - 8.3 g/dL    ALBUMIN/GLOBULIN RATIO 1.7 0.8 - 2.0   CBC WITH DIFF   Result Value Ref Range    WBC 8.9 4.0 - 11.0 x103/uL    RBC 3.23 (L) 4.00 - 5.10 x106/uL    HGB 9.6 (L) 12.0 - 15.5 g/dL    HCT 40.9 (L) 81.1 - 45.0 %    MCV 90.4 82.0 - 97.0 fL    MCH 29.7 27.5 - 33.2 pg    MCHC 32.8 32.0 - 36.0 g/dL    RDW 91.4 (H) 78.2 - 16.0 %    PLATELETS 346 150 - 450 x103/uL    MPV 8.3 7.4 - 10.5 fL    NEUTROPHIL % 75 43 - 76 %    LYMPHOCYTE % 15 15 - 43 %    MONOCYTE % 8 5 - 12 %    EOSINOPHIL % 2 0 - 5 %    BASOPHIL % 1 0 - 3 %    NEUTROPHIL # 6.60 (H) 1.50 - 6.50 x103/uL    LYMPHOCYTE # 1.30 1.00 - 4.80 x103/uL    MONOCYTE # 0.70 0.20 - 0.90 x103/uL    EOSINOPHIL # 0.20 0.00 - 0.50 x103/uL    BASOPHIL # 0.10 0.00 - 0.10 x103/uL   URINALYSIS WITH MICROSCOPIC REFLEX IF INDICATED [facility] ONLY   Result Value Ref Range    COLOR Yellow Light Yellow, Straw, Yellow    APPEARANCE Slightly Cloudy (A) Clear    PH 7.0 <8.0    LEUKOCYTES Negative Negative WBCs/uL    NITRITE Negative Negative    PROTEIN 100  (A) Negative mg/dL    GLUCOSE Negative Negative mg/dL    KETONES Negative Negative  mg/dL    UROBILINOGEN 4.0   (A) <=2.0 mg/dL    BILIRUBIN Negative Negative mg/dL    BLOOD Negative Negative mg/dL    SPECIFIC GRAVITY 9.562 <1.022   URINALYSIS, MICROSCOPIC   Result Value Ref Range    RBCS 0-2 0 - 2 /hpf    WBCS 0-2 0 - 2 /hpf    BACTERIA None None /hpf    SQUAMOUS EPITHELIAL None  0-2/hpf /hpf    MUCOUS Marked (A) None /hpf    HYALINE CASTS 30-50 (A) None, 0-2 /lpf       Imaging Studies:     XR CHEST AP PORTABLE - No acute findings  CT BRAIN WO IV CONTRAST - No acute intracranial findings. 4 cm right supraorbital subcutaneous hematoma.  Radiological imaging interpreted by radiologist and independently reviewed by me.    EKG:  12 lead EKG interpreted by me shows normal sinus rhythm, rate of 81 bpm, leftward axis, normal intervals, no ST elevation, no Q waves  DVT RISK FACTORS HAVE BEN ASSESSED AND PROPHYLAXIS ORDERED (SEE RUBYONLINE - REFERENCE TOOLS - MD, DVT PROPHY OR POCKET CARD)    Assessment/Plan:    Trauma- With hematoma right supraorbital region involving the right eye and extending to the cheek with oozing blood.  Transfer to ICU, Neuro checks, Repeat CT facial bones stat requested however they have another trauma case lined up. Monitor H/H. Hold ASA Plavix. Discussed with Dr. Welton Flakes at [facility] , accepted to [facility], No ophthalmologist available. May need drainage of the hematoma.    Hypertensive urgency- Hydralazine prn for SBP> 160/100.    Syncope- Monitor on telemetry, repeat echo and carotid doppler. serial troponins.    CAD- hold ASA , Plavix due to active bleeding. Ct labetalol.    Obesity- encouraged to lose weight with healthy diet and exercise after acute issues resolve.    DVT ppx- not indicated due to bleeding.  Code status. Full code.  Dispo- transfer to [facility] town [facility]   Will need to fly as ground transport is delayed to tomorrow morning.    Portions of this note may be dictated using voice recognition software or a dictation service.  Variances in spelling and vocabulary are possible and unintentional. Not all errors are caught/corrected. Please notify the Thereasa Parkin if any discrepancies are noted or if the meaning of any statement is not clear.

## 2015-09-25 NOTE — Care Management Notes (Signed)
[facility] following a syncopal episode and a fall from standing on concrete.  Laceration to right forehead repaired in the ED.  CT scan at 1240 was negative.  Patient has hematoma that has now extended over the right eye.    Dr. Ann HeldBagree was connected with Dr. Welton FlakesKhan who accepted this patient under trauma service to a stepdown bed.

## 2015-09-25 NOTE — Nurses Notes (Signed)
Pt arrived to ICU - 11 from the 6th floor at this time.

## 2015-09-30 LAB — ECG 12-LEAD
Atrial Rate: 81 {beats}/min
Calculated P Axis: 53 degrees
Calculated R Axis: -25 degrees
Calculated T Axis: 54 degrees
PR Interval: 180 ms
QRS Duration: 88 ms
QT Interval: 394 ms
QTC Calculation: 457 ms
Ventricular rate: 81 {beats}/min

## 2015-11-19 ENCOUNTER — Encounter (INDEPENDENT_AMBULATORY_CARE_PROVIDER_SITE_OTHER): Payer: Self-pay | Admitting: Physician Assistant

## 2015-11-19 ENCOUNTER — Ambulatory Visit (INDEPENDENT_AMBULATORY_CARE_PROVIDER_SITE_OTHER): Payer: Commercial Managed Care - PPO | Admitting: Audiologist

## 2015-11-23 ENCOUNTER — Ambulatory Visit (INDEPENDENT_AMBULATORY_CARE_PROVIDER_SITE_OTHER): Payer: Commercial Managed Care - PPO | Admitting: Audiologist

## 2015-11-23 ENCOUNTER — Ambulatory Visit (INDEPENDENT_AMBULATORY_CARE_PROVIDER_SITE_OTHER): Payer: Commercial Managed Care - PPO | Admitting: Otolaryngology

## 2015-11-23 ENCOUNTER — Encounter (INDEPENDENT_AMBULATORY_CARE_PROVIDER_SITE_OTHER): Payer: Self-pay | Admitting: Otolaryngology

## 2015-11-23 VITALS — BP 122/84 | HR 78 | Temp 97.6°F | Ht 65.0 in | Wt 200.0 lb

## 2015-11-23 DIAGNOSIS — H903 Sensorineural hearing loss, bilateral: Secondary | ICD-10-CM

## 2015-11-23 DIAGNOSIS — H93A2 Pulsatile tinnitus, left ear: Secondary | ICD-10-CM

## 2015-11-23 DIAGNOSIS — Z6833 Body mass index (BMI) 33.0-33.9, adult: Secondary | ICD-10-CM

## 2015-11-23 DIAGNOSIS — R42 Dizziness and giddiness: Secondary | ICD-10-CM

## 2015-11-23 DIAGNOSIS — R2689 Other abnormalities of gait and mobility: Secondary | ICD-10-CM

## 2015-11-23 DIAGNOSIS — R55 Syncope and collapse: Secondary | ICD-10-CM

## 2015-11-23 NOTE — Patient Instructions (Addendum)
No known ear cause for dizziness.  Recommend patient discussed tilt table testing and physical therapy for balance if negative tilt testing with cardiologist.  Follow up as needed with ENT.

## 2015-11-23 NOTE — Procedures (Signed)
Patient: [name]  MR Number: [id number]  Date of Birth: 1950/08/15  Date of Service: 11/23/2015    SUBJECTIVE:  This is a 65 y.o. female who presents today for an audiometric evaluation. Patient reported a recent history of dizziness and falls which do not occur at the same time. Patient has been seen by ENT in 2015 for dizziness and lightheadedness. Patient reported recent dizziness occurs with and without movement up to 1-2 times a day. Patient noted that reported dizziness can feel like room spinning sensations, but not all the time. Patient reported some days there is no dizziness. Patient also reported a recent history of falls without known triggers. Patient reported 1-2 falls prior to April and at least 3 falls since April. Patient noted that her fall in April resulted in injury to her face. Patient reported she does not remember falling and feels it is possible she is losing consciousness during this time. Patient has a history of bilateral SNHL and reported left pulsatile tinnitus. Patient feels tinnitus has gotten worse with reported dizziness and falls.     ASSESSMENT:  Comprehensive audiometric evaluation and tympanometric testing revealed bilateral WNL to mild SNHL w/ excellent WRS and Type A tympanograms    IMPRESSION:  Bilateral SNHL    RECOMMENDATIONS: ENT follow up, repeat audiogram as needed, minimize fall risk, and HAE following medical clearance and if patient desires    Alwyn RenLindsay Lorrayne Ismael, Au.D., CCC-A, FAAA  Clinical Audiologist

## 2015-11-23 NOTE — Progress Notes (Addendum)
PATIENT NAME:  [name]  MRN:  [id number]  DOB:  Jun 26, 1951  DATE OF SERVICE: 11/23/2015    HPI:  [name] is a 65 y.o. year old female who presents for evaluation for dizziness and passing out. Patient was seen on one occasion in our office in April 2015 for evaluation of dizziness with negative ear cause and negative [employer]. Patient states her dizziness did resolve on its own up until January/February 2017 when she began to experience daily lightheadedness, spinning and imbalance when sitting and when walking. Patient states episodes last a few minutes without associated nausea/vomiting, headache or ear symptoms including ear fullness/ringing/decrease in hearing. Patient reports a history of left ear tinnitus without change during episodes. Patient reports 3-4 falls since April 2017. Patient reports worse fall was when she was standing on top of the stairs without recalled dizziness and awoke the bottom of stairs. Patient states that she has had other episodes that are similar where she just passes out without known dizziness. Patient has taken Meclizine previously which she states did not help and does not take currently. Patient denies any new medications. Patient denies personal history of ear issues/surgery and personal/family history of Meniere's disease. Patient has a history of cardiac stent placed in June 2014. Patient reports a history of migraines which she has not experience for "a long time". Patient does report headaches since April 2017 fall. Patient has not seen her neurologist - Dr. Vanessa Barbara - recently. No other complaints.    Physical Exam:  Blood pressure 122/84, pulse 78, temperature 36.4 C (97.6 F), temperature source Thermal Scan, height 1.651 m ( ), weight 90.7 kg (200 lb).  Body mass index is 33.28 kg/(m^2).  General Appearance: Pleasant, cooperative, healthy, and in no acute distress.  Eyes: Conjunctivae/corneas clear, PERRLA, EOM's intact.  Head and Face:  Normocephalic, atraumatic.  Face symmetric, no obvious lesions.   Pinnae: Normal shape and position.   External auditory canals:  Patent without inflammation or drainage.  Tympanic membranes:  Intact, translucent, midposition, middle ear aerated.  Nose:  External pyramid midline. Septum roughly midline. Enlarged turbinates. Mucosa normal. No purulence, polyps, or crusts.   Oral Cavity/Oropharynx: No mucosal lesions, masses, or pharyngeal asymmetry.  Neck:  No palpable thyroid, salivary gland, or neck masses.  Heme/Lymph:  No cervical adenopathy.  Cardiovascular:  Good perfusion of upper extremities.  No cyanosis of the hands or fingers.  Lungs: No apparent stridorous breathing. No acute distress.  Skin: Skin warm and dry.  Neurologic: Cranial nerves:  grossly intact.  Psychiatric:  Alert and oriented x 3.    Data Reviewed:    11/23/2015 Audiogram/Tympanogram  ASSESSMENT: Comprehensive audiometric evaluation and tympanometric testing revealed bilateral WNL to mild SNHL w/ excellent WRS and Type A tympanograms  IMPRESSION: Bilateral SNHL  Alwyn Ren, Au.D., CCC-A, FAAA  Clinical Audiologist    09/25/2015 CT BRAIN WO IV CONTRAST  IMPRESSION:  No acute intracranial findings. 4 cm right supraorbital subcutaneous hematoma.      Assessment:    ICD-10-CM    1. Dizziness R42    2. Lightheadedness R42    3. Imbalance R26.89    4. Syncope R55    5. Bilateral sensorineural hearing loss H90.3        Plan: No current effusion/infection. Patient declines Dix-Hallpike at time of visit. No known ear cause for patient's episodic dizziness/lighheadeness/imbalance. Recommend patient discuss tilt table testing and physical therapy for balance if negative tilt testing with cardiologist - Dr. Barbara Cower.  Follow up as needed with ENT.     No orders of the defined types were placed in this encounter.    Return if symptoms worsen or fail to improve.    Patient was seen by Dr. Candelaria CelesteMakary during visit.    Sandi CarneAngela Ettinger, PA-C  Supervising  physician: Alric SetonSohrab Shahab, MD  Supervising physician: Arnold Longhadi Abigail Teall, MD  [facility]  [facility]    I saw and examined the patient.  I formulated the plan with the PA. I reviewed the PA's note.  I agree with the findings and plan of care as documented in the PA's note.  Any exceptions/additions are edited/noted.      Shemaiah Round A. Candelaria CelesteMakary, MD  Assistant Professor  Otolaryngology-Head and Neck Surgery  [place]

## 2015-12-09 ENCOUNTER — Ambulatory Visit (HOSPITAL_BASED_OUTPATIENT_CLINIC_OR_DEPARTMENT_OTHER): Payer: Commercial Managed Care - PPO | Attending: INTERNAL MEDICINE - CARDIOVASCULAR DISEASE

## 2015-12-09 DIAGNOSIS — I340 Nonrheumatic mitral (valve) insufficiency: Secondary | ICD-10-CM | POA: Insufficient documentation

## 2015-12-09 DIAGNOSIS — I2510 Atherosclerotic heart disease of native coronary artery without angina pectoris: Secondary | ICD-10-CM | POA: Insufficient documentation

## 2015-12-09 DIAGNOSIS — E782 Mixed hyperlipidemia: Secondary | ICD-10-CM | POA: Insufficient documentation

## 2015-12-09 DIAGNOSIS — R55 Syncope and collapse: Secondary | ICD-10-CM | POA: Insufficient documentation

## 2015-12-09 DIAGNOSIS — Z955 Presence of coronary angioplasty implant and graft: Secondary | ICD-10-CM | POA: Insufficient documentation

## 2015-12-09 DIAGNOSIS — I10 Essential (primary) hypertension: Secondary | ICD-10-CM | POA: Insufficient documentation

## 2015-12-09 DIAGNOSIS — R42 Dizziness and giddiness: Secondary | ICD-10-CM | POA: Insufficient documentation

## 2015-12-09 DIAGNOSIS — I341 Nonrheumatic mitral (valve) prolapse: Secondary | ICD-10-CM

## 2015-12-09 DIAGNOSIS — R0789 Other chest pain: Secondary | ICD-10-CM | POA: Insufficient documentation

## 2015-12-09 DIAGNOSIS — I361 Nonrheumatic tricuspid (valve) insufficiency: Secondary | ICD-10-CM | POA: Insufficient documentation

## 2015-12-09 DIAGNOSIS — R002 Palpitations: Secondary | ICD-10-CM | POA: Insufficient documentation

## 2015-12-09 LAB — THYROXINE, TOTAL T4: T4 TOTAL: 5.8 ug/dL (ref 5.0–12.0)

## 2015-12-09 LAB — BASIC METABOLIC PANEL
ANION GAP: 9 mmol/L (ref 3–11)
BUN/CREA RATIO: 11 (ref 6–22)
BUN: 11 mg/dL (ref 6–20)
CALCIUM: 9.2 mg/dL (ref 8.8–10.2)
CHLORIDE: 101 mmol/L (ref 101–111)
CO2 TOTAL: 33 mmol/L — ABNORMAL HIGH (ref 22–32)
CREATININE: 1.02 mg/dL — ABNORMAL HIGH (ref 0.44–1.00)
ESTIMATED GFR: >60 mL/min/1.73mˆ2 (ref >60)
GLUCOSE: 93 mg/dL (ref 70–110)
POTASSIUM: 3 mmol/L — ABNORMAL LOW (ref 3.4–5.1)
SODIUM: 143 mmol/L (ref 136–145)

## 2015-12-09 LAB — CBC WITH DIFF
BASOPHIL #: 0.1 x10ˆ3/uL (ref 0.00–0.10)
BASOPHIL %: 1 % (ref 0–3)
EOSINOPHIL #: 0.2 x10ˆ3/uL (ref 0.00–0.50)
EOSINOPHIL %: 3 % (ref 0–5)
HCT: 35.7 % — ABNORMAL LOW (ref 36.0–45.0)
HGB: 11.1 g/dL — ABNORMAL LOW (ref 12.0–15.5)
LYMPHOCYTE #: 1.7 x10ˆ3/uL (ref 1.00–4.80)
LYMPHOCYTE %: 22 % (ref 15–43)
MCH: 25.9 pg — ABNORMAL LOW (ref 27.5–33.2)
MCHC: 31.1 g/dL — ABNORMAL LOW (ref 32.0–36.0)
MCV: 83.3 fL (ref 82.0–97.0)
MONOCYTE #: 0.6 x10ˆ3/uL (ref 0.20–0.90)
MONOCYTE %: 7 % (ref 5–12)
MPV: 8.7 fL (ref 7.4–10.5)
NEUTROPHIL #: 5.2 x10ˆ3/uL (ref 1.50–6.50)
NEUTROPHIL %: 67 % (ref 43–76)
NEUTROPHIL %: 67 % (ref 43–76)
PLATELETS: 261 10*3/uL (ref 150–450)
PLATELETS: 261 x10ˆ3/uL (ref 150–450)
RBC: 4.29 x10ˆ6/uL (ref 4.00–5.10)
RDW: 16.8 % — ABNORMAL HIGH (ref 11.0–16.0)
WBC: 7.7 10*3/uL (ref 4.0–11.0)
WBC: 7.7 x10ˆ3/uL (ref 4.0–11.0)

## 2015-12-09 LAB — MAGNESIUM: MAGNESIUM: 1.6 mg/dL (ref 1.4–2.1)

## 2015-12-09 LAB — FOLATE: FOLATE: >20 ng/mL (ref >=4.5)

## 2015-12-09 LAB — TRIIODOTHYRONINE, TOTAL (TOTAL T3): T3 TOTAL: 106 ng/dL (ref 58–159)

## 2015-12-09 LAB — VITAMIN B12: VITAMIN B 12: 222 pg/mL (ref 180–914)

## 2015-12-09 LAB — THYROID STIMULATING HORMONE WITH FREE T4 REFLEX: TSH: 1.668 u[IU]/mL (ref 0.340–5.330)

## 2015-12-14 LAB — VITAMIN D, SERUM (25 HYDROXYVITAMIN D2 AND D3 BY MS)
25-HYDROXYVIT D2/D3,TOTAL: 28.1 ng/mL (ref 20.0–150.0)
25-HYDROXYVITAMIN D2: <4 ng/mL
25-HYDROXYVITAMIN D3: 28.1 ng/mL

## 2016-04-26 ENCOUNTER — Encounter (INDEPENDENT_AMBULATORY_CARE_PROVIDER_SITE_OTHER): Payer: Self-pay | Admitting: INTERNAL MEDICINE - CARDIOVASCULAR DISEASE

## 2016-04-26 NOTE — Progress Notes (Deleted)
[facility] [place]  [address], [address]  [place] [id number]  [phone]    Date: 04/26/2016  Patient Name: [name]  MRN#: [id number]  DOB: [birth year]    Provider: Venita Lickuna Bhattacharya, MD  PCP: No primary care provider on file.    History:   [name] is a 65 y.o. female with a history of CAD, Syncope, Valvular Heart Disease, HTN, and Hyperlipidemia.  She was last seen on 02-17-16 when she complained of dizziness with postural change and intermittent left-sided chest pain, however reported the onset of her symptoms occurred shortly after starting Wellbutrin and Lexapro, therefore she was instructed to discuss alternative medications with her PCP and a future Loop Recorder implant was discussed if her symptoms persist or worsen, otherwise no changes were made.  Today she ...    Past Medical History:     Patient Active Problem List   Diagnosis    Coronary artery disease    HTN (hypertension)    Mixed hyperlipidemia    Essential hypertension    Hypokalemia    Chest pain    Syncope and collapse       Past Surgical History:       Allergies:     Allergies   Allergen Reactions    Capoten [Captopril]  Other Adverse Reaction (Add comment)     Hypotension    Demerol [Meperidine]     Dilaudid [Hydromorphone] Mental Status Effect    Tramadol Diarrhea       Medications:     Current Outpatient Prescriptions   Medication Sig    buPROPion (WELLBUTRIN SR) 150 mg Oral Tablet Sustained Release Take 150 mg by mouth Once a day     CRESTOR 20 mg Oral Tablet TAKE 1 TABLET EVERY EVENING    cyclobenzaprine (FLEXERIL) 10 mg Oral Tablet Take 10 mg by mouth Three times a day    escitalopram oxalate (LEXAPRO) 20 mg Oral Tablet Take 20 mg by mouth Once a day    labetalol (NORMODYNE) 100 mg Oral Tablet Take 1 Tab (100 mg total) by mouth Every 12 hours       Family History:     Family Medical History     Problem Relation (Age of Onset)    Coronary Artery Disease Maternal  Grandmother, Maternal Uncle    Diabetes Sister    Hypertension Sister, Maternal Grandmother              Social History:     Social History     Social History    Marital status: Widowed     Spouse name: N/A    Number of children: N/A    Years of education: N/A     Occupational History    Not on file.     Social History Main Topics    Smoking status: Never Smoker    Smokeless tobacco: Never Used    Alcohol use No    Drug use: No    Sexual activity: Not on file     Other Topics Concern    Not on file     Social History Narrative       Review of Systems:     All systems were reviewed and are negative other than noted in the HPI.    Physical Exam:   There were no vitals taken for this visit.    GENERAL: Grossly normal stature and nutrition. No problems noted.    HEENT: Head is normocephalic,  atraumatic, without any gross head or neck masses. Ocular exam reveals EOMI and PERRLA. Neck   exam reveals neck is supple with no bruits.    NEUROLOGICAL/PSYCHOLOGICAL: No facial drooping, slurred speech, or altered motor function noted.    CARDIAC: Auscultation of the Heart is normal. There are no murmurs, clicks, rubs or gallop.    RESPIRATORY: Breath sounds clear and equal bilaterally. Normal chest wall excursion noted. No cough. No wheezing. Ausculation of   lungs reveal clear lung fields.    GASTROINTESTINAL: The abdomen is soft and nontender.    VASCULAR/EXTREMITIES: No clubing or cyanosis. No edema present bilateral lower extremities. No varicose veins.    SKIN: Inspection of skin shows no rash, lesions, temperature or color changes.    ABDOMEN: Abdomen soft, nontender, + bowel sounds present without palpable masses or bruits.     Cardiovascular Workup:     Nuclear Stress Test 08/27/15  1. Post-stress SPECT images estimated ejection fraction to be 62% and normal.  There is normal wall motion and normal wall ischemia.  2.  Following pharmacological stress testing, there does not appear to be any significant  inducible ischemia or scar noted on myocardial perfusion imaging scan.  3.  Overall, this is a low risk nuclear stress test.  4.  Please correlate this finding as clinically based on sensitivity and specificity of this test.  Risk factor modification and treatment of comorbid conditions should continue, in order to reduce future cardiac morbidity and mortality.    Echo 10/22/15  1. The left ventricle is thickened in a fashion consistent with asymmetric septal hypertrophy. Grade I   impaired relaxation. The LVEF is calculated in the apical four-chamber view to be 56%.   2. Diffuse thickening (sclerosis) without reduced excursion in the aortic valve. There is evidence of mild   aortic regurgitation.   3. There is annular calcification of the mitral valve. There is evidence of mild to moderate mitral   regurgitation.   4. The Pulmonic Valve was grossly normal.   5. There is evidence of mild to moderate tricuspid regurgitation.   6. There is pericardial effusion of trivial size. There is no hemodynamic compromise.    Carotid Duplex 10/22/15  1. There is no significant atheromatous plaque formation observed in the right common, internal or   external carotid arteries. The intimal lining is smooth and without obvious irregularity.   2. There is no significant atheromatous plaque formation observed in the left common, internal or   external carotid arteries. The intimal lining is smooth and without obvious irregularity.   3. The bilateral vertebral artery flow is antegrade.   4. The bilateral subclavian artery flow dynamics are within normal limits.     Assessment:     [name] is 65 y.o. female with    CAD - c/o intermittent left-sided chest pain  Syncope - c/o continued dizziness with postural change; Discussed possible Loop Recorder implant if sxs persist or worsen  Valvular Heart Disease - c/o continued dizziness with postural change  HTN - BP controlled today  Hyperlipidemia - On a statin; Encouraged heart  healthy diet and exercise    Plan:     1. Continue current medications      Thank you for allowing me to participate in the care of your patients. Please feel free to contact me if there are further questions.

## 2016-04-27 ENCOUNTER — Encounter (INDEPENDENT_AMBULATORY_CARE_PROVIDER_SITE_OTHER): Payer: Self-pay | Admitting: INTERNAL MEDICINE - CARDIOVASCULAR DISEASE

## 2016-04-27 ENCOUNTER — Ambulatory Visit (INDEPENDENT_AMBULATORY_CARE_PROVIDER_SITE_OTHER): Payer: Commercial Managed Care - PPO | Admitting: INTERNAL MEDICINE - CARDIOVASCULAR DISEASE

## 2016-04-27 VITALS — BP 144/88 | HR 102 | Resp 17 | Ht 65.0 in | Wt 194.8 lb

## 2016-04-27 DIAGNOSIS — I10 Essential (primary) hypertension: Secondary | ICD-10-CM

## 2016-04-27 DIAGNOSIS — Z6832 Body mass index (BMI) 32.0-32.9, adult: Secondary | ICD-10-CM

## 2016-04-27 DIAGNOSIS — R030 Elevated blood-pressure reading, without diagnosis of hypertension: Secondary | ICD-10-CM

## 2016-04-27 NOTE — Progress Notes (Addendum)
[facility] [place]  [address], [address]  [place] [id number]  [phone]    Date: 04/27/2016  Patient Name: [name]  [name]#: [id number]  DOB: [birth year]    Provider: Venita Lickuna Kingstyn Deruiter, MD  PCP: Sid FalconJustin Glassford, MD    History:   [name] is a 65 y.o. woman with a history of CAD, Syncope, Valvular Heart Disease, HTN, and Hyperlipidemia.  She was last seen on 02-17-16 when she complained of dizziness with postural change and intermittent left-sided chest pain, however reported the onset of her symptoms occurred shortly after starting Wellbutrin and Lexapro, therefore she was instructed to discuss alternative medications with her PCP and a future Loop Recorder implant was discussed if her symptoms persist or worsen, otherwise no changes were made.  Today she complaints of continued episodes of dizziness, however denies any syncope.  She admits to monitoring her BP at home regularly and getting readings in the 180's.  She reports she has been driving although she doesn't drive when she is symptomatic.  She complaints of occasional palpitations, as well.  She reports she saw her neurologist and had an EEG, which was normal. She has some abnormalities but nothing that needed to be treated. She was told she could drive again by the neurologist.       Past Medical History:     Patient Active Problem List   Diagnosis    Coronary artery disease    HTN (hypertension)    Mixed hyperlipidemia    Essential hypertension    Hypokalemia    Chest pain    Syncope and collapse       Past Surgical History:       Allergies:     Allergies   Allergen Reactions    Capoten [Captopril]  Other Adverse Reaction (Add comment)     Hypotension    Demerol [Meperidine]     Dilaudid [Hydromorphone] Mental Status Effect    Tramadol Diarrhea       Medications:     Current Outpatient Prescriptions   Medication Sig    aspirin 81 mg Oral Tablet, Chewable Take 81 mg by mouth Once a day     CRESTOR 20 mg Oral Tablet TAKE 1 TABLET EVERY EVENING    cyclobenzaprine (FLEXERIL) 10 mg Oral Tablet Take 10 mg by mouth Three times a day    labetalol (NORMODYNE) 100 mg Oral Tablet Take 1 Tab (100 mg total) by mouth Every 12 hours       Family History:     Family Medical History     Problem Relation (Age of Onset)    Coronary Artery Disease Maternal Grandmother, Maternal Uncle    Diabetes Sister    Hypertension Sister, Maternal Grandmother              Social History:     Social History     Social History    Marital status: Widowed     Spouse name: N/A    Number of children: N/A    Years of education: N/A     Occupational History    Not on file.     Social History Main Topics    Smoking status: Never Smoker    Smokeless tobacco: Never Used    Alcohol use No    Drug use: No    Sexual activity: Not on file     Other Topics Concern    Not on file     Social History  Narrative       Review of Systems:     All systems were reviewed and are negative other than noted in the HPI.    Physical Exam:   Blood pressure (!) 144/88, pulse (!) 102, resp. rate 17, height 1.651 m (5\' 5" ), weight 88.4 kg (194 lb 12.8 oz), SpO2 97 %.    GENERAL: Grossly normal stature and nutrition. No problems noted.    HEENT: Head is normocephalic, atraumatic, without any gross head or neck masses. Ocular exam reveals EOMI and PERRLA. Neck   exam reveals neck is supple with no bruits.    NEUROLOGICAL/PSYCHOLOGICAL: No facial drooping, slurred speech, or altered motor function noted.    CARDIAC: Auscultation of the Heart is normal. There are no murmurs, clicks, rubs or gallop.    RESPIRATORY: Breath sounds clear and equal bilaterally. Normal chest wall excursion noted. No cough. No wheezing. Ausculation of   lungs reveal clear lung fields.    GASTROINTESTINAL: The abdomen is soft and nontender.    VASCULAR/EXTREMITIES: No clubing or cyanosis. No edema present bilateral lower extremities. No varicose veins.    SKIN: Inspection of skin  shows no rash, lesions, temperature or color changes.    ABDOMEN: Abdomen soft, nontender, + bowel sounds present without palpable masses or bruits.     Cardiovascular Workup:     Nuclear Stress Test 08/27/15  1. Post-stress SPECT images estimated ejection fraction to be 62% and normal.  There is normal wall motion and normal wall ischemia.  2.  Following pharmacological stress testing, there does not appear to be any significant inducible ischemia or scar noted on myocardial perfusion imaging scan.  3.  Overall, this is a low risk nuclear stress test.  4.  Please correlate this finding as clinically based on sensitivity and specificity of this test.  Risk factor modification and treatment of comorbid conditions should continue, in order to reduce future cardiac morbidity and mortality.    Echo 10/22/15  1. The left ventricle is thickened in a fashion consistent with asymmetric septal hypertrophy. Grade I   impaired relaxation. The LVEF is calculated in the apical four-chamber view to be 56%.   2. Diffuse thickening (sclerosis) without reduced excursion in the aortic valve. There is evidence of mild   aortic regurgitation.   3. There is annular calcification of the mitral valve. There is evidence of mild to moderate mitral   regurgitation.   4. The Pulmonic Valve was grossly normal.   5. There is evidence of mild to moderate tricuspid regurgitation.   6. There is pericardial effusion of trivial size. There is no hemodynamic compromise.    Carotid Duplex 10/22/15  1. There is no significant atheromatous plaque formation observed in the right common, internal or   external carotid arteries. The intimal lining is smooth and without obvious irregularity.   2. There is no significant atheromatous plaque formation observed in the left common, internal or   external carotid arteries. The intimal lining is smooth and without obvious irregularity.   3. The bilateral vertebral artery flow is antegrade.   4. The bilateral  subclavian artery flow dynamics are within normal limits.     Assessment:     Aasiya Creasey is 65 y.o. woman with    CAD - c/o intermittent left-sided chest pain  Syncope - c/o continued dizziness with postural change; Discussed possible Loop Recorder implant after having BP Monitor done  Valvular Heart Disease - c/o continued dizziness with postural change  HTN - BP borderline today; Reports elevated BP readings at home in the 180's  Hyperlipidemia - On a statin; Encouraged heart healthy diet and exercise    Plan:     1. Continue current medications  2. Order 24 Hour BP Monitor to r/o [place] HTN and assess home BP readings  3. Follow up in 1 month      Thank you for allowing me to participate in the care of your patients. Please feel free to contact me if there are further questions.       I personally performed the services described in this documentation, as scribed in my presence, and it is both accurate and complete.    Venita Lickuna Quanna Wittke, MD    Venita Lickuna Stephenson Cichy, MD  06/15/2016, 09:48

## 2016-05-15 ENCOUNTER — Ambulatory Visit (HOSPITAL_BASED_OUTPATIENT_CLINIC_OR_DEPARTMENT_OTHER): Payer: Commercial Managed Care - PPO | Attending: Nephrology

## 2016-05-15 DIAGNOSIS — I129 Hypertensive chronic kidney disease with stage 1 through stage 4 chronic kidney disease, or unspecified chronic kidney disease: Secondary | ICD-10-CM | POA: Insufficient documentation

## 2016-05-15 DIAGNOSIS — E876 Hypokalemia: Secondary | ICD-10-CM | POA: Insufficient documentation

## 2016-05-15 DIAGNOSIS — I10 Essential (primary) hypertension: Secondary | ICD-10-CM

## 2016-05-15 DIAGNOSIS — N179 Acute kidney failure, unspecified: Secondary | ICD-10-CM | POA: Insufficient documentation

## 2016-05-15 LAB — BASIC METABOLIC PANEL
ANION GAP: 9 mmol/L (ref 3–11)
BUN/CREA RATIO: 15 (ref 6–22)
BUN: 13 mg/dL (ref 6–20)
CALCIUM: 9.3 mg/dL (ref 8.8–10.2)
CHLORIDE: 104 mmol/L (ref 101–111)
CO2 TOTAL: 29 mmol/L (ref 22–32)
CREATININE: 0.86 mg/dL (ref 0.44–1.00)
ESTIMATED GFR: >60 mL/min/1.73mˆ2 (ref >60)
GLUCOSE: 83 mg/dL (ref 70–110)
POTASSIUM: 4 mmol/L (ref 3.4–5.1)
SODIUM: 142 mmol/L (ref 136–145)
SODIUM: 142 mmol/L (ref 136–145)

## 2016-05-23 ENCOUNTER — Encounter (INDEPENDENT_AMBULATORY_CARE_PROVIDER_SITE_OTHER): Payer: Commercial Managed Care - PPO

## 2016-05-23 DIAGNOSIS — R030 Elevated blood-pressure reading, without diagnosis of hypertension: Secondary | ICD-10-CM

## 2016-05-26 ENCOUNTER — Telehealth (INDEPENDENT_AMBULATORY_CARE_PROVIDER_SITE_OTHER): Payer: Self-pay | Admitting: INTERNAL MEDICINE - CARDIOVASCULAR DISEASE

## 2016-05-26 ENCOUNTER — Other Ambulatory Visit (INDEPENDENT_AMBULATORY_CARE_PROVIDER_SITE_OTHER): Payer: Self-pay | Admitting: INTERNAL MEDICINE - CARDIOVASCULAR DISEASE

## 2016-05-26 MED ORDER — AMLODIPINE 10 MG TABLET
10.0000 mg | ORAL_TABLET | Freq: Every day | ORAL | 1 refills | Status: DC
Start: 2016-05-26 — End: 2016-09-29

## 2016-05-26 NOTE — Telephone Encounter (Signed)
Pt's 24 HR BP monitor showed elevated BPs throughout the day (see attached document). Per Dr Barbara CowerBhattacharya she is to increase her labetalol to 200 mg BID and follow up as scheduled on 12/5. LVM.     Pt called back stating she could not increase her labetalol per her nephrologist. Per Dr Barbara CowerBhattacharya she is to start Norvasc 5 mg daily. Script sent by Lynden Angathy, LPN to pt's pharmacy and pt is agreeable. She will follow up as scheduled on 12/5.     [name]  05/26/2016, 09:57

## 2016-05-29 ENCOUNTER — Encounter (INDEPENDENT_AMBULATORY_CARE_PROVIDER_SITE_OTHER): Payer: Self-pay | Admitting: INTERNAL MEDICINE - CARDIOVASCULAR DISEASE

## 2016-05-29 NOTE — Progress Notes (Deleted)
[facility] [place]  [address], [address]  [place] [id number]  [phone]    Date: 05/29/2016  Patient Name: [name]  MRN#: [id number]  DOB: 1950-12-14    Provider: Venita Lickuna Bhattacharya, MD  PCP: Sid FalconJustin Glassford, MD       Reason for Visit:     History:   [name] is a 65 y.o. woman with a history of CAD, Syncope, Valvular Heart Disease, HTN, and Hyperlipidemia.  She was last seen on 04-27-16 when she complained of continued episodes of dizziness with postural change, occasional palpitations, and elevated BP at home, therefore a 24 Hour BP Monitor was ordered and a future Loop Recorder implant was discussed pending her BP Monitor results.  Today she -  No c/o any CP, SOB, palpitations, syncope/presyncope, orthopnea/PND, LE edema, or claudication.    Past Medical History:     Patient Active Problem List   Diagnosis    Coronary artery disease    HTN (hypertension)    Mixed hyperlipidemia    Essential hypertension    Hypokalemia    Chest pain    Syncope and collapse       Past Surgical History:     Past Surgical History:   Procedure Laterality Date    CORONARY ARTERY ANGIOPLASTY      HX CERVICAL DISKECTOMY      HX CERVICAL SPINE SURGERY      HX HEART CATHETERIZATION      stent in June 2014    HX HYSTERECTOMY      HX LAMINOPLASTY      HX TMJ ARTHOTOMY  x2     Allergies:     Allergies   Allergen Reactions    Capoten [Captopril]  Other Adverse Reaction (Add comment)     Hypotension    Demerol [Meperidine]     Dilaudid [Hydromorphone] Mental Status Effect    Tramadol Diarrhea       Medications:     Current Outpatient Prescriptions   Medication Sig    amLODIPine (NORVASC) 10 mg Oral Tablet Take 1 Tab (10 mg total) by mouth Once a day    aspirin 81 mg Oral Tablet, Chewable Take 81 mg by mouth Once a day    CRESTOR 20 mg Oral Tablet TAKE 1 TABLET EVERY EVENING    cyclobenzaprine (FLEXERIL) 10 mg Oral Tablet Take 10 mg by mouth Three times a day              Family History:     Family Medical History     Problem Relation (Age of Onset)    Coronary Artery Disease Maternal Grandmother, Maternal Uncle    Diabetes Sister    Hypertension Sister, Maternal Grandmother          Social History:     Social History     Social History    Marital status: Widowed     Spouse name: N/A    Number of children: N/A    Years of education: N/A     Occupational History    Not on file.     Social History Main Topics    Smoking status: Never Smoker    Smokeless tobacco: Never Used    Alcohol use No    Drug use: No    Sexual activity: Not on file     Other Topics Concern    Not on file     Social History Narrative  Review of Systems:     All systems were reviewed and are negative other than noted in the HPI.    Physical Exam:   There were no vitals taken for this visit.    GENERAL: Grossly normal stature and nutrition. No problems noted.    HEENT: Head is normocephalic, atraumatic, without any gross head or neck masses. Ocular exam reveals EOMI and PERRLA. Neck   exam reveals neck is supple with no bruits.    NEUROLOGICAL/PSYCHOLOGICAL: No facial drooping, slurred speech, or altered motor function noted.    CARDIAC: Auscultation of the Heart is normal. There are no murmurs, clicks, rubs or gallop.    RESPIRATORY: Breath sounds clear and equal bilaterally. Normal chest wall excursion noted. No cough. No wheezing. Ausculation of   lungs reveal clear lung fields.    GASTROINTESTINAL: The abdomen is soft and nontender.    VASCULAR/EXTREMITIES: No clubing or cyanosis. No edema present bilateral lower extremities. No varicose veins.    SKIN: Inspection of skin shows no rash, lesions, temperature or color changes.    ABDOMEN: Abdomen soft, nontender, + bowel sounds present without palpable masses or bruits.     Cardiovascular Workup:     Nuclear Stress Test 08/27/15  1. Post-stress SPECT images estimated ejection fraction to be 62% and normal.  There is normal wall motion and  normal wall ischemia.  2.  Following pharmacological stress testing, there does not appear to be any significant inducible ischemia or scar noted on myocardial perfusion imaging scan.  3.  Overall, this is a low risk nuclear stress test.  4.  Please correlate this finding as clinically based on sensitivity and specificity of this test.  Risk factor modification and treatment of comorbid conditions should continue, in order to reduce future cardiac morbidity and mortality.    Echo 10/22/15  1. The left ventricle is thickened in a fashion consistent with asymmetric septal hypertrophy. Grade I impaired relaxation. The LVEF is calculated in the apical four-chamber view to be 56%.   2. Diffuse thickening (sclerosis) without reduced excursion in the aortic valve. There is evidence of mild aortic regurgitation.   3. There is annular calcification of the mitral valve. There is evidence of mild to moderate mitral regurgitation.   4. The Pulmonic Valve was grossly normal.   5. There is evidence of mild to moderate tricuspid regurgitation.   6. There is pericardial effusion of trivial size. There is no hemodynamic compromise.    Carotid Duplex 10/22/15  1. There is no significant atheromatous plaque formation observed in the right common, internal or external carotid arteries. The intimal lining is smooth and without obvious irregularity.   2. There is no significant atheromatous plaque formation observed in the left common, internal or external carotid arteries. The intimal lining is smooth and without obvious irregularity.   3. The bilateral vertebral artery flow is antegrade.   4. The bilateral subclavian artery flow dynamics are within normal limits.    24 Hour BP Monitor 05/23/16  1. Elevated BPs.  2. Average BP 170/104, Average HR 102 bpm  3. Max. SBP 207, Max. DBP 132    Assessment:     [name] is 65 y.o. female with    CAD - c/o intermittent left-sided chest pain  Syncope - c/o continued dizziness with  postural change; Discussed possible Loop Recorder implant after having BP Monitor done  Valvular Heart Disease - c/o continued dizziness with postural change  HTN - Elevated BP on 24  Hour BP Monitor.  BP borderline today; Reports elevated BP readings at home in the 180's  Hyperlipidemia - On a statin; Encouraged heart healthy diet and exercise    Plan:     1. Continue current medications  2. Follow up in       Thank you for allowing me to participate in the care of your patients. Please feel free to contact me if there are further questions.     I am scribing for, and in the presence of Dr. Venita Lick, MD for services provided on 05/29/2016.  Hannah Beat, Scribe

## 2016-05-30 ENCOUNTER — Encounter (INDEPENDENT_AMBULATORY_CARE_PROVIDER_SITE_OTHER): Payer: Self-pay | Admitting: INTERNAL MEDICINE - CARDIOVASCULAR DISEASE

## 2016-05-30 ENCOUNTER — Ambulatory Visit: Payer: Commercial Managed Care - PPO | Attending: INTERNAL MEDICINE - CARDIOVASCULAR DISEASE

## 2016-05-30 ENCOUNTER — Ambulatory Visit (INDEPENDENT_AMBULATORY_CARE_PROVIDER_SITE_OTHER): Payer: Commercial Managed Care - PPO | Admitting: INTERNAL MEDICINE - CARDIOVASCULAR DISEASE

## 2016-05-30 VITALS — BP 115/79 | HR 94 | Resp 16 | Wt 200.0 lb

## 2016-05-30 DIAGNOSIS — I10 Essential (primary) hypertension: Secondary | ICD-10-CM

## 2016-05-30 DIAGNOSIS — R55 Syncope and collapse: Secondary | ICD-10-CM

## 2016-05-30 DIAGNOSIS — R079 Chest pain, unspecified: Secondary | ICD-10-CM

## 2016-05-30 DIAGNOSIS — E785 Hyperlipidemia, unspecified: Secondary | ICD-10-CM

## 2016-05-30 DIAGNOSIS — Z6833 Body mass index (BMI) 33.0-33.9, adult: Secondary | ICD-10-CM

## 2016-05-30 DIAGNOSIS — I2510 Atherosclerotic heart disease of native coronary artery without angina pectoris: Secondary | ICD-10-CM

## 2016-05-30 DIAGNOSIS — I38 Endocarditis, valve unspecified: Secondary | ICD-10-CM

## 2016-05-30 LAB — D-DIMER: D-DIMER: <200 ng/mL (ref <=230)

## 2016-05-30 NOTE — Progress Notes (Signed)
[facility] [place]  [address]  [place] [address]  [phone]    Date: 05/30/2016  Patient Name: [name]  MRN#: [id number]  DOB: [birth year]    Provider: Venita Lick, MD  PCP: Sid Falcon, MD       Reason for Visit: FOLLOWUP INDIVIDUAL THERAPY-MED CHECK (1 mo f/u hx CAD, valvular heart disease, syncope, hypertension, hyperlipidemia)    History:   [name] is a 65 y.o. woman with a history of CAD, Syncope, Valvular Heart Disease, HTN, and Hyperlipidemia.  She was last seen on 04-27-16 when she complained of continued episodes of dizziness with postural change, occasional palpitations, and elevated BP at home, therefore a 24 Hour BP Monitor was ordered and a future Loop Recorder implant was discussed pending her BP Monitor results.  Today she reports she continues to have intermittent symptoms, however reports she was started on Norvasc on Friday which has helped control her BP thus far.  She complains of occasional chest pain across the center of her chest that she describes as a pressure/pain that is bothersome yet not as severe as when she had her MI in 2014.  No c/o any SOB, palpitations, syncope/presyncope, orthopnea/PND, LE edema, or claudication.    Past Medical History:     Patient Active Problem List   Diagnosis    Coronary artery disease    HTN (hypertension)    Mixed hyperlipidemia    Essential hypertension    Hypokalemia    Chest pain    Syncope and collapse       Past Surgical History:     Past Surgical History:   Procedure Laterality Date    CORONARY ARTERY ANGIOPLASTY      HX CERVICAL DISKECTOMY      HX CERVICAL SPINE SURGERY      HX HEART CATHETERIZATION      stent in June 2014    HX HYSTERECTOMY      HX LAMINOPLASTY      HX TMJ ARTHOTOMY  x2     Allergies:     Allergies   Allergen Reactions    Capoten [Captopril]  Other Adverse Reaction (Add comment)     Hypotension    Demerol [Meperidine]     Dilaudid  [Hydromorphone] Mental Status Effect    Tramadol Diarrhea       Medications:     Current Outpatient Prescriptions   Medication Sig    amLODIPine (NORVASC) 10 mg Oral Tablet Take 1 Tab (10 mg total) by mouth Once a day    aspirin 81 mg Oral Tablet, Chewable Take 81 mg by mouth Once a day    calcium carbonate (TUMS) 200 mg calcium (500 mg) Oral Tablet, Chewable Take 1 Tab by mouth Once a day    CRESTOR 20 mg Oral Tablet TAKE 1 TABLET EVERY EVENING    cyclobenzaprine (FLEXERIL) 10 mg Oral Tablet Take 10 mg by mouth Three times a day    DOCUSATE CALCIUM (STOOL SOFTENER ORAL) Take by mouth    escitalopram oxalate (LEXAPRO) 10 mg Oral Tablet Take 10 mg by mouth Once a day    labetalol (NORMODYNE) 100 mg Oral Tablet Take 100 mg by mouth Twice daily    MULTIVITAMIN (MULTIPLE VITAMIN ORAL) Take by mouth       Family History:     Family Medical History     Problem Relation (Age of Onset)    Coronary Artery Disease Maternal Grandmother, Maternal Uncle  Diabetes Sister    Hypertension Sister, Maternal Grandmother          Social History:     Social History     Social History    Marital status: Widowed     Spouse name: N/A    Number of children: N/A    Years of education: N/A     Occupational History    Not on file.     Social History Main Topics    Smoking status: Never Smoker    Smokeless tobacco: Never Used    Alcohol use No    Drug use: No    Sexual activity: Not on file     Other Topics Concern    Not on file     Social History Narrative       Review of Systems:     All systems were reviewed and are negative other than noted in the HPI.    Physical Exam:   Blood pressure 115/79, pulse 94, resp. rate 16, weight 90.7 kg (200 lb), SpO2 93 %.    GENERAL: Grossly normal stature and nutrition. No problems noted.    HEENT: Head is normocephalic, atraumatic, without any gross head or neck masses. Ocular exam reveals EOMI and PERRLA. Neck   exam reveals neck is supple with no bruits.       NEUROLOGICAL/PSYCHOLOGICAL: No facial drooping, slurred speech, or altered motor function noted.    CARDIAC: Auscultation of the Heart is normal. There are no murmurs, clicks, rubs or gallop.    RESPIRATORY: Breath sounds clear and equal bilaterally. Normal chest wall excursion noted. No cough. No wheezing. Ausculation of   lungs reveal clear lung fields.    GASTROINTESTINAL: The abdomen is soft and nontender.    VASCULAR/EXTREMITIES: No clubing or cyanosis. No edema present bilateral lower extremities. No varicose veins.    SKIN: Inspection of skin shows no rash, lesions, temperature or color changes.    ABDOMEN: Abdomen soft, nontender, + bowel sounds present without palpable masses or bruits.     Cardiovascular Workup:     LHC 12/18/12  1. Successful percutaneous intervention with placement of a 3.0 x 24 mm Promus Element drug-eluting stent to the mid left anterior descending coronary artery with restoration of TIMI-3 flow.  2. Mildly reduced left ventricular systolic function.  3. Recent non-ST elevation myocardial infarction.    Nuclear Stress Test 08/27/15  1. Post-stress SPECT images estimated ejection fraction to be 62% and normal.  There is normal wall motion and normal wall ischemia.  2.  Following pharmacological stress testing, there does not appear to be any significant inducible ischemia or scar noted on myocardial perfusion imaging scan.  3.  Overall, this is a low risk nuclear stress test.  4.  Please correlate this finding as clinically based on sensitivity and specificity of this test.  Risk factor modification and treatment of comorbid conditions should continue, in order to reduce future cardiac morbidity and mortality.    Event Monitor 04/14-05/14/17  1. Sinus rhythm with occasional mild sinus tachycardia.  2. No significant arrhythmia, the patient's symptoms of chest pain did not correlate with any arrhythmia.    Echo 10/22/15  1. The left ventricle is thickened in a fashion consistent with  asymmetric septal hypertrophy. Grade I impaired relaxation. The LVEF is calculated in the apical four-chamber view to be 56%.   2. Diffuse thickening (sclerosis) without reduced excursion in the aortic valve. There is evidence of mild aortic regurgitation.   3.  There is annular calcification of the mitral valve. There is evidence of mild to moderate mitral regurgitation.   4. The Pulmonic Valve was grossly normal.   5. There is evidence of mild to moderate tricuspid regurgitation.   6. There is pericardial effusion of trivial size. There is no hemodynamic compromise.    Carotid Duplex 10/22/15  1. There is no significant atheromatous plaque formation observed in the right common, internal or external carotid arteries. The intimal lining is smooth and without obvious irregularity.   2. There is no significant atheromatous plaque formation observed in the left common, internal or external carotid arteries. The intimal lining is smooth and without obvious irregularity.   3. The bilateral vertebral artery flow is antegrade.   4. The bilateral subclavian artery flow dynamics are within normal limits.    Tilt Table Study 12/01/15  1. Negative tilt-table test for neurocardiogenic syncope.    24 Hour BP Monitor 05/23/16  1. Elevated BPs.  2. Average BP 170/104, Average HR 102 bpm  3. Max. SBP 207, Max. DBP 132    Assessment:     [name] is 65 y.o. woman with    CAD - c/o intermittent chest pain across the center of her chest  Syncope - c/o continued intermittent dizziness with postural change; Work-up thus far has been (-).  Valvular Heart Disease - Mild AI, Moderate MR, and Moderate TR on 09/2015 Echo.  c/o continued intermittent dizziness with postural change  HTN - Elevated BP on 24 Hour BP Monitor.  BP controlled today; Reports BP has been better controlled since starting Norvasc on Friday  Hyperlipidemia - On a statin; Encouraged heart healthy diet and exercise    Plan:     1. Continue current medications  2.  Order D-dimer to r/o PE  3. Follow up in 4 months      Thank you for allowing me to participate in the care of your patients. Please feel free to contact me if there are further questions.     I am scribing for, and in the presence of Dr. Venita Lickuna Juliyah Mergen, MD for services provided on 05/30/2016.  Hannah BeatKimberly Miller, Scribe    Hannah BeatKimberly Miller, Scribe 05/30/2016, 09:36

## 2016-06-11 ENCOUNTER — Other Ambulatory Visit: Payer: Self-pay

## 2016-06-14 NOTE — Progress Notes (Signed)
I am scribing for, and in the presence of Dr. Venita Lickuna Bhattacharya, MD for services provided on 04/27/2016.  Hannah BeatKimberly Dashaun Onstott, Scribe    Hannah BeatKimberly Nishanth Mccaughan, Scribe 06/14/2016, 08:42

## 2016-09-28 ENCOUNTER — Encounter (INDEPENDENT_AMBULATORY_CARE_PROVIDER_SITE_OTHER): Payer: Commercial Managed Care - PPO | Admitting: INTERNAL MEDICINE - CARDIOVASCULAR DISEASE

## 2016-09-29 ENCOUNTER — Encounter (INDEPENDENT_AMBULATORY_CARE_PROVIDER_SITE_OTHER): Payer: Commercial Managed Care - PPO | Admitting: Cardiovascular Disease

## 2016-09-29 ENCOUNTER — Other Ambulatory Visit (INDEPENDENT_AMBULATORY_CARE_PROVIDER_SITE_OTHER): Payer: Self-pay | Admitting: CARDIOVASCULAR DISEASE

## 2016-09-29 ENCOUNTER — Encounter (INDEPENDENT_AMBULATORY_CARE_PROVIDER_SITE_OTHER): Payer: Self-pay | Admitting: CARDIOVASCULAR DISEASE

## 2016-09-29 ENCOUNTER — Ambulatory Visit (INDEPENDENT_AMBULATORY_CARE_PROVIDER_SITE_OTHER): Payer: Commercial Managed Care - PPO | Admitting: CARDIOVASCULAR DISEASE

## 2016-09-29 VITALS — BP 132/79 | HR 103 | Ht 65.0 in | Wt 194.0 lb

## 2016-09-29 DIAGNOSIS — I2510 Atherosclerotic heart disease of native coronary artery without angina pectoris: Secondary | ICD-10-CM

## 2016-09-29 DIAGNOSIS — R079 Chest pain, unspecified: Secondary | ICD-10-CM

## 2016-09-29 DIAGNOSIS — M940 Chondrocostal junction syndrome [Tietze]: Secondary | ICD-10-CM

## 2016-09-29 DIAGNOSIS — R55 Syncope and collapse: Secondary | ICD-10-CM

## 2016-09-29 DIAGNOSIS — I10 Essential (primary) hypertension: Secondary | ICD-10-CM

## 2016-09-29 DIAGNOSIS — Z79899 Other long term (current) drug therapy: Secondary | ICD-10-CM

## 2016-09-29 DIAGNOSIS — Z6832 Body mass index (BMI) 32.0-32.9, adult: Secondary | ICD-10-CM

## 2016-09-29 MED ORDER — LABETALOL 100 MG TABLET
150.0000 mg | ORAL_TABLET | Freq: Two times a day (BID) | ORAL | 2 refills | Status: DC
Start: 2016-09-29 — End: 2016-12-28

## 2016-09-29 NOTE — Addendum Note (Signed)
Addended byRondell Reams on: 09/29/2016 12:56 PM     Modules accepted: Orders

## 2016-09-29 NOTE — Progress Notes (Addendum)
[facility]    Cardiology Clinic Follow-up note    PCP: Sid Falcon, MD  PRIMARY CARDIOLOGIST:  Dr. Barbara Cower  REASON FOR VISIT:   Intermittent syncope    SUBJECTIVE:  66 y.o. Black/African American female; who was seen in my clinic in July 2016 and then a switched over to Dr. Barbara Cower.  Patient the is now back in this office because Dr. Barbara Cower left the practice.  Prior to seeing this patient I informed her that this is a 1 time visit since I am leaving the practice.  I am just filling in for today.    66 year old female with known coronary disease, hypertension and chronic dizziness who was been following with Dr. is a more a and neurology in the past.  She has a history of tachycardia and previous Holter monitors have shown sinus tachycardia with occasional PVCs and PACs.  She was not beta-blockers in the past but then stop taking them and it is unclear why Dr. Barbara Cower did not resume them.  Patient states that this is a frequent event and previous monitoring has shown that she states tachycardic 24% of the times.  Patient reports that at times she has "blackout" spells where she has no recollection and falls on the floor and then gets up.  She has had 2 episodes in the last several months.  She has not injured herself.  She does not report any symptoms prior to this syncope.  She denies any seizure-like activity.  She does not have any urinary incontinence or fecal incontinence.  She did not bite her tongue.    ECG done the office today 09/29/2016 shows sinus tachycardia rate of 106 beats per minute, borderline criteria for left ventricular hypertrophy and poor progression of R-wave.  This possibility of an old inferior wall MI.    Past cardiac history:  PAST CARDIAC HISTORY: The patient is known to have coronary artery disease, status post non-ST myocardial infarction in 2014. On December 18, 2013, she underwent PCI placement of a 3 x 24 mm Promus Element drug-eluting  stent to the mid left anterior descending coronary artery. The left circumflex and right coronary had mild nonobstructive disease. EF by echocardiogram on December 17, 2012, had an EF of 55-60% with mild aortic insufficiency, grade I diastolic dysfunction.    According to Dr. Barbara Cower is note of 05/30/2016 the following workup has been done:  [facility] 12/18/12  1. Successful percutaneous intervention with placement of a 3.0 x 24 mm Promus Element drug-eluting stent to the mid left anterior descending coronary artery with restoration of TIMI-3 flow.  2. Mildly reduced left ventricular systolic function.  3. Recent non-ST elevation myocardial infarction.    Nuclear Stress Test 08/27/15  1. Post-stress SPECT images estimated ejection fraction to be 62% and normal.  There is normal wall motion and normal wall ischemia.  2.  Following pharmacological stress testing, there does not appear to be any significant inducible ischemia or scar noted on myocardial perfusion imaging scan.  3.  Overall, this is a low risk nuclear stress test.  4.  Please correlate this finding as clinically based on sensitivity and specificity of this test.  Risk factor modification and treatment of comorbid conditions should continue, in order to reduce future cardiac morbidity and mortality.    Event Monitor 04/14-05/14/17  1. Sinus rhythm with occasional mild sinus tachycardia.  2. No significant arrhythmia, the patient's symptoms of chest pain did not correlate with any arrhythmia.    Echo 10/22/15  1. The left ventricle is thickened in a fashion consistent with asymmetric septal hypertrophy. Grade I impaired relaxation. The LVEF is calculated in the apical four-chamber view to be 56%.   2. Diffuse thickening (sclerosis) without reduced excursion in the aortic valve. There is evidence of mild aortic regurgitation.   3. There is annular calcification of the mitral valve. There is evidence of mild to moderate mitral regurgitation.   4. The  Pulmonic Valve was grossly normal.   5. There is evidence of mild to moderate tricuspid regurgitation.   6. There is pericardial effusion of trivial size. There is no hemodynamic compromise.    Carotid Duplex 10/22/15  1. There is no significant atheromatous plaque formation observed in the right common, internal or external carotid arteries. The intimal lining is smooth and without obvious irregularity.   2. There is no significant atheromatous plaque formation observed in the left common, internal or external carotid arteries. The intimal lining is smooth and without obvious irregularity.   3. The bilateral vertebral artery flow is antegrade.   4. The bilateral subclavian artery flow dynamics are within normal limits.    Tilt Table Study 12/01/15  1. Negative tilt-table test for neurocardiogenic syncope.    24 Hour BP Monitor 05/23/16  1. Elevated BPs.  2. Average BP 170/104, Average HR 102 bpm  3. Max. SBP 207, Max. DBP 132    Social History     Social History   . Marital status: Widowed     Spouse name: N/A   . Number of children: N/A   . Years of education: N/A     Social History Main Topics   . Smoking status: Never Smoker   . Smokeless tobacco: Never Used   . Alcohol use No   . Drug use: No   . Sexual activity: Not on file     Other Topics Concern   . Not on file     Social History Narrative       Family Medical History     Problem Relation (Age of Onset)    Coronary Artery Disease Maternal Grandmother, Maternal Uncle    Diabetes Sister    Hypertension Sister, Maternal Grandmother          Current Outpatient Prescriptions   Medication Sig   . acetaminophen (TYLENOL) 500 mg Oral Tablet Take 500 mg by mouth Once per day as needed   . aspirin 81 mg Oral Tablet, Chewable Take 81 mg by mouth Once a day   . calcium carbonate (TUMS) 200 mg calcium (500 mg) Oral Tablet, Chewable Take 1 Tab by mouth Once a day   . CRESTOR 20 mg Oral Tablet TAKE 1 TABLET EVERY EVENING   . cyclobenzaprine (FLEXERIL) 10 mg Oral Tablet  Take 10 mg by mouth Three times a day   . DOCUSATE CALCIUM (STOOL SOFTENER ORAL) Take by mouth   . escitalopram oxalate (LEXAPRO) 10 mg Oral Tablet Take 10 mg by mouth Once a day   . labetalol (NORMODYNE) 100 mg Oral Tablet Take 1.5 Tabs (150 mg total) by mouth Twice daily for 30 days   . MULTIVITAMIN (MULTIPLE VITAMIN ORAL) Take by mouth   . [DISCONTINUED] labetalol (NORMODYNE) 100 mg Oral Tablet Take 100 mg by mouth Twice daily       Allergy:  Allergies   Allergen Reactions   . Capoten [Captopril]  Other Adverse Reaction (Add comment)     Hypotension   . Demerol [Meperidine]    . Dilaudid [  Hydromorphone] Mental Status Effect   . Hydromorphone Hcl    . Tramadol Diarrhea       Review of Systems:  General: No fevers, chills or weight change  Skin: no rashes, bruising reported  Respiratory: negative  Cardiovascular: As given above in HPI she does get a sharp left lateral chest wall discomfort that is not related to exertion.  It can be palpated and is reproducible.  No other associated symptoms with it.  It is continuous and mild.  Gastrointestinal: negative for dyspepsia, nausea and vomiting  ROS    EXAMINATION:    Body mass index is 32.28 kg/(m^2).  Vitals:    09/29/16 0935   BP: 132/79   Pulse: (!) 103   SpO2: 94%   Weight: 88 kg (194 lb)   Height: 1.651 m ( )     Body mass index is 32.28 kg/(m^2).    66 y.o. Black/African American female    Physical Exam  African American lady.  Obese.  Eyes:  Anicteric, extraocular motion intact  Head:  Atraumatic, oral mucosa moist  Neck:  No JVD, no carotid bruits, trachea central  Lungs:  Clear to auscultation with no crackles or wheezing  Cardiovascular:  Regular rate rhythm.  S1 and S2 are normal. I did not hear S3.  There is no significant murmur audible.  Chest wall:  Reproducible chest wall tenderness in the left lateral chest wall/lateral breast.  Patient states the that she has similar pain at home.  There was also some reproducibility in the anterior left mid  chest.  Abdomen:  Soft, not tender  Lower extremity:  No significant edema  Psych:  Anxious personality  Neurological:  Alert orient x3    DIAGNOSTIC DATA/ IMAGING:    ECG 09/29/2016 reviewed and given above in HPI    ASSESSMENT:       ICD-10-CM    1. Coronary artery disease involving native coronary artery of native heart without angina pectoris I25.10 EAST ECG (AMB ONLY)   2. Syncope, unspecified syncope type R55 EAST ECG (AMB ONLY)   3. HTN (hypertension), benign I10    4. Costochondritis M94.0 12 Lead ECG   5. Medication dose changed Z79.899        PLAN:   1. For better heart rate control will increase labetalol 150 mg b.i.d. This can be titrated through her PCP.  2. Refer to EP Cardiology for further assessment of syncope.  She may have to be considered for loop recorder.  Defer management to EP service.  3. For musculoskeletal chest wall discomfort I asked her to take a short course of NSAIDs and the I heating pad would be useful  4. I told her that she should not be driving, taking a bath in a tub or swimming; till her syncope has been completely worked up.  Patient is aware of this and has already received the same instructions from her previous cardiologist.    Patient is aware that I am seeing her for a single visit today.  I am leaving the practice soon, and she will be following with a cardiology provider as assigned by the practice manager here.  She of course will be followed by EP Cardiology as well.    Orders Placed This Encounter   . 12 Lead ECG   . EAST ECG (AMB ONLY)   . labetalol (NORMODYNE) 100 mg Oral Tablet         follow up: Return in about 4 months (around 01/29/2017).  Rondell Reams, MD, [facility], [facility]    Interventional Cardiology  [facility]  [facility]    Portions of this note may be dictated using voice recognition software or a dictation service. Variances in spelling and vocabulary are possible and unintentional. Not all errors are caught/corrected.  Please notify the Thereasa Parkin if any discrepancies are noted or if the meaning of any statement is not clear.     Note to Billing: Please review elements of this note to attribute correct coding as per [employer].

## 2016-09-29 NOTE — Procedures (Signed)
Dictated in clinic note

## 2016-10-19 ENCOUNTER — Encounter (INDEPENDENT_AMBULATORY_CARE_PROVIDER_SITE_OTHER): Payer: Self-pay | Admitting: CARDIOVASCULAR DISEASE

## 2016-10-19 NOTE — Nursing Note (Signed)
Referred by Dr. Annabell Sabal for an EP consult due to Syncope and consideration of Loop Recorder Implant.  She has a history of CAD, Syncope, Valvular Heart Disease, HTN, and Hyperlipidemia.  She was last seen by Dr. Annabell Sabal on 09-29-16 when she reported syncopal episodes and had a tachycardic HR, therefore Labetalol was increased to 150 mg BID and she was referred for an EP consult for evaluation of a Loop Recorder.    Dr. Servando Snare 09-29-16 Office Note:  66 y.o. Black/African American female; who was seen in my clinic in July 2016 and then a switched over to Dr. Barbara Cower.  Patient the is now back in this office because Dr. Barbara Cower left the practice.  Prior to seeing this patient I informed her that this is a 1 time visit since I am leaving the practice.  I am just filling in for today.    66 year old female with known coronary disease, hypertension and chronic dizziness who was been following with Dr. is a more a and neurology in the past.  She has a history of tachycardia and previous Holter monitors have shown sinus tachycardia with occasional PVCs and PACs.  She was not beta-blockers in the past but then stop taking them and it is unclear why Dr. Barbara Cower did not resume them.  Patient states that this is a frequent event and previous monitoring has shown that she states tachycardic 24% of the times.  Patient reports that at times she has "blackout" spells where she has no recollection and falls on the floor and then gets up.  She has had 2 episodes in the last several months.  She has not injured herself.  She does not report any symptoms prior to this syncope.  She denies any seizure-like activity.  She does not have any urinary incontinence or fecal incontinence.  She did not bite her tongue.    ----------    [facility] 12/18/12  1. Successful percutaneous intervention with placement of a 3.0 x 24 mm Promus Element drug-eluting stent to the mid left anterior descending coronary artery with restoration of  TIMI-3 flow.  2. Mildly reduced left ventricular systolic function.  3. Recent non-ST elevation myocardial infarction.    Nuclear Stress Test 08/27/15  1. Post-stress SPECT images estimated ejection fraction to be 62% and normal.  There is normal wall motion and normal wall ischemia.  2.  Following pharmacological stress testing, there does not appear to be any significant inducible ischemia or scar noted on myocardial perfusion imaging scan.  3.  Overall, this is a low risk nuclear stress test.  4.  Please correlate this finding as clinically based on sensitivity and specificity of this test.  Risk factor modification and treatment of comorbid conditions should continue, in order to reduce future cardiac morbidity and mortality.    Event Monitor 04/14-05/14/17  1. Sinus rhythm with occasional mild sinus tachycardia.  2. No significant arrhythmia, the patient's symptoms of chest pain did not correlate with any arrhythmia.    Echo 10/22/15  1. The left ventricle is thickened in a fashion consistent with asymmetric septal hypertrophy. Grade I impaired relaxation. The LVEF is calculated in the apical four-chamber view to be 56%.   2. Diffuse thickening (sclerosis) without reduced excursion in the aortic valve. There is evidence of mild aortic regurgitation.   3. There is annular calcification of the mitral valve. There is evidence of mild to moderate mitral regurgitation.   4. The Pulmonic Valve was grossly normal.   5. There is  evidence of mild to moderate tricuspid regurgitation.   6. There is pericardial effusion of trivial size. There is no hemodynamic compromise.    Carotid Duplex 10/22/15  1. There is no significant atheromatous plaque formation observed in the right common, internal or external carotid arteries. The intimal lining is smooth and without obvious irregularity.   2. There is no significant atheromatous plaque formation observed in the left common, internal or external carotid arteries. The  intimal lining is smooth and without obvious irregularity.   3. The bilateral vertebral artery flow is antegrade.   4. The bilateral subclavian artery flow dynamics are within normal limits.    Tilt Table Study 12/01/15  1. Negative tilt-table test for neurocardiogenic syncope.    24 Hour BP Monitor 05/23/16  1. Elevated BPs.  2. Average BP 170/104, Average HR 102 bpm  3. Max. SBP 207, Max. DBP 132    EKG 09/29/16  1. Sinus Tachycardia, rate of 106 beats per minute  2. Borderline criteria for LVH  3. Poor progression of R-wave.  4. Possibility of an old inferior wall MI.    -----------    CAD:  [facility] 12/18/12- Successful percutaneous intervention with placement of a 3.0 x 24 mm Promus Element drug-eluting stent to the mid left anterior descending coronary artery with restoration of TIMI-3 flow.  Low risk Nuc. Stress Test on 08/27/15.    Syncope:  Event Monitor 04/14-05/14/17 showed Sinus rhythm with occasional mild sinus tachycardia.  - Work-up thus far has been (-).    Valvular Heart Disease:  Mild AI, Moderate MR, and Moderate TR on 09/2015 Echo.    HTN:  Elevated BP on 24 Hour BP Monitor in 04/2016.    Hyperlipidemia:  No Lipid Panel on file.

## 2016-10-20 ENCOUNTER — Ambulatory Visit (INDEPENDENT_AMBULATORY_CARE_PROVIDER_SITE_OTHER): Payer: Commercial Managed Care - PPO | Admitting: CARDIOVASCULAR DISEASE

## 2016-10-20 ENCOUNTER — Encounter (INDEPENDENT_AMBULATORY_CARE_PROVIDER_SITE_OTHER): Payer: Self-pay | Admitting: CARDIOVASCULAR DISEASE

## 2016-10-20 VITALS — BP 142/100 | HR 104 | Ht 65.0 in | Wt 198.8 lb

## 2016-10-20 DIAGNOSIS — I10 Essential (primary) hypertension: Secondary | ICD-10-CM

## 2016-10-20 DIAGNOSIS — Z6833 Body mass index (BMI) 33.0-33.9, adult: Secondary | ICD-10-CM

## 2016-10-20 DIAGNOSIS — R55 Syncope and collapse: Principal | ICD-10-CM

## 2016-10-20 DIAGNOSIS — I2510 Atherosclerotic heart disease of native coronary artery without angina pectoris: Secondary | ICD-10-CM

## 2016-10-20 DIAGNOSIS — I38 Endocarditis, valve unspecified: Secondary | ICD-10-CM

## 2016-10-20 DIAGNOSIS — I951 Orthostatic hypotension: Secondary | ICD-10-CM

## 2016-10-20 NOTE — H&P (Signed)
[facility] [place]  [address]  [place] [address]  [phone]    Date: 10/20/2016  Patient Name: [name]  MRN#: [id number]  DOB: 06-11-1951    Provider: Tye Savoy, MD  PCP: Sid Falcon, MD      Reason for visit: New Patient (Syncope)    History:     [name] is a 66 y.o. female Referred by Dr. Annabell Sabal for an EP consult due to Syncope and consideration of Loop Recorder Implant.  She has a history of CAD, Syncope, Valvular Heart Disease, HTN, and Hyperlipidemia.  She was last seen by Dr. Annabell Sabal on 09-29-16 when she reported syncopal episodes and had a tachycardic HR, therefore Labetalol was increased to 150 mg BID and she was referred for an EP consult for evaluation of a Loop Recorder.  Today she reports she has been having syncopal episodes since April 1st, 2017 and has had multiple episodes since.  She does not recognize any symptoms leading to the syncopal episodes nor recognize a pattern to the events.  She states the last episode occurred about 2 months ago.  She reports her episodes usually occur when she is home.  She recalls of walking and briefly stopping before realizing she is on the ground with dizziness following the syncopal episodes.  She denies dizziness prior to her syncopal episodes.  She is wanting a PPM implant in hopes her syncope will resolve so she can be cleared to drive a vehicle again.  She states she does not drink a lot of water instead she consumes tea to stay hydrated throughout the day.  Orthostatic BP readings were done today in-office which was positive for Orthostatic Hypotension.  She states she has always had a tachycardic HR.  No c/o any CP, SOB, palpitations, orthopnea/PND, LE edema, or claudication.    Dr. Servando Snare 09-29-16 Office Note:  66 y.o.Black/African Americanfemale; who was seen in my clinic in July 2016 and then a switched over to Dr. Barbara Cower. Patient the is now back in this  office because Dr. Barbara Cower left the practice. Prior to seeing this patient I informed her that this is a 1 time visit since I am leaving the practice. I am just filling in for today.    65 year old female with known coronary disease, hypertension and chronic dizziness who was been following with Dr. is a more a and neurology in the past. She has a history of tachycardia and previous Holter monitors have shown sinus tachycardia with occasional PVCs and PACs. She was not beta-blockers in the past but then stop taking them and it is unclear why Dr. Barbara Cower did not resume them. Patient states that this is a frequent event and previous monitoring has shown that she states tachycardic 24% of the times. Patient reports that at times she has "blackout"spells where she has no recollection and falls on the floor and then gets up. She has had 2 episodes in the last several months. She has not injured herself. She does not report any symptoms prior to this syncope. She denies any seizure-like activity. She does not have any urinary incontinence or fecal incontinence. She did not bite her tongue.    Past Medical History:     Past Medical History:   Diagnosis Date   . Coronary artery disease    . Depression    . HTN (hypertension)    . Hyperlipemia    . Mixed hyperlipidemia 07/30/2013   . Obesity    .  Wears glasses          Past Surgical History:     Past Surgical History:   Procedure Laterality Date   . CORONARY ARTERY ANGIOPLASTY     . HX CERVICAL DISKECTOMY     . HX CERVICAL SPINE SURGERY     . HX HEART CATHETERIZATION      stent in June 2014   . HX HYSTERECTOMY     . HX LAMINOPLASTY     . HX TMJ ARTHOTOMY  x2         Allergies:     Allergies   Allergen Reactions   . Capoten [Captopril]  Other Adverse Reaction (Add comment)     Hypotension   . Demerol [Meperidine]    . Dilaudid [Hydromorphone] Mental Status Effect   . Hydromorphone Hcl    . Tramadol Diarrhea       Medications:     Current Outpatient  Prescriptions   Medication Sig   . acetaminophen (TYLENOL) 500 mg Oral Tablet Take 500 mg by mouth Once per day as needed   . aspirin 81 mg Oral Tablet, Chewable Take 81 mg by mouth Once a day   . calcium carbonate (TUMS) 200 mg calcium (500 mg) Oral Tablet, Chewable Take 1 Tab by mouth Once a day   . CRESTOR 20 mg Oral Tablet TAKE 1 TABLET EVERY EVENING   . cyclobenzaprine (FLEXERIL) 10 mg Oral Tablet Take 10 mg by mouth Three times a day   . DOCUSATE CALCIUM (STOOL SOFTENER ORAL) Take by mouth   . escitalopram oxalate (LEXAPRO) 10 mg Oral Tablet Take 10 mg by mouth Once a day   . labetalol (NORMODYNE) 100 mg Oral Tablet Take 1.5 Tabs (150 mg total) by mouth Twice daily for 30 days   . MULTIVITAMIN (MULTIPLE VITAMIN ORAL) Take by mouth       Family History:     Family Medical History     Problem Relation (Age of Onset)    Coronary Artery Disease Maternal Grandmother, Maternal Uncle    Diabetes Sister    Hypertension Sister, Maternal Grandmother            Social History:     Social History     Social History   . Marital status: Widowed     Spouse name: N/A   . Number of children: N/A   . Years of education: N/A     Social History Main Topics   . Smoking status: Never Smoker   . Smokeless tobacco: Never Used   . Alcohol use No   . Drug use: No   . Sexual activity: Not on file     Other Topics Concern   . Not on file     Social History Narrative       Review of Systems:     All systems were reviewed and are negative other than noted in the HPI.    Physical Exam:     Vitals:    10/20/16 0854   BP: (!) 142/100   Pulse: (!) 104   SpO2: 94%   Weight: 90.2 kg (198 lb 12.8 oz)   Height: 1.651 m ( )       Body mass index is 33.08 kg/(m^2).    Heart/Cardiovascular:  RRR  No murmurs, rubs, clicks, or gallops  2+ radial pulses    General: No acute distress  HEENT: Moist mucous membranes  Neck: Supple, no JVD, no bruits  Respiratory/Lungs: Non-labored, clear to  auscultation  Abdominal/Gastrointestinal: Soft, non-tender   Extremities: Warm and well-perfused, no edema x4  Skin: No rash  Neurological: Alert and oriented x3, grossly nonfocal  Psych: Normal affect    Cardiovascular Workup:     LHC 12/18/12  1. Successful percutaneous intervention with placement of a 3.0 x 24 mm Promus Element drug-eluting stent to the mid left anterior descending coronary artery with restoration of TIMI-3 flow.  2. Mildly reduced left ventricular systolic function.  3. Recent non-ST elevation myocardial infarction.    Nuclear Stress Test 08/27/15  1.  Post-stress SPECT images estimated ejection fraction to be 62% and normal. There is normal wall motion and normal wall ischemia.  2. Following pharmacological stress testing, there does not appear to be any significant inducible ischemia or scar noted on myocardial perfusion imaging scan.  3. Overall, this is a low risk nuclear stress test.  4. Please correlate this finding as clinically based on sensitivity and specificity of this test. Risk factor modification and treatment of comorbid conditions should continue, in order to reduce future cardiac morbidity and mortality.    Event Monitor 04/14-05/14/17  1. Sinus rhythm with occasional mild sinus tachycardia.  2. No significant arrhythmia, the patient's symptoms of chest pain did not correlate with any arrhythmia.    Echo 10/22/15  1. The left ventricle is thickened in a fashion consistent with asymmetric septal hypertrophy. Grade I impaired relaxation. The LVEF is calculated in the apical four-chamber view to be 56%.   2. Diffuse thickening (sclerosis) without reduced excursion in the aortic valve. There is evidence of mild aortic regurgitation.   3. There is annular calcification of the mitral valve. There is evidence of mild to moderate mitral regurgitation.   4. The Pulmonic Valve was grossly normal.   5. There is evidence of mild to moderate tricuspid regurgitation.   6. There is pericardial effusion of trivial size. There is no hemodynamic  compromise.    Carotid Duplex 10/22/15  1. There is no significant atheromatous plaque formation observed in the right common, internal or external carotid arteries. The intimal lining is smooth and without obvious irregularity.   2. There is no significant atheromatous plaque formation observed in the left common, internal or external carotid arteries. The intimal lining is smooth and without obvious irregularity.   3. The bilateral vertebral artery flow is antegrade.   4. The bilateral subclavian artery flow dynamics are within normal limits.    Tilt Table Study 12/01/15  1. Negative tilt-table test for neurocardiogenic syncope.    24 Hour BP Monitor 05/23/16  1. Elevated BPs.  2. Average BP 170/104, Average HR 102 bpm  3. Max. SBP 207, Max. DBP 132    EKG 09/29/16  1. Sinus Tachycardia, rate of 106 beats per minute  2. Borderline criteria for LVH  3. Poor progression of R-wave.  4. Possibility of an old inferior wall MI.    EKG 10/20/16  1. Sinus Tachycardia, HR 101 bpm  2. L. Axis deviation  3. Inferior infarct  4. Abnormal EKG    Orthostatic BP Readings 10/20/16  1. Laying down 160/101  2. Sitting 139/83  3. Standing 115/78  4. Positive for Orthostatic Hypotension    Assessment and Plan:     [name] is 66 y.o. female with      ICD-10-CM    1. Syncope, unspecified syncope type R55 EAST ECG (AMB ONLY)   2. Coronary artery disease involving native coronary artery of native heart without angina  pectoris I25.10    3. Valvular heart disease I38    4. Essential hypertension I10    5. Orthostatic hypotension I95.1        Syncope:  Onset of 09/25/15.  Event Monitor 04/14-05/14/17 showed Sinus rhythm with occasional mild sinus tachycardia.  EKG today shows Sinus Tachycardia.  - c/o occasional syncope since 09/24/16; Denies dizziness prior to episodes; It seems to be syncopal episodes, however she is orthostatic in the clinic without symptoms.  - TSH WNL  - Work-up thus far has been (-).  - Discussed  having an EP Study including the procedure, risks, and benefits; Explained she does not have a clear indication for PPM implant without an EP study  - Discussed Loop Recorder procedure, risks, and benefits; Explained the Loop Recorder will assess her rhythm at time of syncopal episodes  - I do not believe she would benefit from a Loop Recorder implant at this time given her positive orthostatic readings with a systolic difference of 45 mmHg from laying down to standing    Orthostatic Hypotension:  Orthostatic Readings today-  Laying down 160/101, Sitting 139/83, Standing 115/78  - Encouraged to stay well-hydrated drinking 4 glasses of water/day;   - Recommend future BP readings to be done when she is standing; If she is not orthostatic with no future presyncopal/syncopal episodes, then I would clear her to be able to drive again.    CAD:  LHC 12/18/12- Successful percutaneous intervention with placement of a 3.0 x 24 mm Promus Element drug-eluting stent to the mid left anterior descending coronary artery with restoration of TIMI-3 flow.  Low risk Nuc. Stress Test on 08/27/15.    Valvular Heart Disease:  Mild AI, Moderate MR, and Moderate TR on 09/2015 Echo.    HTN:  Elevated BP on 24 Hour BP Monitor in 04/2016.  - BP elevated today    Hyperlipidemia:  No Lipid Panel on file.  - Not on statin therapy      Follow up in 1 month with Dr. Annabell Sabal      Thank you for allowing me to participate in the care of your patient. Please feel free to contact me if there are further questions.     I am scribing for, and in the presence of Dr. Tye Savoy, MD for services provided on 10/20/2016.  Tonye Royalty, SCRIBE    Tonye Royalty, SCRIBE  10/20/2016, 09:56      I personally performed the services described in this documentation, as scribed in my presence, and it is both accurate and complete.    Tye Savoy, MD    Tye Savoy, MD  10/20/2016, 12:45

## 2016-11-21 ENCOUNTER — Encounter (INDEPENDENT_AMBULATORY_CARE_PROVIDER_SITE_OTHER): Payer: Commercial Managed Care - PPO | Admitting: CARDIOVASCULAR DISEASE

## 2016-11-22 ENCOUNTER — Ambulatory Visit (INDEPENDENT_AMBULATORY_CARE_PROVIDER_SITE_OTHER): Payer: Commercial Managed Care - PPO | Admitting: Cardiovascular Disease

## 2016-11-22 ENCOUNTER — Encounter (INDEPENDENT_AMBULATORY_CARE_PROVIDER_SITE_OTHER): Payer: Self-pay | Admitting: Cardiovascular Disease

## 2016-11-22 VITALS — BP 169/102 | HR 94 | Resp 17 | Wt 200.0 lb

## 2016-11-22 DIAGNOSIS — E782 Mixed hyperlipidemia: Secondary | ICD-10-CM

## 2016-11-22 DIAGNOSIS — Z6833 Body mass index (BMI) 33.0-33.9, adult: Secondary | ICD-10-CM

## 2016-11-22 DIAGNOSIS — R55 Syncope and collapse: Secondary | ICD-10-CM

## 2016-11-22 DIAGNOSIS — I10 Essential (primary) hypertension: Secondary | ICD-10-CM

## 2016-11-22 DIAGNOSIS — I2510 Atherosclerotic heart disease of native coronary artery without angina pectoris: Secondary | ICD-10-CM

## 2016-11-22 NOTE — Progress Notes (Signed)
[facility]ART [facility]ASC[facility] [place]  [address], [address]  [place] [address]  [phone]    Date: 11/22/2016  Patient Name: [name]  MRN#: [id number]  DOB: [birth year]    Provider: Wray Kearns, MD  PCP: Sid Falcon, MD    Reason for visit: Syncope (follow up )      History:   [name] is a 66 y.o. female with a history of syncope of unknown origin here for a follow up.  She has history of CAD with stents to the mid LAD in the past 2014 with NSTEMI.  She had work up for syncope including a negative tilt table.  She also had event recorder and did not have any arrhythmias except for tachycardia.  She has normal function on echo and carotid studies have been normal.  She saw EP in April and was diagnosed as having orthostatic hypotension.         She describes an episode as suddenly falling to the ground with no warning.  Episodes occur 3 months ago, previously 6 months before that.  Her very first episode was in April 2017.  She has no dizziness before episodes, she has no confusion after and has no loss of bowel or bladder function.         [name] has chest pain at times that occur at anytime.  She has musculosketal pain and takes meds for this.  She does admit to getting some shortness of breath on exertion.  She is limited on exertion due to bad knees.  She has no palpitations currently.  She says her heart beats fast all the time.  Her blood pressure is high today and she states it is high at home.  She is complaint with her medications at home.  She did not take her labetolol last night but she did take her does this am.  I rechecked bp and it was 148/88      Cardiovascular Workup:   1. Successful percutaneous intervention with placement of a 3.0 x 24 mm Promus Element drug-eluting stent to the mid left anterior descending coronary artery with restoration of TIMI-3 flow.  2. Mildly reduced left ventricular systolic function.  3. Recent non-ST elevation myocardial  infarction.    Nuclear Stress Test 08/27/15  1.  Post-stress SPECT images estimated ejection fraction to be 62% and normal. There is normal wall motion and normal wall ischemia.  2. Following pharmacological stress testing, there does not appear to be any significant inducible ischemia or scar noted on myocardial perfusion imaging scan.  3. Overall, this is a low risk nuclear stress test.  4. Please correlate this finding as clinically based on sensitivity and specificity of this test. Risk factor modification and treatment of comorbid conditions should continue, in order to reduce future cardiac morbidity and mortality.    Event Monitor 04/14-05/14/17  1. Sinus rhythm with occasional mild sinus tachycardia.  2. No significant arrhythmia, the patient's symptoms of chest pain did not correlate with any arrhythmia.    Echo 10/22/15  1. The left ventricle is thickened in a fashion consistent with asymmetric septal hypertrophy. Grade I impaired relaxation. The LVEF is calculated in the apical four-chamber view to be 56%.   2. Diffuse thickening (sclerosis) without reduced excursion in the aortic valve. There is evidence of mild aortic regurgitation.   3. There is annular calcification of the mitral valve. There is evidence of mild to moderate mitral regurgitation.   4. The  Pulmonic Valve was grossly normal.   5. There is evidence of mild to moderate tricuspid regurgitation.   6. There is pericardial effusion of trivial size. There is no hemodynamic compromise.    Carotid Duplex 10/22/15  1. There is no significant atheromatous plaque formation observed in the right common, internal or external carotid arteries. The intimal lining is smooth and without obvious irregularity.   2. There is no significant atheromatous plaque formation observed in the left common, internal or external carotid arteries. The intimal lining is smooth and without obvious irregularity.   3. The bilateral vertebral artery flow is  antegrade.   4. The bilateral subclavian artery flow dynamics are within normal limits.    Tilt Table Study 12/01/15  1. Negative tilt-table test for neurocardiogenic syncope.    24 Hour BP Monitor 05/23/16  1. Elevated BPs.  2. Average BP 170/104, Average HR 102 bpm  3. Max. SBP 207, Max. DBP 132    EKG 09/29/16  1. Sinus Tachycardia,rate of 106 beats per minute  2. Borderline criteria for LVH  3. Poor progression of R-wave.  4. Possibility of an old inferior wall MI.    EKG 10/20/16  1. Sinus Tachycardia, HR 101 bpm  2. L. Axis deviation  3. Inferior infarct  4. Abnormal EKG    Orthostatic BP Readings 10/20/16  1. Laying down 160/101  2. Sitting 139/83  3. Standing 115/78  4. Positive for Orthostatic Hypotension      Past Medical History:     Past Medical History:   Diagnosis Date    Coronary artery disease     Depression     HTN (hypertension)     Hyperlipemia     Mixed hyperlipidemia 07/30/2013    Obesity     Wears glasses            Past Surgical History:     Past Surgical History:   Procedure Laterality Date    CORONARY ARTERY ANGIOPLASTY      HX CERVICAL DISKECTOMY      HX CERVICAL SPINE SURGERY      HX HEART CATHETERIZATION      stent in June 2014    HX HYSTERECTOMY      HX LAMINOPLASTY      HX TMJ ARTHOTOMY  x2           Allergies:     Allergies   Allergen Reactions    Capoten [Captopril]  Other Adverse Reaction (Add comment)     Hypotension    Demerol [Meperidine]     Dilaudid [Hydromorphone] Mental Status Effect    Hydromorphone Hcl     Tramadol Diarrhea       Medications:     Current Outpatient Prescriptions   Medication Sig    acetaminophen (TYLENOL) 500 mg Oral Tablet Take 500 mg by mouth Once per day as needed    aspirin 81 mg Oral Tablet, Chewable Take 81 mg by mouth Once a day    calcium carbonate (TUMS) 200 mg calcium (500 mg) Oral Tablet, Chewable Take 1 Tab by mouth Once a day    CRESTOR 20 mg Oral Tablet TAKE 1 TABLET EVERY EVENING    cyclobenzaprine (FLEXERIL) 10  mg Oral Tablet Take 10 mg by mouth Three times a day    DOCUSATE CALCIUM (STOOL SOFTENER ORAL) Take by mouth    escitalopram oxalate (LEXAPRO) 10 mg Oral Tablet Take 10 mg by mouth Once a day    labetalol (NORMODYNE) 100 mg  Oral Tablet Take 1.5 Tabs (150 mg total) by mouth Twice daily for 30 days    MULTIVITAMIN (MULTIPLE VITAMIN ORAL) Take by mouth       Family History:     Family Medical History     Problem Relation (Age of Onset)    Coronary Artery Disease Maternal Grandmother, Maternal Uncle    Diabetes Sister    Hypertension Sister, Maternal Grandmother              Social History:     Social History     Social History    Marital status: Widowed     Spouse name: N/A    Number of children: N/A    Years of education: N/A     Social History Main Topics    Smoking status: Never Smoker    Smokeless tobacco: Never Used    Alcohol use No    Drug use: No    Sexual activity: Not on file     Other Topics Concern    Not on file     Social History Narrative       Review of Systems:     General:  No fevers, chills.  CARDIOVASCULAR: As per history of present illness.   RESPIRATORY: Negative.   GASTROINTESTINAL: Has acid reflux   HEMATOLOGICAL: No bruising, bleeding, petechiae reported.    Physical Exam:     Vitals:    11/22/16 0817   BP: (!) 169/102   Pulse: 94   Resp: 17   SpO2: 95%   Weight: 90.7 kg (200 lb)       GENERAL:  alert and oriented x3, no acute distress.  HEENT:  Head is atraumatic, normocephalic.  Eyes anicteric.  Oral mucosa is moist.  NECK:  Supple.  No JVD.  No carotid bruits.  Trachea central.   LUNGS:  Clear to auscultation.   No crackles.  No wheezing.  Adequate air entry.    CARDIOVASCULAR:  Regular rate and rhythm.  S1, S2, with no added sounds, rubs, murmur, or gallop.    ABDOMEN:  Soft, nontender.  No organomegaly. No bruits. No pulsatile mass.  LOWER EXTREMITY:  No edema.  Peripheral pulses palpable, bilateral, symmetrical.  Normal 2/2 volume.  PSYCH:  Appropriate  affect.  NEUROLOGICALLY:  Grossly intact.  Able to move all 4 limbs.  No focal motor deficits identified.    SKIN:  No rashes, bruises, petechia noted.  GU:  No flank tenderness.  The remainder of the GU exam was deferred.    Assessment:     [name] is 66 y.o. female with:  1. Syncope  2. Hypertension  3. Coronary artery disease  4. Hyperlipidemia    Plan:   1. Syncope.  She has had an extensive evaluation with no clear diagnosis. She was found to be orthostatic by EP but  Her episodes appear to be more [place].  I will refer for the loop recorder insertion.    2. Hypertension. Bp is a bit high.  Will monitor.    3. Coronary artery disease.  She has no symptoms and CAD is stable.  Continue meds and risk factor modification.      Thank you for allowing me to participate in the care of your patient. Please feel free to contact me if there are further questions.     I am scribing for, and in the presence of, Wray KearnsAnthony Pasqualina Colasurdo, MD for services provided on 11/22/2016.  Wray KearnsAnthony Adrianne Shackleton, MD       **  don't forget .[email or web address]**

## 2016-11-22 NOTE — Addendum Note (Signed)
Addended by: Launa GrillHARMAN, Anabela Crayton MARIE on: 11/22/2016 09:20 AM     Modules accepted: Orders

## 2016-11-23 ENCOUNTER — Telehealth (HOSPITAL_BASED_OUTPATIENT_CLINIC_OR_DEPARTMENT_OTHER): Payer: Self-pay | Admitting: Cardiovascular Disease

## 2016-11-24 ENCOUNTER — Telehealth (HOSPITAL_BASED_OUTPATIENT_CLINIC_OR_DEPARTMENT_OTHER): Payer: Self-pay | Admitting: Cardiovascular Disease

## 2016-11-24 NOTE — Telephone Encounter (Signed)
Patient contacted regarding scheduled loop recorder implantation on 11/29/16.  All instructions reviewed with patient, all questions answered.  Patient to arrive to registration @ 1130 on 11/29/16.  Patient provided with cath lab number should they have any additional questions.

## 2016-11-27 ENCOUNTER — Ambulatory Visit (HOSPITAL_BASED_OUTPATIENT_CLINIC_OR_DEPARTMENT_OTHER): Payer: Commercial Managed Care - PPO

## 2016-11-29 ENCOUNTER — Encounter (HOSPITAL_BASED_OUTPATIENT_CLINIC_OR_DEPARTMENT_OTHER): Payer: Self-pay

## 2016-11-29 ENCOUNTER — Ambulatory Visit (HOSPITAL_BASED_OUTPATIENT_CLINIC_OR_DEPARTMENT_OTHER)
Admission: RE | Admit: 2016-11-29 | Discharge: 2016-11-29 | Disposition: A | Discharge status: Home / Self Care | Payer: Commercial Managed Care - PPO | Source: Ambulatory Visit | Attending: Cardiovascular Disease | Admitting: Cardiovascular Disease

## 2016-11-29 ENCOUNTER — Inpatient Hospital Stay (HOSPITAL_BASED_OUTPATIENT_CLINIC_OR_DEPARTMENT_OTHER)
Admission: RE | Admit: 2016-11-29 | Discharge: 2016-11-29 | Disposition: A | Discharge status: Home / Self Care | Payer: Commercial Managed Care - PPO | Source: Ambulatory Visit | Attending: Cardiovascular Disease | Admitting: Cardiovascular Disease

## 2016-11-29 DIAGNOSIS — F329 Major depressive disorder, single episode, unspecified: Secondary | ICD-10-CM | POA: Insufficient documentation

## 2016-11-29 DIAGNOSIS — E669 Obesity, unspecified: Secondary | ICD-10-CM | POA: Insufficient documentation

## 2016-11-29 DIAGNOSIS — Z955 Presence of coronary angioplasty implant and graft: Secondary | ICD-10-CM | POA: Insufficient documentation

## 2016-11-29 DIAGNOSIS — Z885 Allergy status to narcotic agent status: Secondary | ICD-10-CM | POA: Insufficient documentation

## 2016-11-29 DIAGNOSIS — R55 Syncope and collapse: Secondary | ICD-10-CM

## 2016-11-29 DIAGNOSIS — Z6835 Body mass index (BMI) 35.0-35.9, adult: Secondary | ICD-10-CM | POA: Insufficient documentation

## 2016-11-29 DIAGNOSIS — I10 Essential (primary) hypertension: Secondary | ICD-10-CM | POA: Insufficient documentation

## 2016-11-29 DIAGNOSIS — I2510 Atherosclerotic heart disease of native coronary artery without angina pectoris: Secondary | ICD-10-CM | POA: Insufficient documentation

## 2016-11-29 DIAGNOSIS — Z7982 Long term (current) use of aspirin: Secondary | ICD-10-CM | POA: Insufficient documentation

## 2016-11-29 DIAGNOSIS — Z90710 Acquired absence of both cervix and uterus: Secondary | ICD-10-CM | POA: Insufficient documentation

## 2016-11-29 DIAGNOSIS — Z8249 Family history of ischemic heart disease and other diseases of the circulatory system: Secondary | ICD-10-CM | POA: Insufficient documentation

## 2016-11-29 DIAGNOSIS — E782 Mixed hyperlipidemia: Secondary | ICD-10-CM | POA: Insufficient documentation

## 2016-11-29 DIAGNOSIS — Z833 Family history of diabetes mellitus: Secondary | ICD-10-CM | POA: Insufficient documentation

## 2016-11-29 MED ORDER — LIDOCAINE (PF) 10 MG/ML (1 %) INJECTION SOLUTION
INTRAMUSCULAR | Status: DC
Start: 2016-11-29 — End: 2016-11-29
  Filled 2016-11-29: qty 30

## 2016-11-29 NOTE — Procedures (Signed)
[facility]  Procedure Report    Date Time: 11/29/2016 14:10  Patient Name: [name]  MRN#: [id number]  DOB: 1950-11-19    Preliminary report:     1. Successful implantation of a loop recorder for recurrent syncope.       Full report dictated.    Vitals:     HR:  [96] 96 (06/06 1145)  SpO2:  [97 %] 97 % (06/06 1145)

## 2016-11-29 NOTE — H&P (Signed)
[facility]  History & Physical    Date Time: 11/29/2016 14:07  Patient Name: [name]  MRN#: [id number]  DOB: 04-30-1951    Reason for Admission:   Loop recorder implantation for syncope.     History:   [name] is a 66 y.o. female with a PMH below who presents for a loop recorder implantation. Pt was last seen by Dr. Hubbard Robinson on 11/22/16 when he reported most recent episode of syncope/fall was about 3 months ago then 6 months before. She has undergone a negative tilt table study, an event monitor showing tachycardiac and normal echo and carotid duplex findings. She was recommended a loop recorder implantation. Risks, benefits and alternatives discussed and consent obtained.     Past Medical History:     Past Medical History:   Diagnosis Date   . Coronary artery disease    . Depression    . HTN (hypertension)    . Hyperlipemia    . Mixed hyperlipidemia 07/30/2013   . Obesity    . Wears glasses            Past Surgical History:     Past Surgical History:   Procedure Laterality Date   . CORONARY ARTERY ANGIOPLASTY     . HX CERVICAL DISKECTOMY     . HX CERVICAL SPINE SURGERY     . HX HEART CATHETERIZATION      stent in June 2014   . HX HYSTERECTOMY     . HX LAMINOPLASTY     . HX TMJ ARTHOTOMY  x2           Problem List:   Active Problems:    * No active hospital problems. *      Allergies:     Allergies   Allergen Reactions   . Capoten [Captopril]  Other Adverse Reaction (Add comment)     Hypotension   . Demerol [Meperidine]    . Dilaudid [Hydromorphone] Mental Status Effect   . Hydromorphone Hcl    . Tramadol Diarrhea       Medications:     Current Facility-Administered Medications   Medication Dose Route Frequency   . lidocaine (PF) 1% (10 mg/mL) injection ---Jaymes Graff         Prior to Admission medications    Medication Sig Start Date End Date Taking? Authorizing Provider   acetaminophen (TYLENOL) 500 mg Oral Tablet Take 500 mg by mouth Once per day as needed 04/07/16  Yes  Provider, Historical   aspirin 81 mg Oral Tablet, Chewable Take 81 mg by mouth Once a day   Yes Provider, Historical   calcium carbonate (TUMS) 200 mg calcium (500 mg) Oral Tablet, Chewable Take 1 Tab by mouth Once a day   Yes Provider, Historical   CRESTOR 20 mg Oral Tablet TAKE 1 TABLET EVERY EVENING 09/17/13  Yes Rondell Reams, MD   cyclobenzaprine (FLEXERIL) 10 mg Oral Tablet Take 10 mg by mouth Three times a day   Yes Provider, Historical   DOCUSATE CALCIUM (STOOL SOFTENER ORAL) Take by mouth    Provider, Historical   escitalopram oxalate (LEXAPRO) 10 mg Oral Tablet Take 10 mg by mouth Once a day   Yes Provider, Historical   labetalol (NORMODYNE) 100 mg Oral Tablet Take 1.5 Tabs (150 mg total) by mouth Twice daily for 30 days 09/29/16 11/29/16 Yes Rondell Reams, MD   MULTIVITAMIN (MULTIPLE VITAMIN ORAL) Take by mouth   Yes Provider, Historical  Family History:     Family Medical History     Problem Relation (Age of Onset)    Coronary Artery Disease Maternal Grandmother, Maternal Uncle    Diabetes Sister    Hypertension Sister, Maternal Grandmother              Social History:     Social History     Social History   . Marital status: Widowed     Spouse name: N/A   . Number of children: N/A   . Years of education: N/A     Occupational History   . Not on file.     Social History Main Topics   . Smoking status: Never Smoker   . Smokeless tobacco: Never Used   . Alcohol use No   . Drug use: No   . Sexual activity: Not on file     Other Topics Concern   . Not on file     Social History Narrative       Review of Systems:     All systems reviewed and negative other than those noted in the HPI.    Physical Exam:     Vitals:    11/29/16 1144 11/29/16 1145   Pulse:  96   SpO2:  97%   Weight: 90.7 kg (200 lb)    Height: 1.6 m (5\' 3" )      Body mass index is 35.43 kg/(m^2).    Intake and Output Summary (Last 24 hours) at Date Time  No intake or output data in the 24 hours ending 11/29/16 1407    General/Constitutional:  no acute distress  HEENT: moist mucous membranes  Neck: supple, no JVD  Heart/Cardiovascular:   RRR  no murmurs/rubs/gallops  2+ radial pulses  2+ femoral pulses  2+ DP pulses  2+ PT pulses  No bruits throughout  Respiratory/Lungs: non-labored, clear to auscultation throughout  Abdominal/Gastrointestinal: soft, nontender  Extremities: warm and well-perfused, no edema x4  Skin: no rash  Neurological: alert and oriented x3, grossly nonfocal  Psych: calm    Labs Reviewed:   BMP: No results found for this encounter  Magnesium: No results found for this encounter  CBC: No results found for this encounter  Hepatic Function: No results found for this encounter  Coags: No results found for this encounter  Cardiac Markers: No results found for this encounter  TSH:  No results found for this encounter  Lipids: No results found for this or any previous visit (from the past 16109 hour(s)).    Rads:        Cardiovascular Workup:     LHC 12/18/12  1. Successful percutaneous intervention with placement of a 3.0 x 24 mm Promus Element drug-eluting stent to the mid left anterior descending coronary artery with restoration of TIMI-3 flow.  2. Mildly reduced left ventricular systolic function.  3. Recent non-ST elevation myocardial infarction.    Nuclear Stress Test 08/27/15  1.  Post-stress SPECT images estimated ejection fraction to be 62% and normal. There is normal wall motion and normal wall ischemia.  2. Following pharmacological stress testing, there does not appear to be any significant inducible ischemia or scar noted on myocardial perfusion imaging scan.  3. Overall, this is a low risk nuclear stress test.  4. Please correlate this finding as clinically based on sensitivity and specificity of this test. Risk factor modification and treatment of comorbid conditions should continue, in order to reduce future cardiac morbidity and mortality.  Event Monitor 04/14-05/14/17  1. Sinus rhythm with occasional mild sinus  tachycardia.  2. No significant arrhythmia, the patient's symptoms of chest pain did not correlate with any arrhythmia.    Echo 10/22/15  1. The left ventricle is thickened in a fashion consistent with asymmetric septal hypertrophy. Grade I impaired relaxation. The LVEF is calculated in the apical four-chamber view to be 56%.   2. Diffuse thickening (sclerosis) without reduced excursion in the aortic valve. There is evidence of mild aortic regurgitation.   3. There is annular calcification of the mitral valve. There is evidence of mild to moderate mitral regurgitation.   4. The Pulmonic Valve was grossly normal.   5. There is evidence of mild to moderate tricuspid regurgitation.   6. There is pericardial effusion of trivial size. There is no hemodynamic compromise.    Carotid Duplex 10/22/15  1. There is no significant atheromatous plaque formation observed in the right common, internal or external carotid arteries. The intimal lining is smooth and without obvious irregularity.   2. There is no significant atheromatous plaque formation observed in the left common, internal or external carotid arteries. The intimal lining is smooth and without obvious irregularity.   3. The bilateral vertebral artery flow is antegrade.   4. The bilateral subclavian artery flow dynamics are within normal limits.    Tilt Table Study 12/01/15  1. Negative tilt-table test for neurocardiogenic syncope.    24 Hour BP Monitor 05/23/16  1. Elevated BPs.  2. Average BP 170/104, Average HR 102 bpm  3. Max. SBP 207, Max. DBP 132    EKG 09/29/16  1. Sinus Tachycardia,rate of 106 beats per minute  2. Borderline criteria for LVH  3. Poor progression of R-wave.  4. Possibility of an old inferior wall MI.    EKG 10/20/16  1. Sinus Tachycardia, HR 101 bpm  2. L. Axis deviation  3. Inferior infarct  4. Abnormal EKG    Orthostatic BP Readings 10/20/16  1. Laying down 160/101  2. Sitting 139/83  3. Standing 115/78  4. Positive for  Orthostatic Hypotension      Assessment:   [name] is a 66 y.o. female with    Syncope with recurrent near syncopal episodes    H/o:  Hypertension  Coronary artery disease  Hyperlipidemia    Plan:     1. Loop recorder implant; risks, benefits and alternatives discussed and consent obtained  2. Remain well hydrated discussed        Brayton CavesAjay Jennae Hakeem

## 2016-11-29 NOTE — Nurses Notes (Signed)
Dr. Drue SecondVirmani here at bedside.  Timeout completed.  Loop recorder placed without difficulty under local ansesthetic.  Programming completed by Garlon HatchetEric Higgonbotham, [employer] Rep.  Patient tolerated well.

## 2016-11-29 NOTE — Nurses Notes (Signed)
AVS reviewed with patient, all questions answered.  Patient discharged to home ambulatory.

## 2016-11-29 NOTE — Nurses Notes (Signed)
Pt transported to cath holding 5; attached to monitor; consents freely signed; admission questions completed; awaiting MD.

## 2016-11-29 NOTE — Discharge Summary (Signed)
[facility]  Discharge Summary    Date Time: 11/29/2016 14:12  Patient Name: [name]  MRN#: [id number]  DOB: 05-20-1951  Date of Admission:  11/29/2016  Date of Discharge:  11/29/2016      Discharge Diagnoses:   Loop recorder implantation for syncope.     Hospital Course:   [name] is a 66 y.o. female with a PMH below who presents for a loop recorder implantation. Pt was last seen by Dr. Hubbard RobinsonMcFarlane on 11/22/16 when he reported most recent episode of syncope/fall was about 3 months ago then 6 months before. She has undergone a negative tilt table study, an event monitor showing tachycardiac and normal echo and carotid duplex findings. She was recommended a loop recorder implantation. Risks, benefits and alternatives discussed. Loop recorder implanted. She was encouraged to drink decaffeinated beverages and follow up in 1-2 weeks in clinic.     Problem List:   Active Problems:    * No active hospital problems. *        Medications:        Current Discharge Medication List      CONTINUE these medications - NO CHANGES were made during your visit.       Details    acetaminophen 500 mg Tablet   Commonly known as:  TYLENOL    500 mg, Oral, Daily PRN   Refills:  0       aspirin 81 mg Tablet, Chewable    81 mg, Oral, Daily   Refills:  0       calcium carbonate 200 mg calcium (500 mg) Tablet, Chewable   Commonly known as:  TUMS    1 Tab, Oral, Daily   Refills:  0       CRESTOR 20 mg Tablet   Generic drug:  rosuvastatin    TAKE 1 TABLET EVERY EVENING   Qty:  90 Tab   Refills:  2       cyclobenzaprine 10 mg Tablet   Commonly known as:  FLEXERIL    10 mg, Oral, 3x/day   Refills:  0       escitalopram oxalate 10 mg Tablet   Commonly known as:  LEXAPRO    10 mg, Oral, Daily   Refills:  0       labetalol 100 mg Tablet   Commonly known as:  NORMODYNE    150 mg, Oral, 2x/day   Qty:  90 Tab   Refills:  2       MULTIPLE VITAMIN ORAL    Oral   Refills:  0       STOOL SOFTENER ORAL    Oral   Refills:  0              Physical Exam:   The patient was seen and examined on the day of discharge.    Filed Vitals:    11/29/16 1145   Pulse: 96   SpO2: 97%     Body mass index is 35.43 kg/(m^2).    Intake and Output Summary (Last 24 hours) at Date Time  No intake or output data in the 24 hours ending 11/29/16 1412    General/Constitutional: no acute distress  HEENT: moist mucous membranes  Neck: supple, no JVD  Heart/Cardiovascular:   RRR  no murmurs/rubs/gallops  2+ radial pulses  2+ femoral pulses  2+ DP pulses  2+ PT pulses  No bruits throughout  Respiratory/Lungs: non-labored, clear to auscultation throughout  Abdominal/Gastrointestinal: soft, nontender  Extremities: warm and well-perfused, no edema x4  Skin: no rash  Neurological: alert and oriented x3, grossly nonfocal  Psych: calm    Labs Reviewed:   BMP: No results found for this encounter  Magnesium: No results found for this encounter  CBC: No results found for this encounter  Hepatic Function: No results found for this encounter  Coags: No results found for this encounter  Cardiac Markers: No results found for this encounter  TSH:  No results found for this encounter  Lipids: No results found for this or any previous visit (from the past 96045 hour(s)).    Rads:          Discharge Condition: stable    Discharge Disposition:  home    Discharge Diet: heart-healthy (low fat, low cholesterol)  Discharge Activity: return to regular activity as tolerated per discharge instructions    Discharge follow-up:   Brayton Caves, MD  [address]  [address]  [place] [address]  [phone]    In 1 week           Additional discharge instructions:  The patient was instructed to call his physician or 911 as the situation dictates for any chest pain, shortness of breath, bleeding, or passing out.  Complete instructions are in the patient's After Visit Summary (AVS).      Primary Care Physician: Sid Falcon, MD   Brayton Caves

## 2016-11-30 NOTE — Nurses Notes (Signed)
Loop Implant Info:  [employer] Reveal Linq- [id number]  Serial #  [id number]  Exp. Date-  2017-07-09

## 2016-12-04 ENCOUNTER — Telehealth (HOSPITAL_BASED_OUTPATIENT_CLINIC_OR_DEPARTMENT_OTHER): Payer: Self-pay | Admitting: Cardiovascular Disease

## 2016-12-04 NOTE — Telephone Encounter (Signed)
Patient seen in Cardiac Cath Lab for loop recorder. Returned call for follow-up. States that she is doing well and received all post procedure instructions. No questions or concerns at this time. Instructed to contact our office or Cardiology office for questions or concerns.    Maximiano CossJacqueline Creta Dorame, RTR  12/04/2016, 12:30

## 2016-12-04 NOTE — Telephone Encounter (Signed)
Patient seen in CCL. Attempted to contact for a follow-up, no answer at this time. LVM to return call.  Maximiano CossJacqueline Xabi Wittler, RTR  12/04/2016, 12:24

## 2016-12-07 ENCOUNTER — Encounter (INDEPENDENT_AMBULATORY_CARE_PROVIDER_SITE_OTHER): Payer: Self-pay | Admitting: Cardiovascular Disease

## 2016-12-07 NOTE — Nursing Note (Signed)
History:     [name] is a 66 y.o. female who is here for a hospital follow up. She was last seen by Dr. Hubbard Robinson on 11/22/16 when he reported most recent episode of syncope/fall was about 3 months ago then 6 months before. She has undergone a negative tilt table study, an event monitor showing tachycardiac and normal echo and carotid duplex findings. She was recommended a loop recorder implantation. Risks, benefits and alternatives discussed. Loop recorder implanted. She was encouraged to drink decaffeinated beverages and follow up in 1-2 weeks in clinic.     Cardiovascular Workup:     LHC 12/18/12  1. Successful percutaneous intervention with placement of a 3.0 x 24 mm Promus Element drug-eluting stent to the mid left anterior descending coronary artery with restoration of TIMI-3 flow.  2. Mildly reduced left ventricular systolic function.  3. Recent non-ST elevation myocardial infarction.    Nuclear Stress Test 08/27/15  1. Post-stress SPECT images estimated ejection fraction to be 62% and normal. There is normal wall motion and normal wall ischemia.  2. Following pharmacological stress testing, there does not appear to be any significant inducible ischemia or scar noted on myocardial perfusion imaging scan.  3. Overall, this is a low risk nuclear stress test.  4. Please correlate this finding as clinically based on sensitivity and specificity of this test. Risk factor modification and treatment of comorbid conditions should continue, in order to reduce future cardiac morbidity and mortality.    Event Monitor 04/14-05/14/17  1. Sinus rhythm with occasional mild sinus tachycardia.  2. No significant arrhythmia, the patient's symptoms of chest pain did not correlate with any arrhythmia.    Echo 10/22/15  1. The left ventricle is thickened in a fashion consistent with asymmetric septal hypertrophy. Grade I impaired relaxation. The LVEF is calculated in the apical four-chamber view to be 56%.    2. Diffuse thickening (sclerosis) without reduced excursion in the aortic valve. There is evidence of mild aortic regurgitation.   3. There is annular calcification of the mitral valve. There is evidence of mild to moderate mitral regurgitation.   4. The Pulmonic Valve was grossly normal.   5. There is evidence of mild to moderate tricuspid regurgitation.   6. There is pericardial effusion of trivial size. There is no hemodynamic compromise.    Carotid Duplex 10/22/15  1. There is no significant atheromatous plaque formation observed in the right common, internal or external carotid arteries. The intimal lining is smooth and without obvious irregularity.   2. There is no significant atheromatous plaque formation observed in the left common, internal or external carotid arteries. The intimal lining is smooth and without obvious irregularity.   3. The bilateral vertebral artery flow is antegrade.   4. The bilateral subclavian artery flow dynamics are within normal limits.    Tilt Table Study 12/01/15  1. Negative tilt-table test for neurocardiogenic syncope.    24 Hour BP Monitor 05/23/16  1. Elevated BPs.  2. Average BP 170/104, Average HR 102 bpm  3. Max. SBP 207, Max. DBP 132    EKG 09/29/16  1. Sinus Tachycardia,rate of 106 beats per minute  2. Borderline criteria for LVH  3. Poor progression of R-wave.  4. Possibility of an old inferior wall MI.    EKG 10/20/16  1. Sinus Tachycardia, HR 101 bpm  2. L. Axis deviation  3. Inferior infarct  4. Abnormal EKG    Orthostatic BP Readings 10/20/16  1. Laying down 160/101  2. Sitting 139/83  3. Standing 115/78  4. Positive for Orthostatic Hypotension    ILR implantation 11/29/16    Assessment:     Syncope with recurrent near syncopal episodes. Now s/p [employer] ILR    HTN    CAD: LHC 12/18/12 Successful PCI with DES to mid LAD. Nuclear stress. Nuclear stress 08/27/15 Post-stress SPECT images estimated ejection fraction to be 62% and normal. There is normal  wall motion and normal wall ischemia. Following pharmacological stress testing, there does not appear to be any significant inducible ischemia or scar noted on myocardial perfusion imaging scan. Overall, this is a low risk nuclear stress test.    Hyperlipidemia

## 2016-12-28 ENCOUNTER — Ambulatory Visit (INDEPENDENT_AMBULATORY_CARE_PROVIDER_SITE_OTHER): Payer: Commercial Managed Care - PPO | Admitting: Cardiovascular Disease

## 2016-12-28 ENCOUNTER — Ambulatory Visit (INDEPENDENT_AMBULATORY_CARE_PROVIDER_SITE_OTHER): Payer: Commercial Managed Care - PPO

## 2016-12-28 ENCOUNTER — Encounter (INDEPENDENT_AMBULATORY_CARE_PROVIDER_SITE_OTHER): Payer: Self-pay | Admitting: Cardiovascular Disease

## 2016-12-28 VITALS — BP 155/90 | HR 94 | Resp 18 | Ht 65.0 in | Wt 197.0 lb

## 2016-12-28 DIAGNOSIS — Z6832 Body mass index (BMI) 32.0-32.9, adult: Secondary | ICD-10-CM

## 2016-12-28 DIAGNOSIS — R55 Syncope and collapse: Secondary | ICD-10-CM

## 2016-12-28 DIAGNOSIS — I10 Essential (primary) hypertension: Secondary | ICD-10-CM

## 2016-12-28 DIAGNOSIS — E782 Mixed hyperlipidemia: Secondary | ICD-10-CM

## 2016-12-28 DIAGNOSIS — I2510 Atherosclerotic heart disease of native coronary artery without angina pectoris: Secondary | ICD-10-CM

## 2016-12-28 MED ORDER — LABETALOL 200 MG TABLET: 200 mg | Tab | Freq: Two times a day (BID) | ORAL | 1 refills | 0 days | Status: AC

## 2016-12-28 NOTE — Progress Notes (Signed)
[facility] [place]  [address]  [address]  [place] [address]  [phone]    Date: 12/28/2016  Patient Name: [name]  MRN#: [id number]  DOB: March 10, 1951    Provider: Brayton Caves, MD  PCP: Sid Falcon, MD      Reason for visit: Heart Disease    History:     [name] is a 66 y.o. female female who is here for a hospital follow up. She was last seen by Dr. Hubbard Robinson on 11/22/16 when he reported recurrent syncopal episodes. Given her unremarkable work-up thus far, INR implant was discussed and successfully done on 11/29/16. Today she reports she feels her heart racing occasionally. She is working on drinking more fluids. She does not monitor her BP at home. She reports numbness and pain in her feet. No c/o any CP, SOB, dizziness, presyncope, syncope, orthopnea, edema, PND, or claudication symptoms.     Past Medical History:     Past Medical History:   Diagnosis Date   . Coronary artery disease    . Depression    . HTN (hypertension)    . Hyperlipemia    . Mixed hyperlipidemia 07/30/2013   . Obesity    . Wears glasses          Past Surgical History:     Past Surgical History:   Procedure Laterality Date   . CORONARY ARTERY ANGIOPLASTY     . HX CERVICAL DISKECTOMY     . HX CERVICAL SPINE SURGERY     . HX HEART CATHETERIZATION      stent in June 2014   . HX HYSTERECTOMY     . HX LAMINOPLASTY     . HX TMJ ARTHOTOMY  x2         Allergies:     Allergies   Allergen Reactions   . Capoten [Captopril]  Other Adverse Reaction (Add comment)     Hypotension   . Demerol [Meperidine]    . Dilaudid [Hydromorphone] Mental Status Effect   . Hydromorphone Hcl    . Tramadol Diarrhea       Medications:     Current Outpatient Prescriptions   Medication Sig   . acetaminophen (TYLENOL) 500 mg Oral Tablet Take 500 mg by mouth Once per day as needed   . aspirin 81 mg Oral Tablet, Chewable Take 81 mg by mouth Once a day   . calcium carbonate (TUMS) 200 mg calcium (500 mg) Oral Tablet, Chewable  Take 1 Tab by mouth Once a day   . CRESTOR 20 mg Oral Tablet TAKE 1 TABLET EVERY EVENING   . cyclobenzaprine (FLEXERIL) 10 mg Oral Tablet Take 10 mg by mouth Three times a day   . DOCUSATE CALCIUM (STOOL SOFTENER ORAL) Take by mouth   . escitalopram oxalate (LEXAPRO) 10 mg Oral Tablet Take 10 mg by mouth Once a day   . labetalol (NORMODYNE) 100 mg Oral Tablet Take 150 mg by mouth Twice daily   . MULTIVITAMIN (MULTIPLE VITAMIN ORAL) Take by mouth   . [DISCONTINUED] labetalol (NORMODYNE) 100 mg Oral Tablet Take 1.5 Tabs (150 mg total) by mouth Twice daily for 30 days       Family History:     Family Medical History     Problem Relation (Age of Onset)    Coronary Artery Disease Maternal Grandmother, Maternal Uncle    Diabetes Sister    Hypertension Sister, Maternal Grandmother  Social History:     Social History     Social History   . Marital status: Widowed     Spouse name: N/A   . Number of children: N/A   . Years of education: N/A     Occupational History   . retired      Social History Main Topics   . Smoking status: Never Smoker   . Smokeless tobacco: Never Used   . Alcohol use No   . Drug use: No   . Sexual activity: Not on file     Other Topics Concern   . Not on file     Social History Narrative       Review of Systems:     All systems were reviewed and are negative other than noted in the HPI.    Physical Exam:     Vitals:    12/28/16 1318   BP: (!) 155/90   Pulse: 94   Resp: 18   SpO2: 96%   Weight: 89.4 kg (197 lb)   Height: 1.651 m (5\' 5" )     Body mass index is 32.78 kg/(m^2).    Cardiovascular:  RRR  SEM  No rubs, clicks, or gallops    Pulse exam:  2+ radial pulses  2+ femoral pulses  2+ DP pulses  1+ PT pulses    Extremities: Warm and well-perfused, no edema    Neck: Supple, no JVD, no bruits  General: Awake, alert, and in no acute distress  HEENT: Moist mucous membranes  Respiratory: Clear to auscultation  Abdominal/Gastrointestinal: Soft, non-tender, no masses, no organomegaly  Skin: Non  jaundiced, no rashes  Neurological: Grossly nonfocal  Psychiatric: Normal affect    Cardiovascular Workup:     Coronary angioplasty 12/18/12  1. Successful percutaneous intervention with placement of a 3.0 x 24 mm Promus Element drug-eluting stent to the mid left anterior descending coronary artery with restoration of TIMI-3 flow.  2. Mildly reduced left ventricular systolic function.  3. Recent non-ST elevation myocardial infarction.    Nuclear Stress Test 08/27/15  1. Post-stress SPECT images estimated ejection fraction to be 62% and normal. There is normal wall motion and normal wall ischemia.  2. Following pharmacological stress testing, there does not appear to be any significant inducible ischemia or scar noted on myocardial perfusion imaging scan.  3. Overall, this is a low risk nuclear stress test.  4. Please correlate this finding as clinically based on sensitivity and specificity of this test. Risk factor modification and treatment of comorbid conditions should continue, in order to reduce future cardiac morbidity and mortality.    Event Monitor 4/14-5/14/17  1. Sinus rhythm with occasional mild sinus tachycardia.  2. No significant arrhythmia, the patient's symptoms of chest pain did not correlate with any arrhythmia.    Echo 10/22/15  1. The left ventricle is thickened in a fashion consistent with asymmetric septal hypertrophy. Grade I impaired relaxation. The LVEF is calculated in the apical four-chamber view to be 56%.   2. Diffuse thickening (sclerosis) without reduced excursion in the aortic valve. There is evidence of mild aortic regurgitation.   3. There is annular calcification of the mitral valve. There is evidence of mild to moderate mitral regurgitation.   4. The Pulmonic Valve was grossly normal.   5. There is evidence of mild to moderate tricuspid regurgitation.   6. There is pericardial effusion of trivial size. There is no hemodynamic compromise.    Carotid Duplex 10/22/15  1.  There is no significant atheromatous plaque formation observed in the right common, internal or external carotid arteries. The intimal lining is smooth and without obvious irregularity.   2. There is no significant atheromatous plaque formation observed in the left common, internal or external carotid arteries. The intimal lining is smooth and without obvious irregularity.   3. The bilateral vertebral artery flow is antegrade.   4. The bilateral subclavian artery flow dynamics are within normal limits.    Tilt Table Study 12/01/15  1. Negative tilt-table test for neurocardiogenic syncope.    24 Hour BP Monitor 05/23/16  1. Elevated BPs.  2. Average BP 170/104, Average HR 102 bpm  3. Max. SBP 207, Max. DBP 132    EKG 09/29/16  1. Sinus Tachycardia,rate of 106 beats per minute  2. Borderline criteria for LVH  3. Poor progression of R-wave.  4. Possibility of an old inferior wall MI.    Orthostatic BP Readings 10/20/16  1. Laying down 160/101  2. Sitting 139/83  3. Standing 115/78  4. Positive for Orthostatic Hypotension    S/p ILR implantation 11/29/16    Assessment:     [name] is a 66 y.o. female with    Syncope with recurrent near syncopal episodes s/p Medtronic ILR    CAD: Coronary angioplasty 12/18/12 Successful PCI with DES to mid LAD. - Nuclear stress 08/27/15 Post-stress SPECT images estimated ejection fraction to be 62% and normal. There is normal wall motion and normal wall ischemia. Following pharmacological stress testing, there does not appear to be any significant inducible ischemia or scar noted on myocardial perfusion imaging scan. Overall, this is a low risk nuclear stress test.    HTN: elevated today,150 systolic on recheck    ?AFib vs PSVT, noted on LINQ alert today   - CHA2DS2-VASc of 3 (HTN, female, age)     Hyperlipidemia: no recent FLP on file     Foot pain, likely neuropathy     Nonsmoker    Plan:     1. Will continue to follow ILR  2. Encouraged to say well-hydrated   3. Continue  ASA, statin  4. Encouraged to monitor BP at home   5. Increase labetalol to 200 mg BID       Thank you for allowing me to participate in the care of your patient. Please feel free to contact me if there are further questions.     I am scribing for, and in the presence of, Dr. Brayton CavesAjay Montserrat Shek, MD for services provided on 12/28/2016.  Irving Burtonmily Marchessault     Emily Marchessault  12/28/2016, 13:34    I personally performed the services described in this documentation, as scribed in my presence, and it is both accurate and complete.    Brayton CavesAjay Arush Gatliff, MD    Brayton CavesAjay Karver Fadden, MD  12/28/2016, 13:38

## 2017-01-01 ENCOUNTER — Encounter (INDEPENDENT_AMBULATORY_CARE_PROVIDER_SITE_OTHER): Payer: Self-pay | Admitting: Cardiovascular Disease

## 2017-01-04 NOTE — Procedures (Signed)
[facility] [place]  [address]  [address]  [place] [id number]  [phone]       Date: 12/28/16  Patient Name: [name]  MRN#: [id number]  DOB: [birth year]    Remote ILR Device Interrogation    Model/SN Medtronic Reveal LINQ [id number]  Implant date November 29, 2016  Loop Recorder    Patient's ILR was checked by remote access.     30 day analysis was performed of patient's implantable loop recorder, including analysis of programmed parameters, battery, heart rhythm, counters, any observation summaries, and alerts.  Please see programmer printout for further details (scanned document).    Battery   OK    Events/Episodes   1 tachy/?afib episode, HR 167bpm, duration 8 minutes and 51 seconds.    Diagnostic observations   As above, continue to follow for more possible Afib episodes      Data   - no prior h/o Afib  - Echo 10/22/15 The left ventricle is thickened in a fashion consistent with asymmetric septal hypertrophy. Grade I impaired relaxation. The LVEF is calculated in the apical four-chamber view to be 56%.  - Nuclear Stress Test 08/27/15 Post-stress SPECT images estimated ejection fraction to be 62% and normal. There is normal wall motion and normal wall ischemia.Following pharmacological stress testing, there does not appear to be any significant inducible ischemia or scar noted on myocardial perfusion imaging scan  - TSH 1.6 11/2015  - K 4.0 04/2016  - Mg 1.6 11/2015    Medications: labetalol 200 mg BID (increased after the above episode), ASA 81 mg daily       Follow up in 1 month, remote

## 2017-01-11 ENCOUNTER — Other Ambulatory Visit (INDEPENDENT_AMBULATORY_CARE_PROVIDER_SITE_OTHER): Payer: Self-pay | Admitting: CARDIOVASCULAR DISEASE

## 2017-01-25 ENCOUNTER — Ambulatory Visit (INDEPENDENT_AMBULATORY_CARE_PROVIDER_SITE_OTHER): Payer: Commercial Managed Care - PPO

## 2017-01-25 DIAGNOSIS — R55 Syncope and collapse: Secondary | ICD-10-CM

## 2017-01-28 DIAGNOSIS — R55 Syncope and collapse: Secondary | ICD-10-CM

## 2017-02-13 NOTE — Procedures (Signed)
[facility]ASC[facility] [place]  [address]  [address]  [place] [address]  [phone]       Date: 01/25/17  Patient Name: [name]  MRN#: [id number]  DOB: 06-21-1951    Remote ILR Device Interrogation    Model/SN Medtronic Reveal LINQ [id number]  Implant date November 29, 2016  Loop Recorder    Patient's ILR was checked by remote access.     30 day analysis was performed of patient's implantable loop recorder, including analysis of programmed parameters, battery, heart rhythm, counters, any observation summaries, and alerts.  Please see programmer printout for further details (scanned document).    Battery   OK    Events/Episodes   None.    Diagnostic observations   No new observations.     Follow up in 1 month, remote

## 2017-02-27 ENCOUNTER — Ambulatory Visit (INDEPENDENT_AMBULATORY_CARE_PROVIDER_SITE_OTHER): Payer: Commercial Managed Care - PPO

## 2017-02-27 DIAGNOSIS — R55 Syncope and collapse: Secondary | ICD-10-CM

## 2017-03-01 NOTE — Procedures (Signed)
[facility] [place]  [address]  [address]  [place] [id number]  [phone]       Date: 02/27/17  Patient Name: [name]  MRN#: [id number]  DOB: Jan 01, 1951    Remote ILR Device Interrogation    Model/SN Medtronic Reveal LINQ [id number]  Implant date November 29, 2016  Loop Recorder    Patient's ILR was checked by remote access.     30 day analysis was performed of patient's implantable loop recorder, including analysis of programmed parameters, battery, heart rhythm, counters, any observation summaries, and alerts.  Please see programmer printout for further details (scanned document).    Battery   OK    Events/Episodes   None.    Diagnostic observations   No new observations.     Follow up in 1 month, remote

## 2017-03-23 ENCOUNTER — Telehealth (INDEPENDENT_AMBULATORY_CARE_PROVIDER_SITE_OTHER): Payer: Self-pay | Admitting: Cardiovascular Disease

## 2017-03-23 NOTE — Nursing Note (Signed)
Received ILR alert showing ?VT per Dr Drue Second. He discussed this with Dr Kerrie Pleasure, who recommended an EP study. Attempted to call pt to schedule appointment with Dr Drue Second to discuss this, LVM. Pt's chart indicates that she may have moved and established care in [place]. Attempted to call [facility] in [place], [place]. LVM for them to call back.     [name]  03/23/2017, 11:09

## 2017-03-23 NOTE — Telephone Encounter (Signed)
Pt's new cardiologist's office called back, discussed ILR finding with them and they stated that they would follow up on this her. Will transfer [facility] monitoring to them.      Irving Burton Carnisha Feltz  03/23/2017, 11:26

## 2017-03-26 ENCOUNTER — Ambulatory Visit (INDEPENDENT_AMBULATORY_CARE_PROVIDER_SITE_OTHER): Payer: Commercial Managed Care - PPO

## 2017-03-26 DIAGNOSIS — R55 Syncope and collapse: Secondary | ICD-10-CM

## 2017-03-30 NOTE — Procedures (Signed)
[facility]ASC[facility] [place]  [address]  [address]  [place] [address]  [phone]       Date: 03/29/17  Patient Name: Shallen Luedke  MRN#: [id number]  DOB: [birth year]    Remote ILR Device Interrogation    Model/SN Medtronic Reveal LINQ [id number]  Implant date November 29, 2016  Loop Recorder    Patient's ILR was checked by remote access.     30 day analysis was performed of patient's implantable loop recorder, including analysis of programmed parameters, battery, heart rhythm, counters, any observation summaries, and alerts.  Please see programmer printout for further details (scanned document).    Battery   OK    Events/Episodes   304 AT episodes, only strip available appears to be VT.      Diagnostic observations   Recommended EP study. Pt moved to [place], pt's new cardiologist aware and patient aware. [facility] monitoring transferred to them.     Follow up in 1 month, remote

## 2017-04-02 ENCOUNTER — Encounter (INDEPENDENT_AMBULATORY_CARE_PROVIDER_SITE_OTHER): Payer: Self-pay | Admitting: Cardiovascular Disease

## 2017-04-12 ENCOUNTER — Encounter (INDEPENDENT_AMBULATORY_CARE_PROVIDER_SITE_OTHER): Payer: Self-pay | Admitting: Cardiovascular Disease

## 2017-04-27 ENCOUNTER — Ambulatory Visit (INDEPENDENT_AMBULATORY_CARE_PROVIDER_SITE_OTHER): Payer: Self-pay

## 2017-05-28 ENCOUNTER — Ambulatory Visit (INDEPENDENT_AMBULATORY_CARE_PROVIDER_SITE_OTHER): Payer: Self-pay

## 2018-12-19 MED ADMIN — nicotine 21 mg/24 hr daily transdermal patch: TRANSDERMAL | @ 07:00:00

## 2019-03-07 ENCOUNTER — Other Ambulatory Visit: Payer: Self-pay

## 2022-05-05 IMAGING — MG MAMMOGRAPHY DIAGNOSTIC RIGHT 3D TOMOSYNT
4 series · 4 of 12 positions shown · non-contrast
Comparison: Comparison was made to prior examinations.

________________________________________________________________________________________________ 
MAMMOGRAPHY DIAGNOSTIC RIGHT 3D TOMOSYNT, 05/05/2022 [DATE]: 
CLINICAL INDICATION: Enlarging right breast.
TECHNIQUE: Unilateral right breast digital mammography and 3-D Tomosynthesis 
were obtained. In addition, computer-aided detection was utilized. 
BREAST DENSITY: (Level B) There are scattered areas of fibroglandular density.

[R MLO]
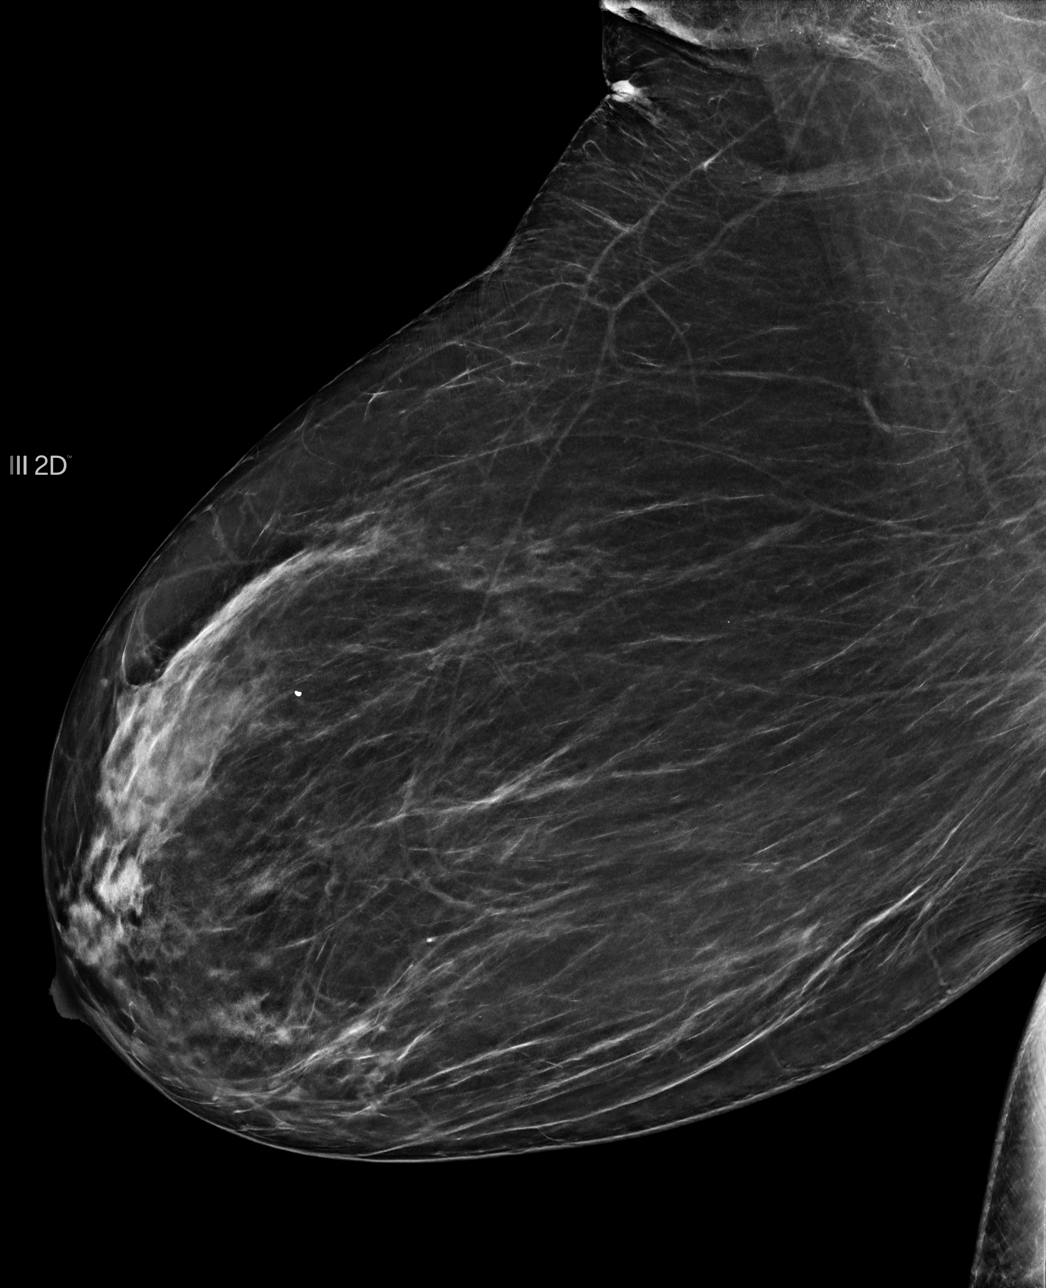

[R CC]
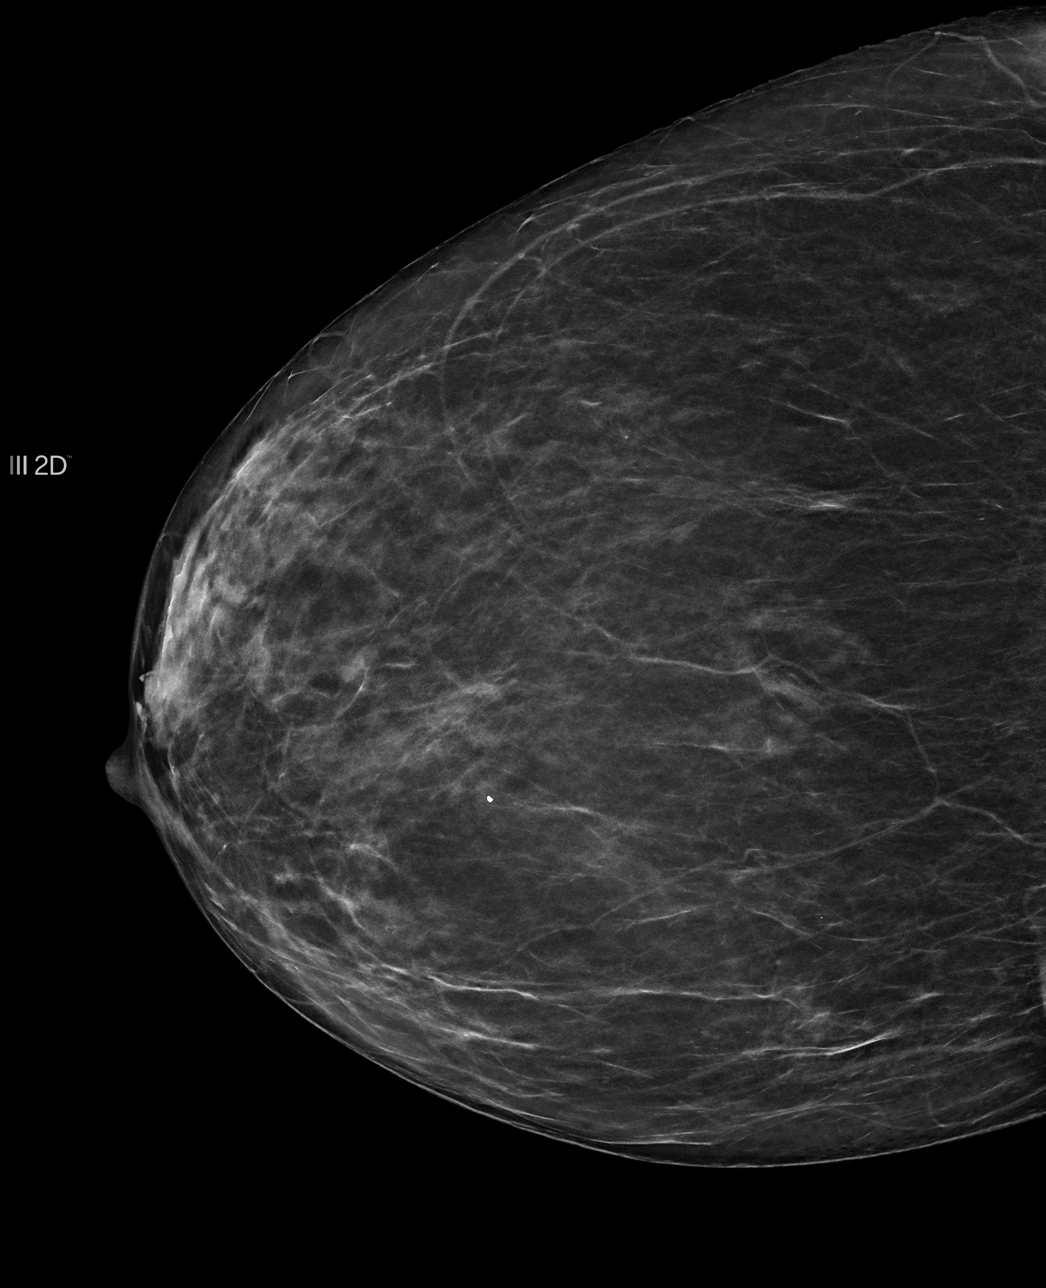

[R MLO tomo · tomo slice 35/69.0]
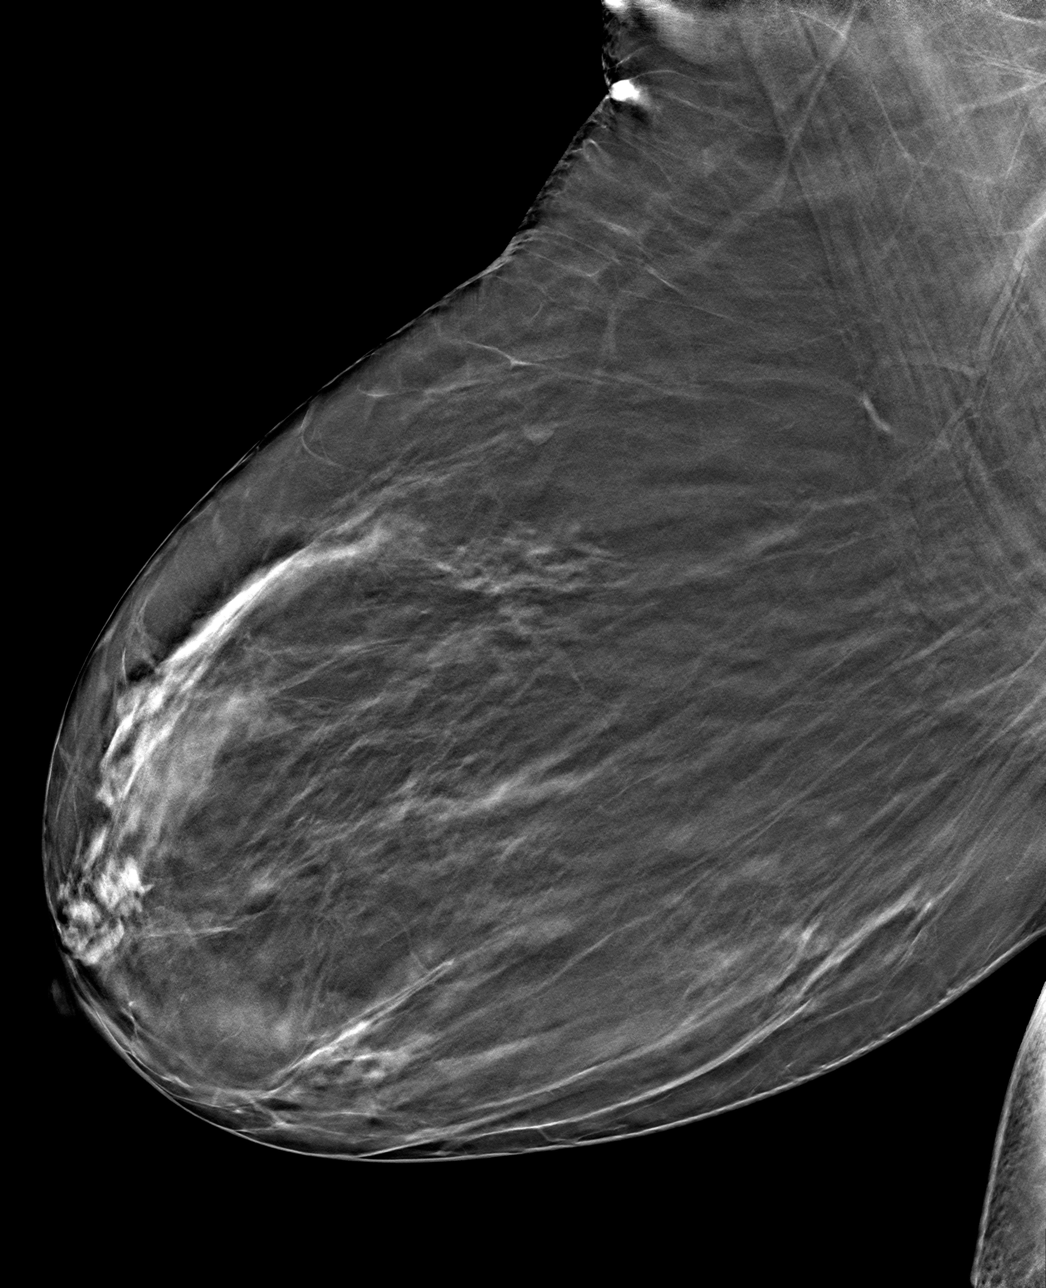

[R CC tomo · tomo slice 28/55.0]
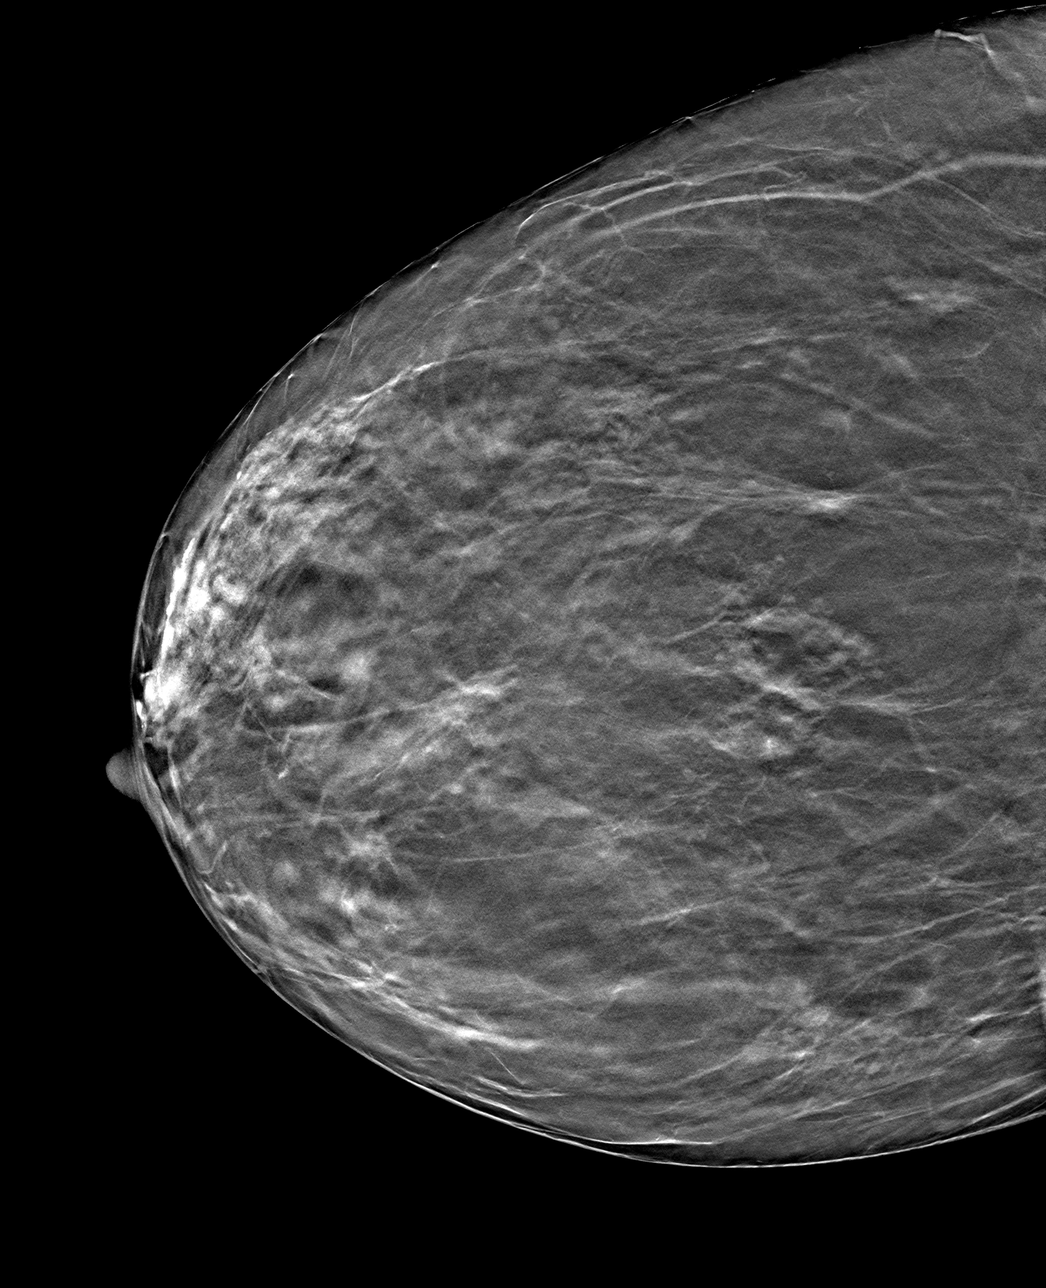

[4 of 12 positions shown; findings below may reference images not displayed]

FINDINGS: Benign calcification No suspicious mass, calcifications, or area of 
architectural distortion in the right breast.
IMPRESSION: No dominant mass or suspicious calcification.  
(BI-RADS 2) Benign findings. Routine mammographic follow-up is recommended.

## 2022-05-14 IMAGING — MR MRI BRAIN WITHOUT CONTRAST
5 of 9 series · 25 of 48 positions shown · IV contrast (gadolinium)
Comparison: None

________________________________________________________________________________________________ 
MRI BRAIN WITHOUT CONTRAST, 05/14/2022 [DATE]: 
CLINICAL INDICATION: Dizziness for several months. Memory changes. Hearing loss.
TECHNIQUE: Multiplanar, multiecho position MR images of the brain were performed 
without intravenous gadolinium enhancement. Patient was scanned on a 3T magnet.

[Series 401: FLAIR fat-sat · axial · 5.0mm · 0.60mm/px · z∈[-99,+56]mm · 4 of 27 slices shown]
[im 1/27]
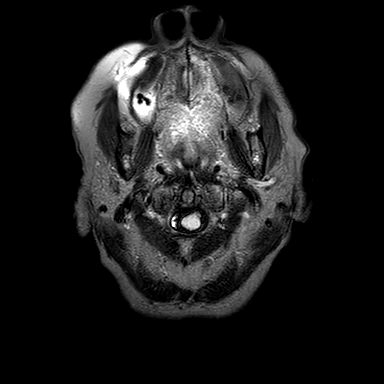
[im 9/27]
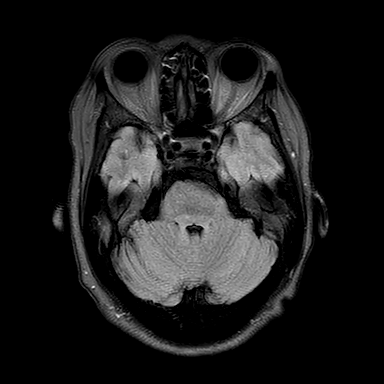
[im 18/27]
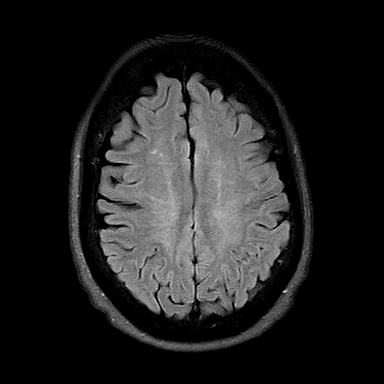
[im 27/27]
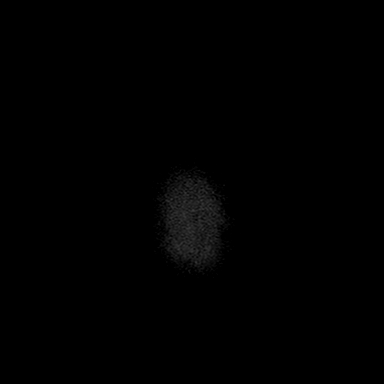

[Series 501: SWI · axial · 3.0mm · 0.53mm/px · z∈[-95,+53]mm · 9 of 100 slices shown (1 of 2)]
[im 1/100]
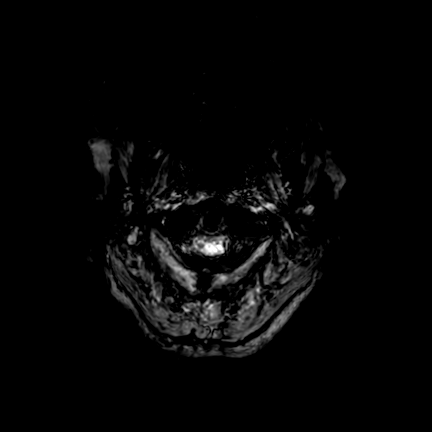
[im 17/100]
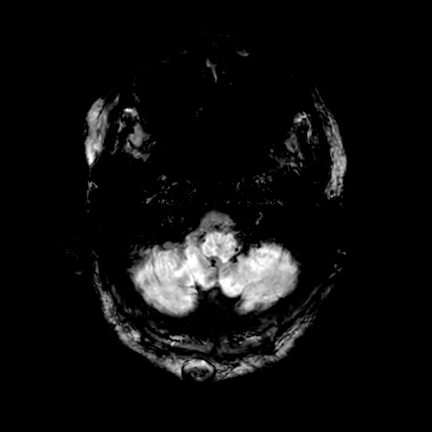
[im 34/100]
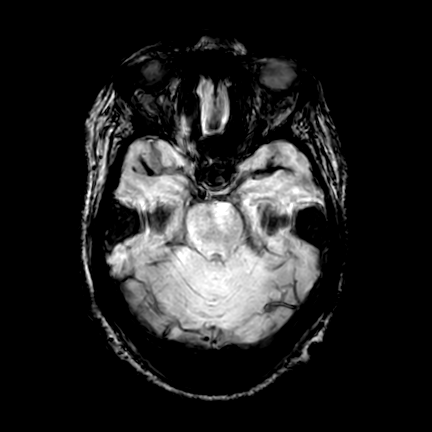
[im 42/100]
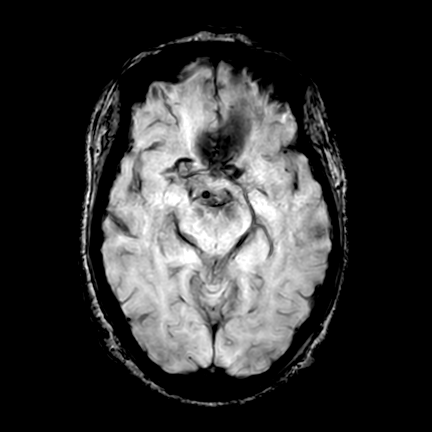
[im 50/100]
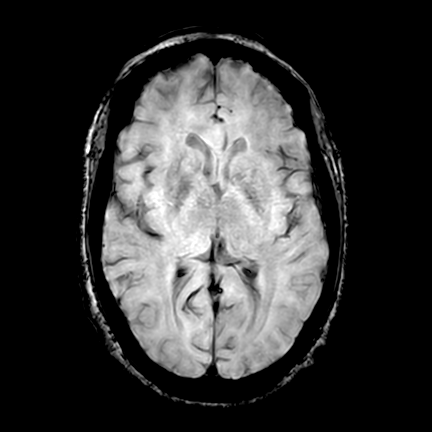
[im 58/100]
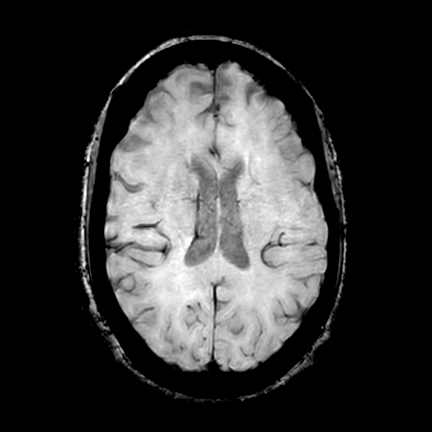
[im 67/100]
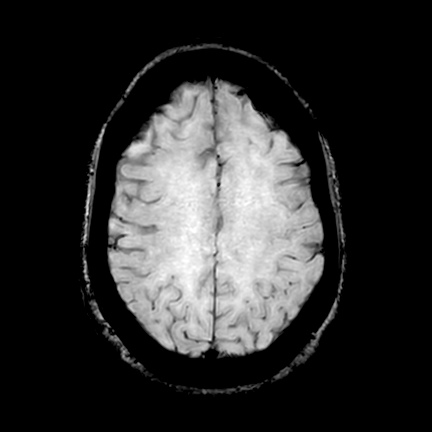
[im 83/100]
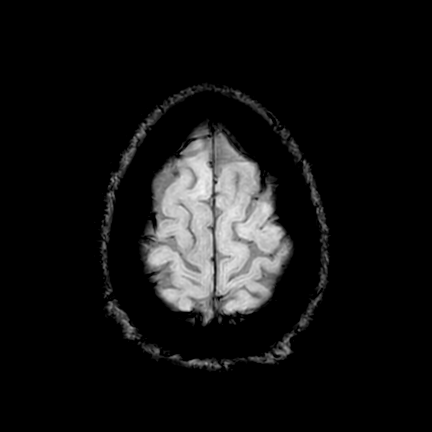
[im 100/100]
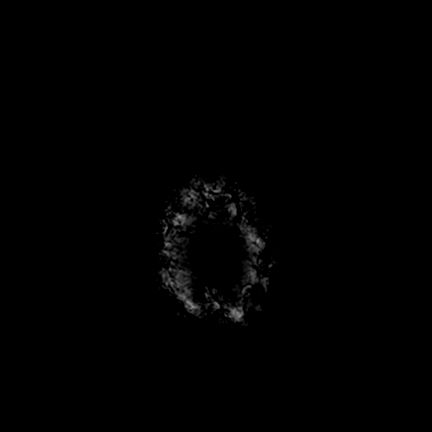

[Series 502: SWI · axial · 10.0mm · 0.53mm/px · z∈[-96,-50]mm · 3 of 78 slices shown (2 of 2)]
[im 1/78]
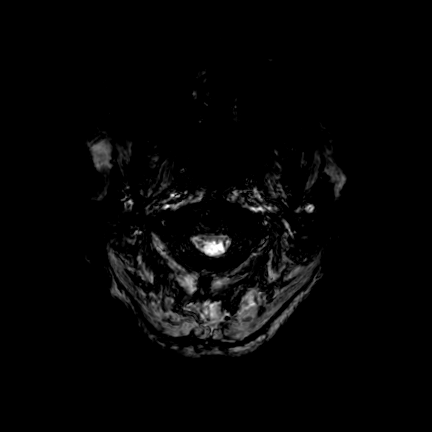
[im 16/78]
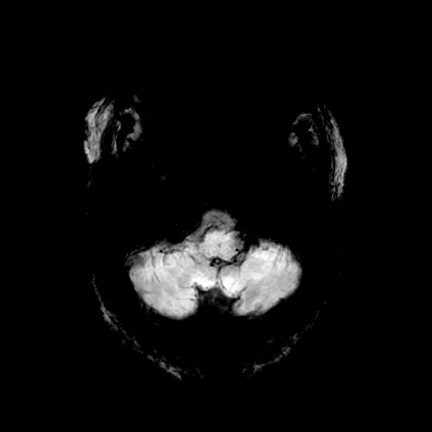
[im 24/78]
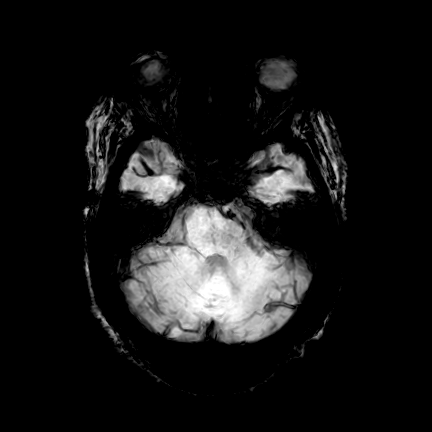

[Series 601: T2 · axial · 5.0mm · 0.41mm/px · z∈[-99,+56]mm · 4 of 27 slices shown (1 of 2)]
[im 1/27]
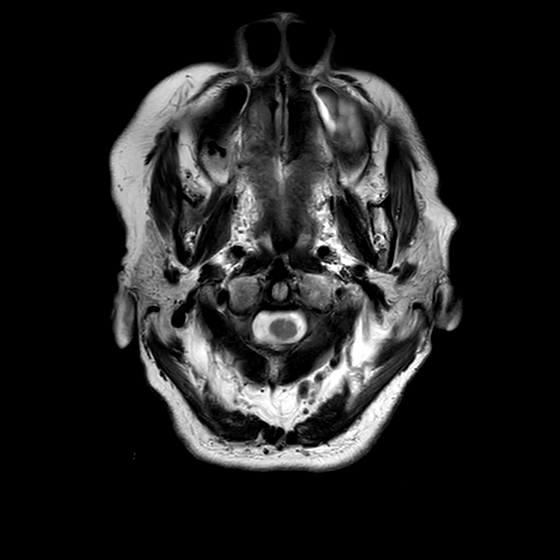
[im 9/27]
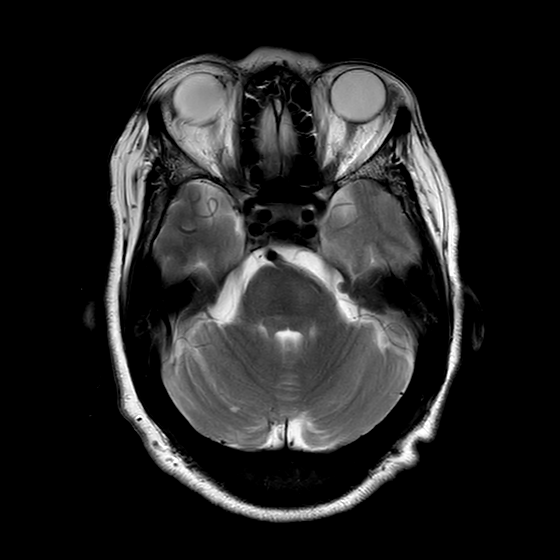
[im 18/27]
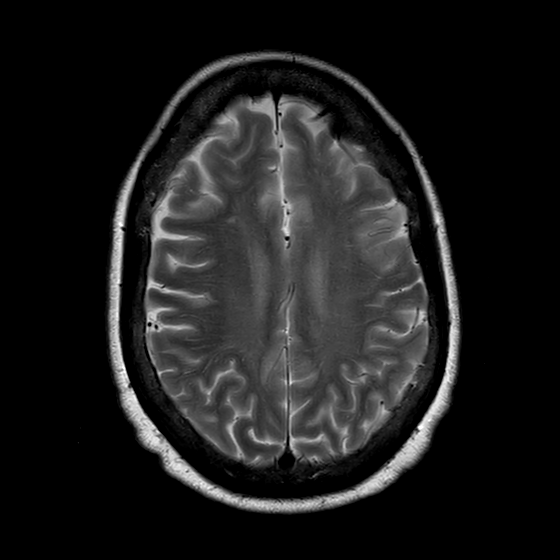
[im 27/27]
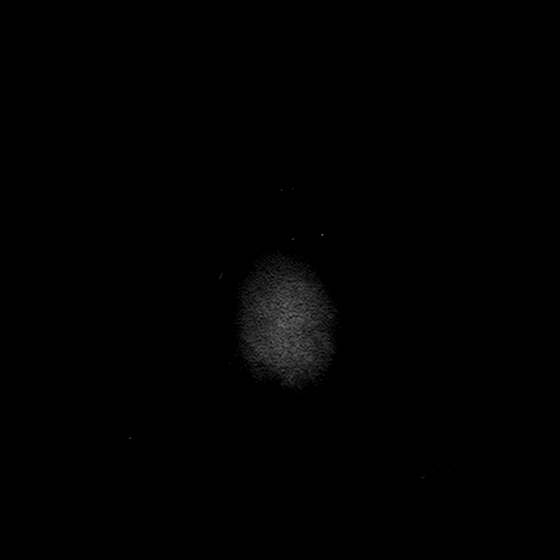

[Series 701: T2 · coronal · 4.0mm · 0.47mm/px · 5 of 38 slices shown (2 of 2)]
[im 1/38]
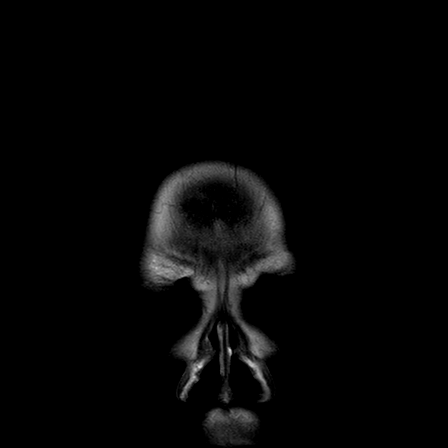
[im 10/38]
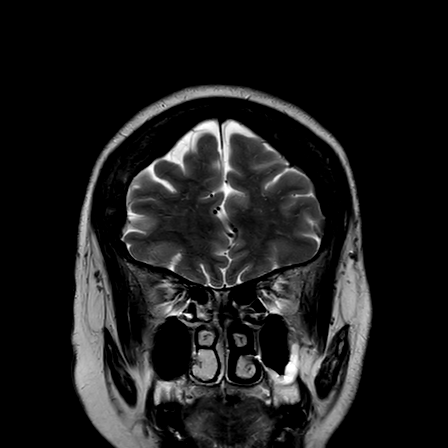
[im 19/38]
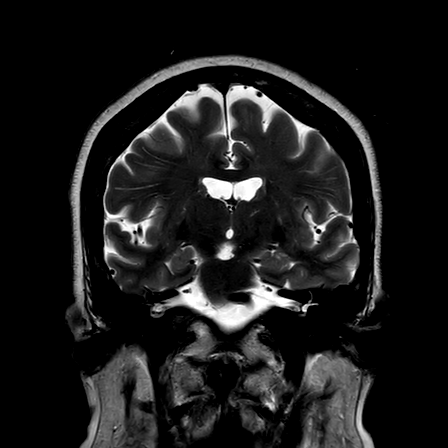
[im 28/38]
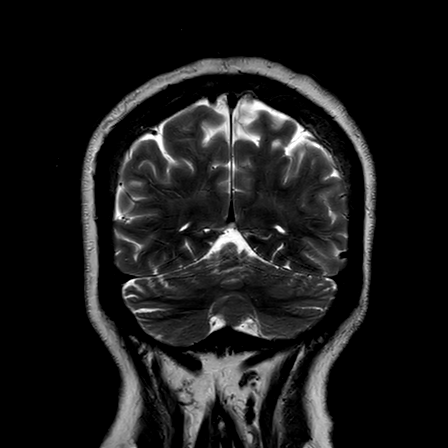
[im 38/38]
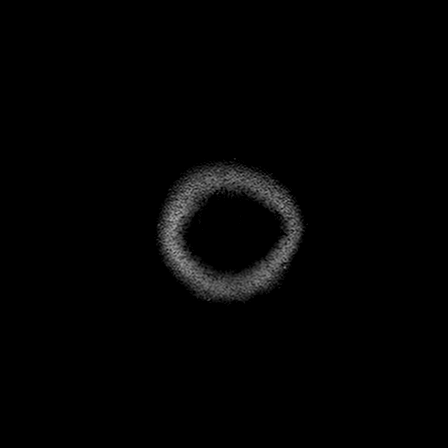

[25 of 48 positions shown; findings below may reference images not displayed]

FINDINGS: -------------------------------------------------------------------------------- 
------------------------- 
INTRACRANIAL: 
Mild degree of nonspecific periventricular and deep white matter T2 FLAIR 
hyperintensity is most likely related to chronic small vessel vascular change. 
No mass or abnormal extra-axial fluid collection. 
No acute ischemia. No abnormal foci of susceptibility artifact in the brain. 
Patency of the intracranial vascular flow voids.  No acute intracranial 
hemorrhage, mass effect, midline shift. No large sellar mass. No hydrocephalus. 
Cerebral volume is age appropriate.  
-------------------------------------------------------------------------------- 
----------------------- 
OTHER: 
ORBITS/SINUSES/T-BONES:  Visualized orbits show no acute abnormality or mass.  
Mastoid air cells and middle ear cavities are grossly clear.  Left maxillary 
membrane thickening. 
MARROW SIGNAL/SOFT TISSUES: No focal suspect signal abnormality.  16 mm 
subcutaneous lesion in the right para midline posterior occipital region image 
15 series 301 likely reflects a sebaceous cyst. 7 mm T1 hyperintense focus 
within the right parietal bone is consistent with nonaggressive etiology such as 
interosseous hemangioma. 
-------------------------------------------------------------------------------- 
-------------------
IMPRESSION: No acute intracranial process. Chronic appearing brain parenchymal changes as 
detailed above. Other incidental findings. 
Left maxillary sinus membrane thickening.

## 2022-07-25 IMAGING — DX LUMBAR SPINE AP, LAT WITH FLEXION AND EXTEN
1 series · 6 of 6 positions shown · non-contrast
Comparison: Whole-body bone scan of 07/23/2007.

________________________________________________________________________________________________ 
LUMBAR SPINE AP, LAT WITH FLEXION AND EXTEN, 07/25/2022 [DATE]: 
CLINICAL INDICATION: Spondylosis w/o myelopathy or radiculopathy, lumbar region.

[Series 1: AP · U · 0.14mm/px · 6 of 6 slices shown]
[im 1/6]
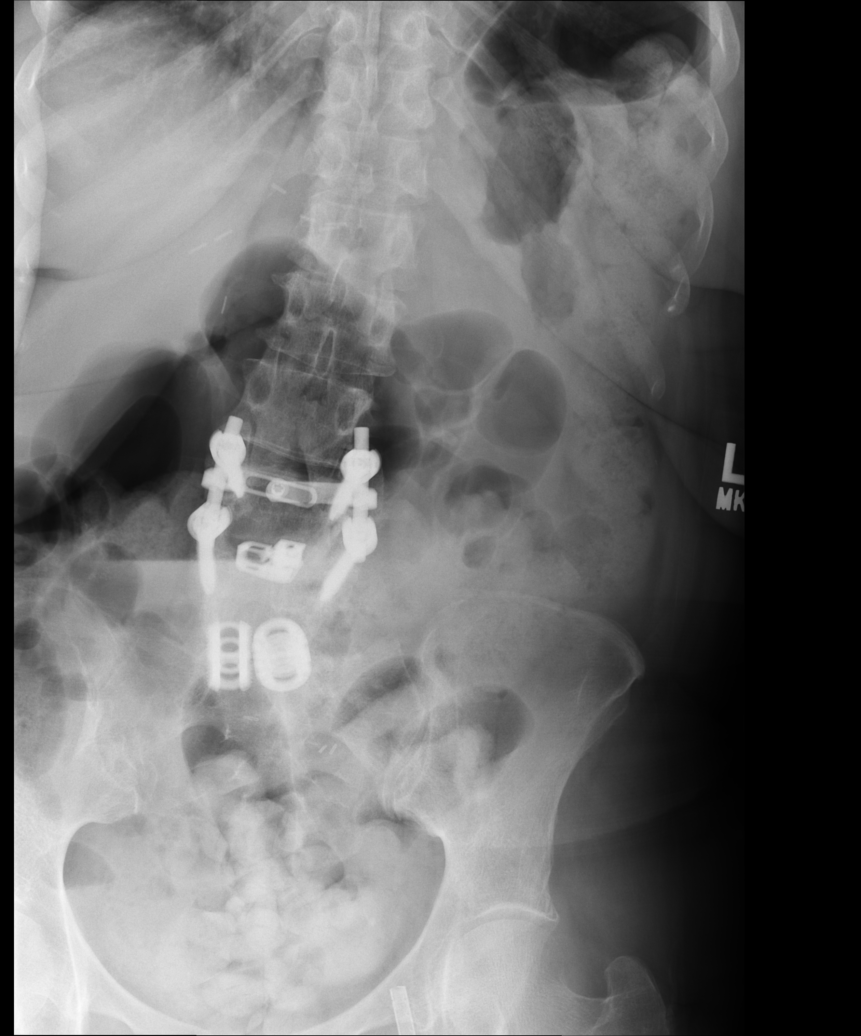
[im 2/6]
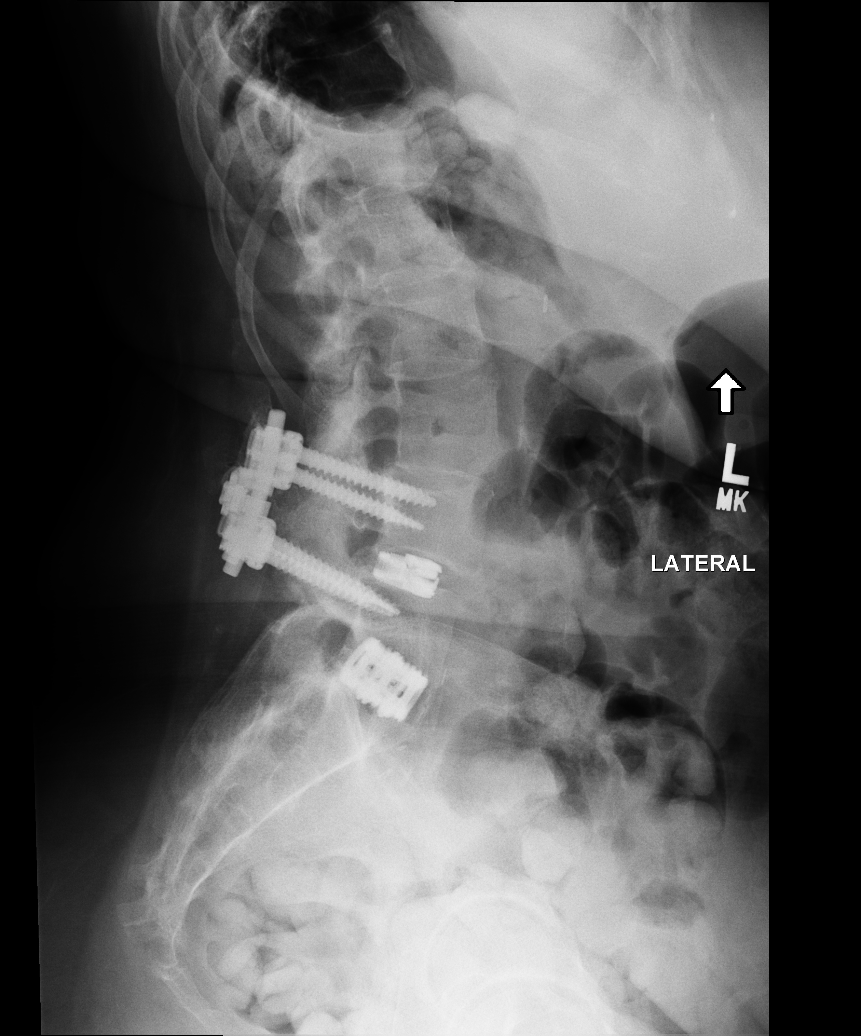
[im 3/6]
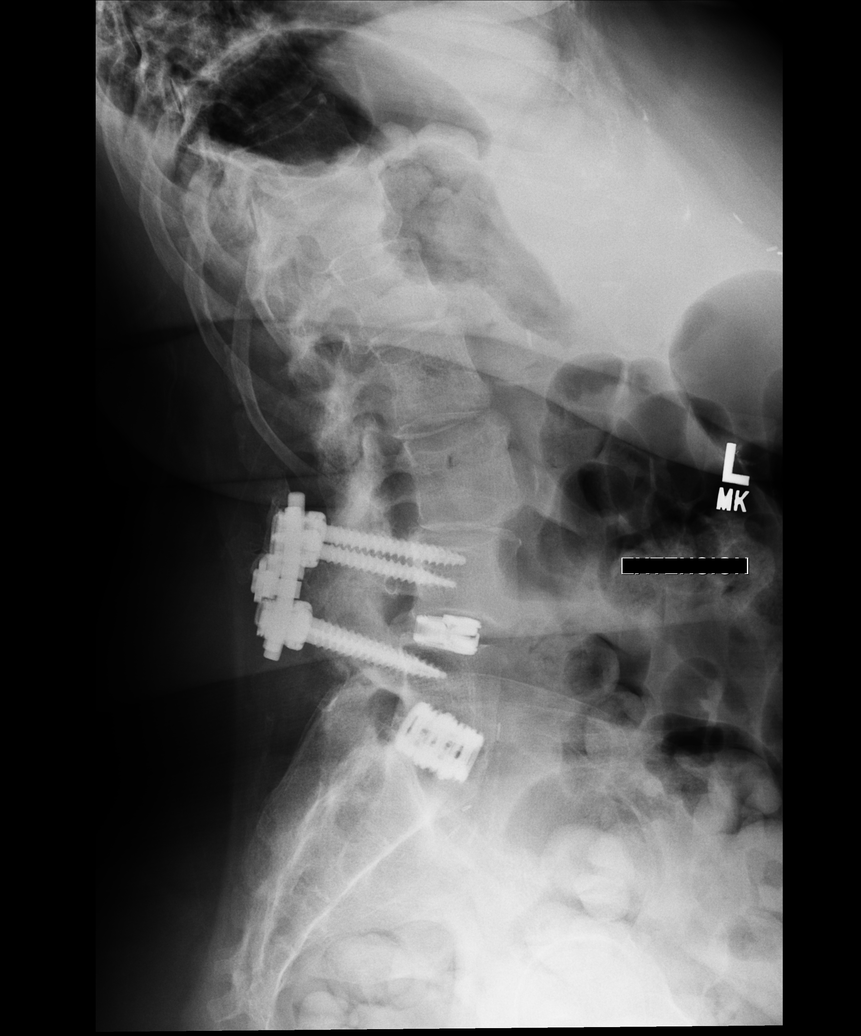
[im 4/6]
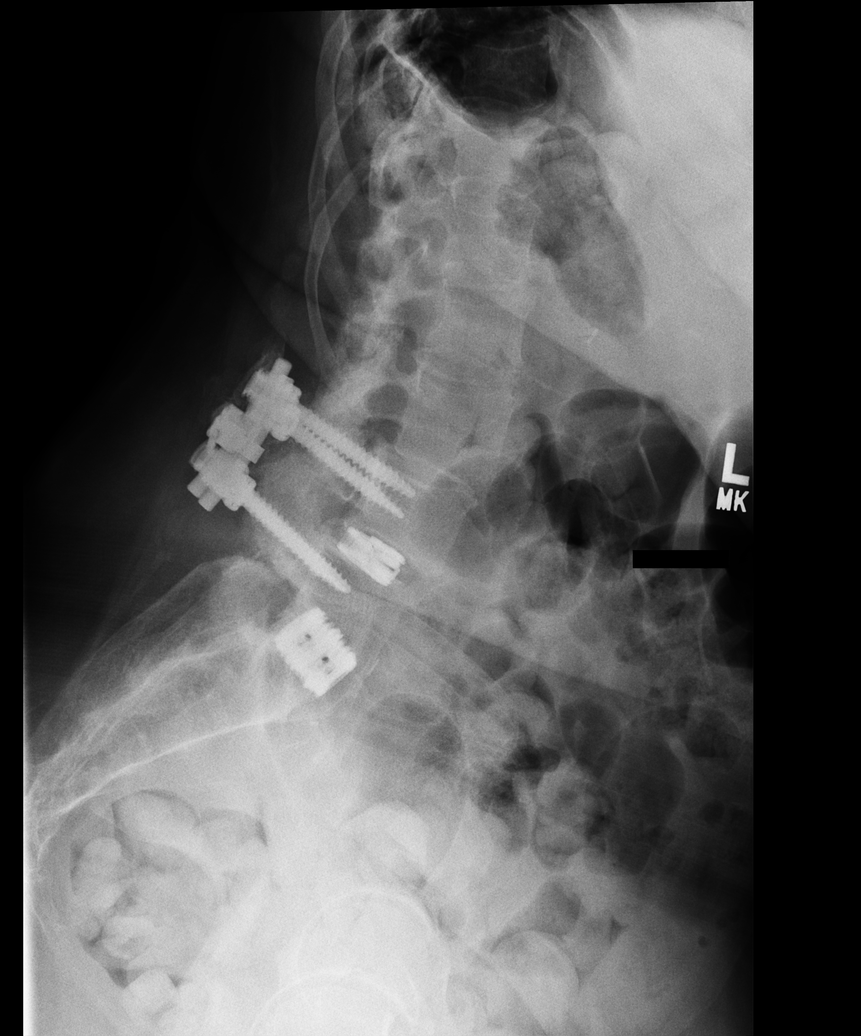
[im 5/6]
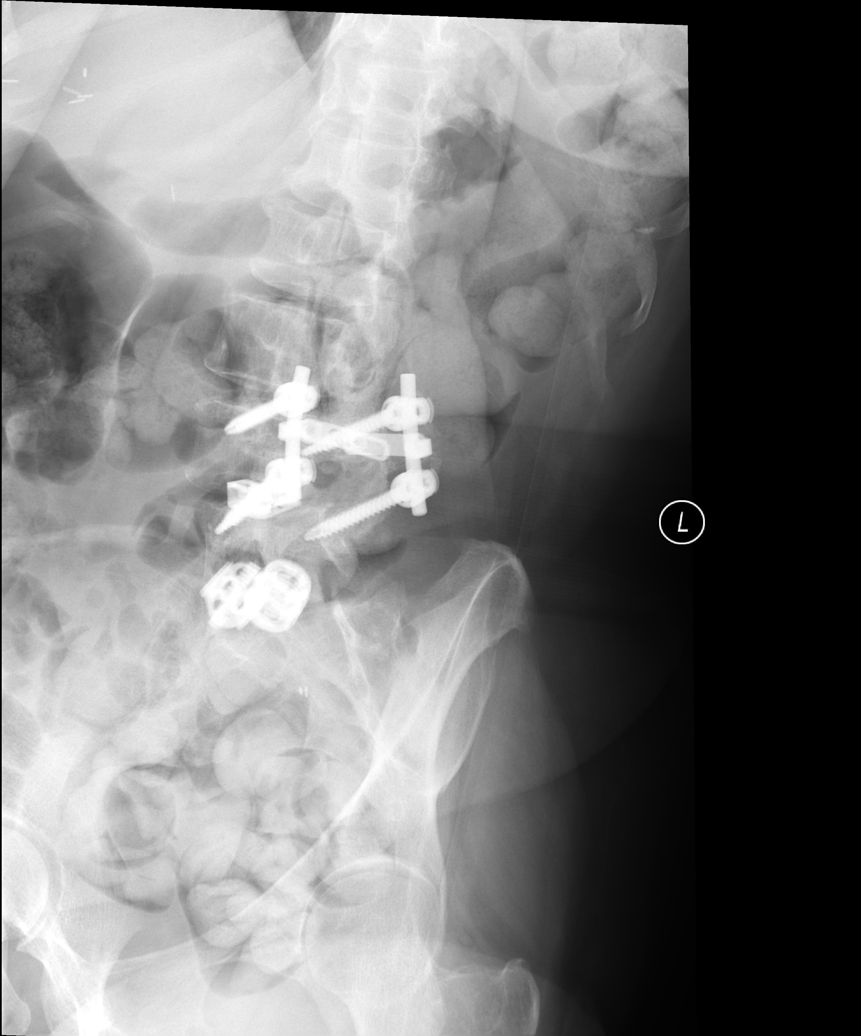
[im 6/6]
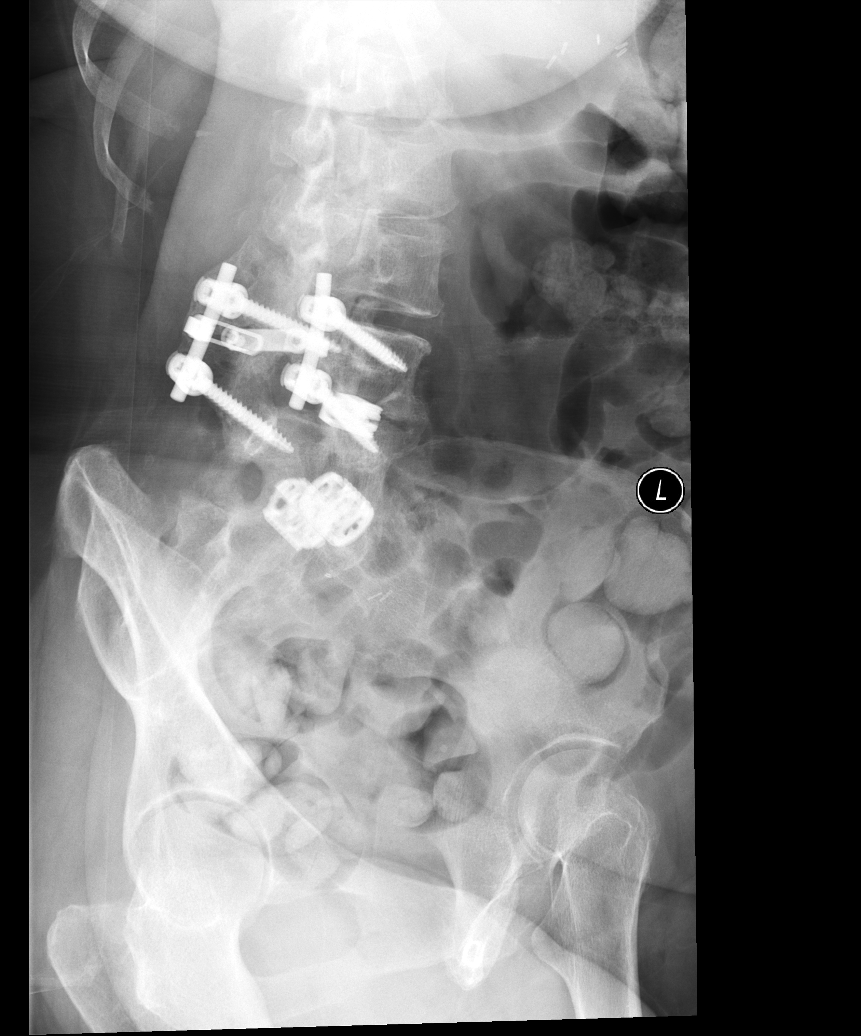

[6 of 6 positions shown; findings below may reference images not displayed]

FINDINGS: Surgical clips upper abdomen. Transpedicular screw rod fixation L4-L5. 
Interbody spacers L4-L5 and L5-S1. Trace anterolisthesis L3 on L4. No vertebral 
body fracture. There is lumbar facet hypertrophy. Hardware is intact. SI joints 
and included hip joint spaces appear preserved.
IMPRESSION: Postsurgical and spondylotic changes lumbar spine. If symptoms persist, 
consideration could be made for MR exam with metal suppression.

## 2022-12-12 IMAGING — MG MAMMOGRAPHY SCREENING BILATERAL 3[PERSON_NAME]
8 series · 8 of 24 positions shown · non-contrast
Comparison: Comparison was made to prior examinations.

________________________________________________________________________________________________ 
MAMMOGRAPHY SCREENING BILATERAL 3STANFORD JUMPER, 12/12/2022 [DATE]: 
CLINICAL INDICATION: Encounter for screening mammogram.
TECHNIQUE: Digital bilateral mammograms and 3-D Tomosynthesis were obtained. 
These were interpreted both primarily and with the aid of computer-aided 
detection system.  
BREAST DENSITY: (Level C) The breasts are heterogeneously dense, which may 
obscure small masses.

[L CC]
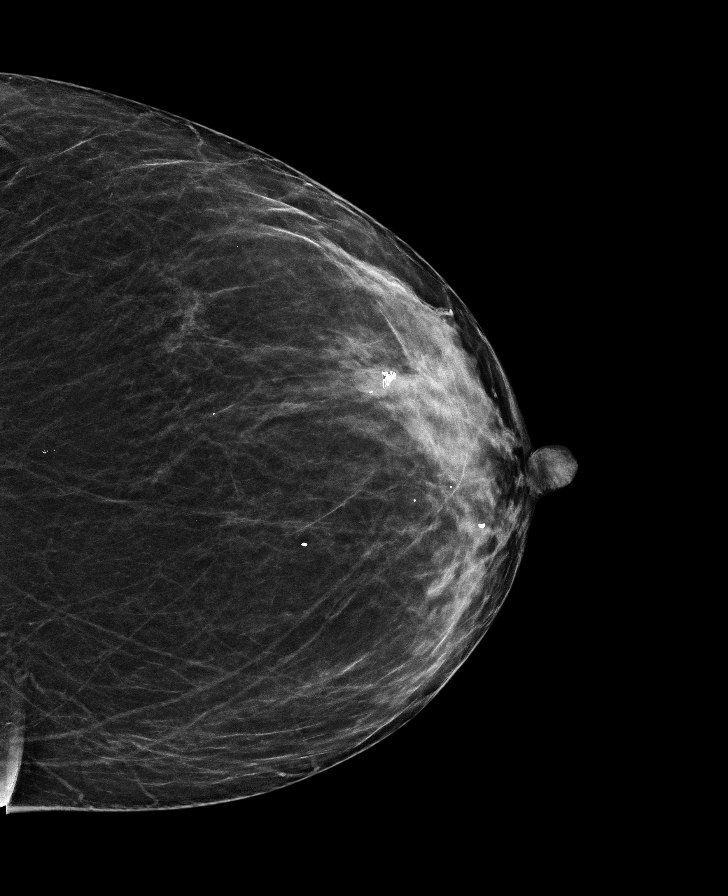

[L MLO]
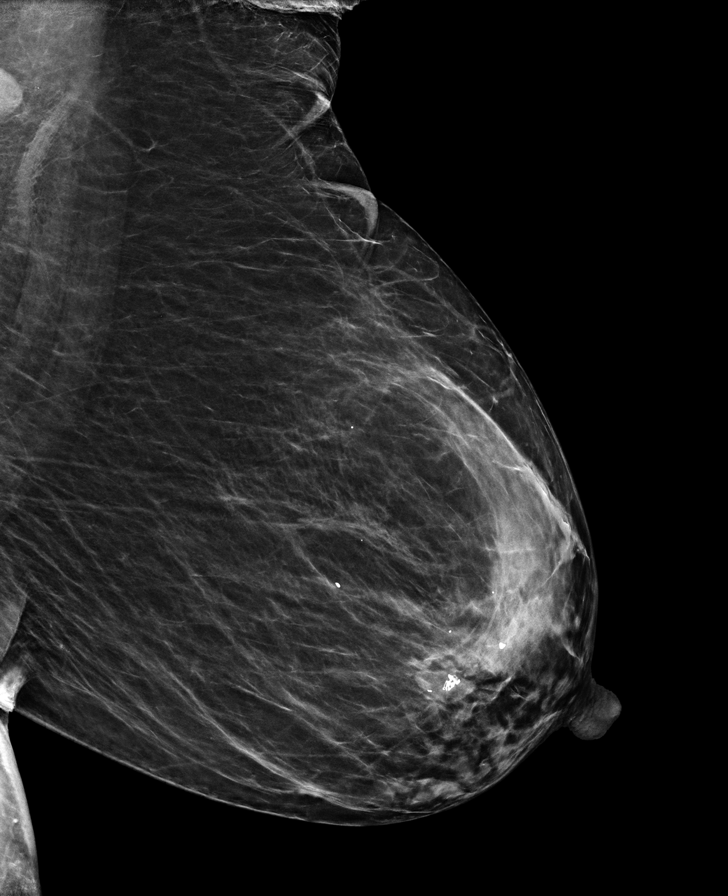

[R CC]
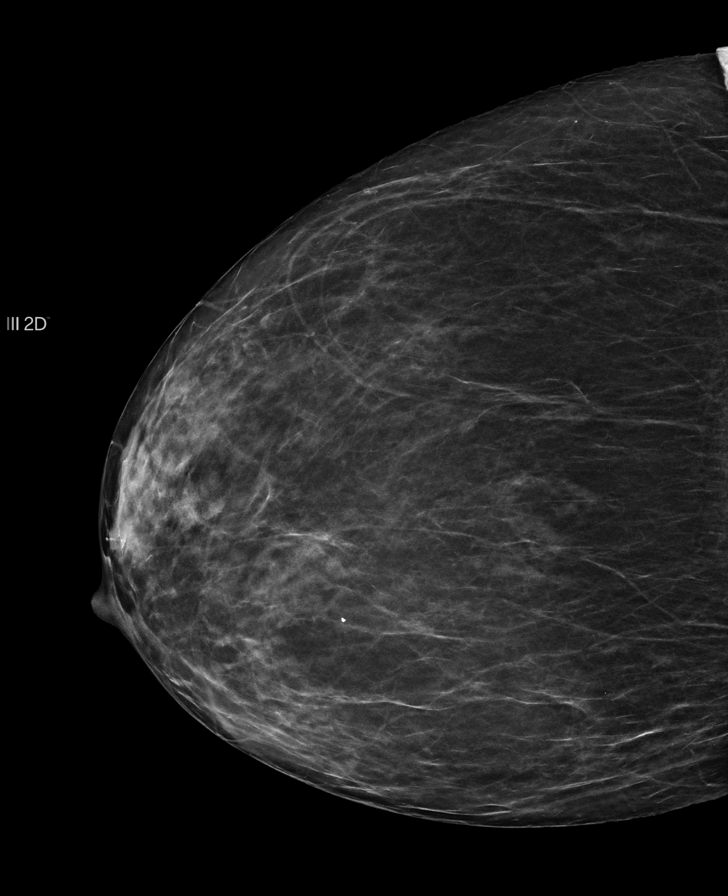

[R MLO]
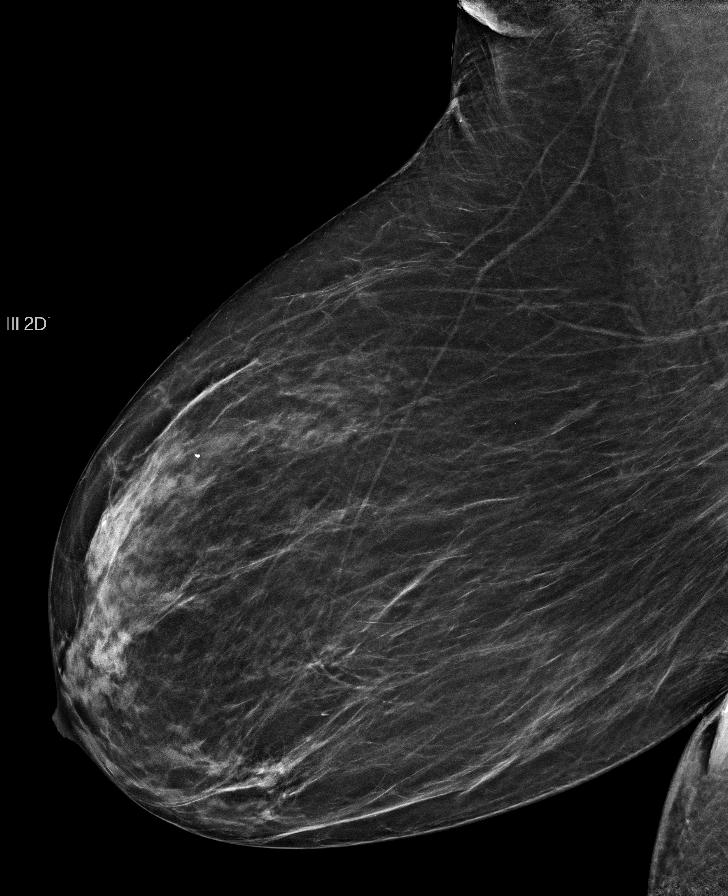

[L CC tomo · tomo slice 29/56.0]
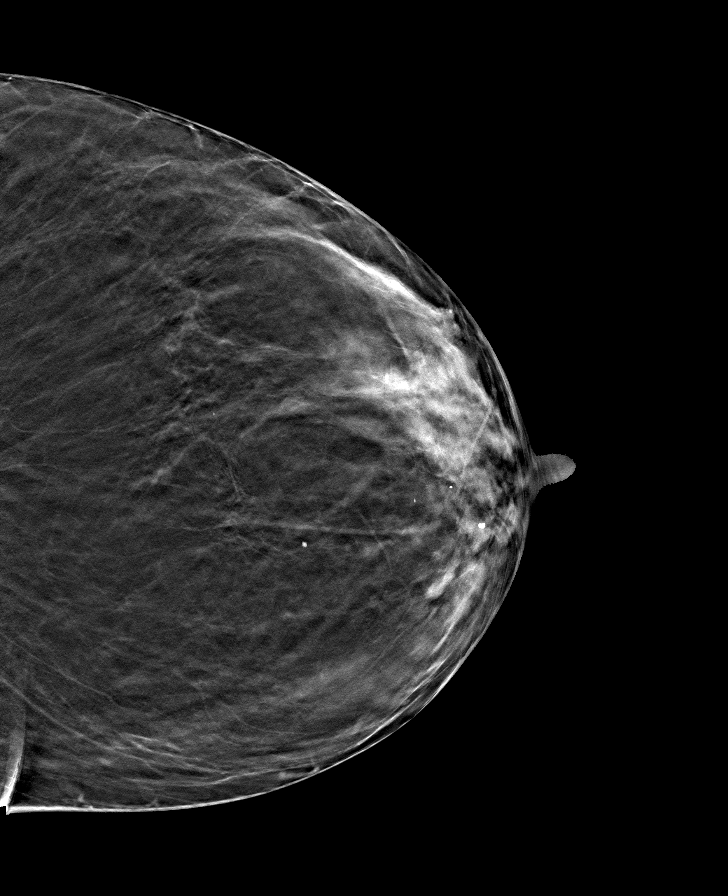

[R CC tomo · tomo slice 26/51.0]
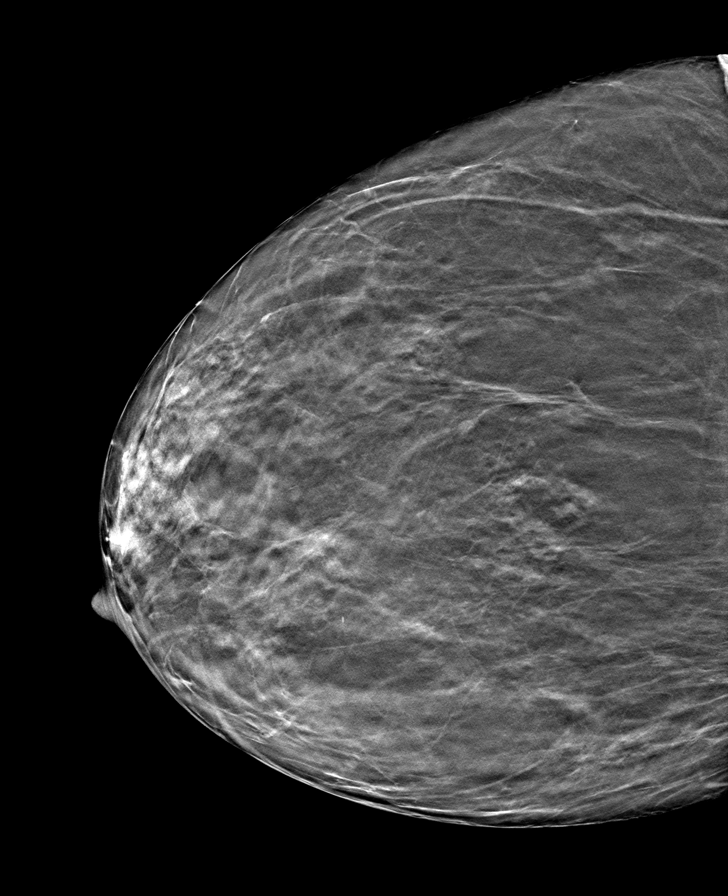

[R MLO tomo · tomo slice 31/60.0]
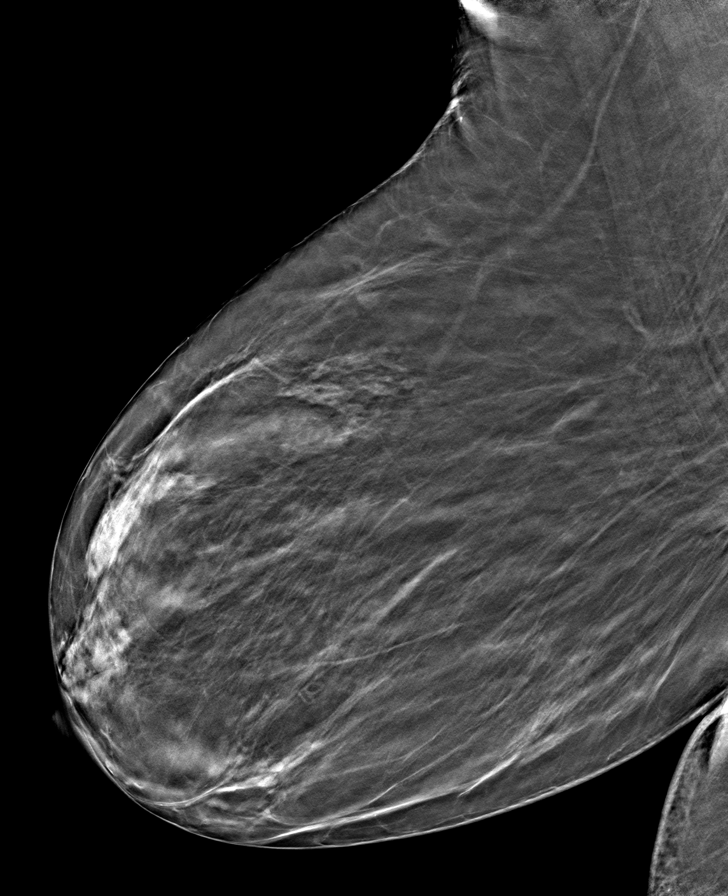

[L MLO tomo · tomo slice 31/60.0]
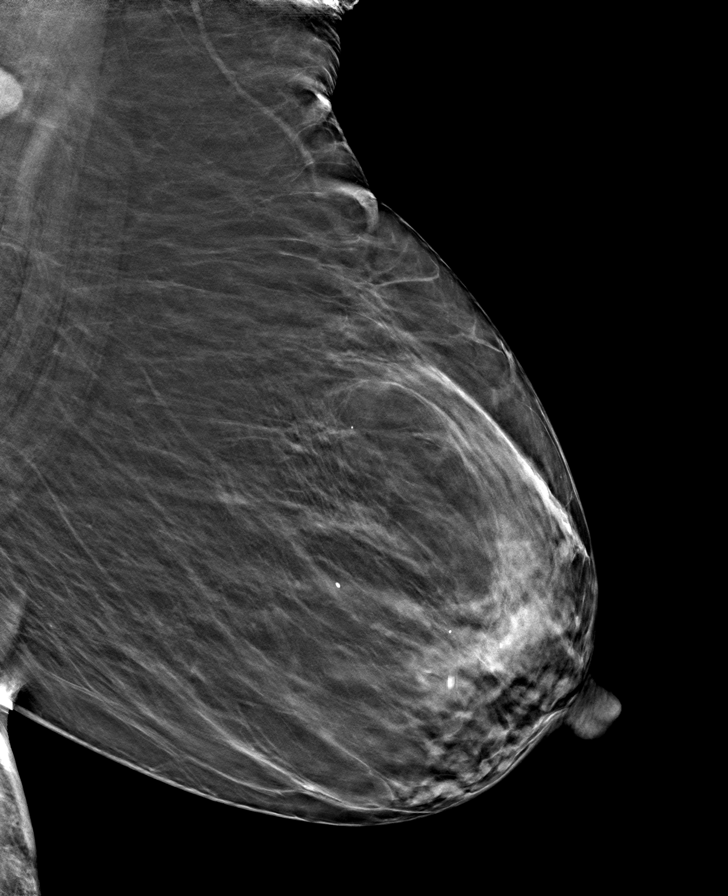

[8 of 24 positions shown; findings below may reference images not displayed]

FINDINGS: Stable calcifications on the left. No new suspicious abnormality. 
Overall stable mammographic appearance.
IMPRESSION: (BI-RADS 2) Benign findings. Routine mammographic follow-up is recommended.

## 2023-06-01 IMAGING — DX KNEE 4 VIEWS RIGHT
4 series · 4 of 4 positions shown · non-contrast
Comparison: Bone scan of 07/23/2007.

________________________________________________________________________________________________ 
KNEE 4 VIEWS RIGHT, KNEE 4 VIEWS LEFT, 06/01/2023 [DATE]: 
CLINICAL INDICATION: Bilateral knee pain. Fall 3 weeks ago.

[AP]
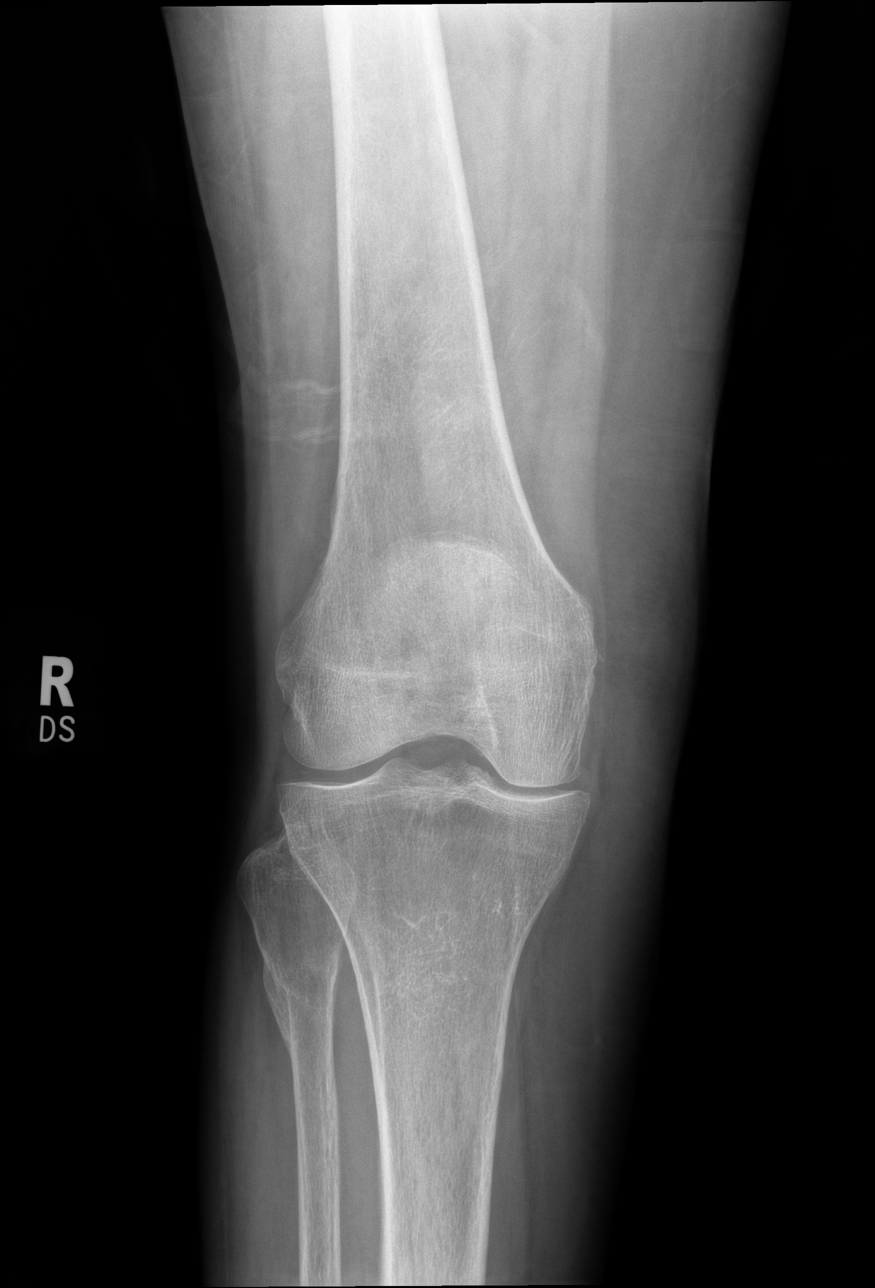

[ap int rot]
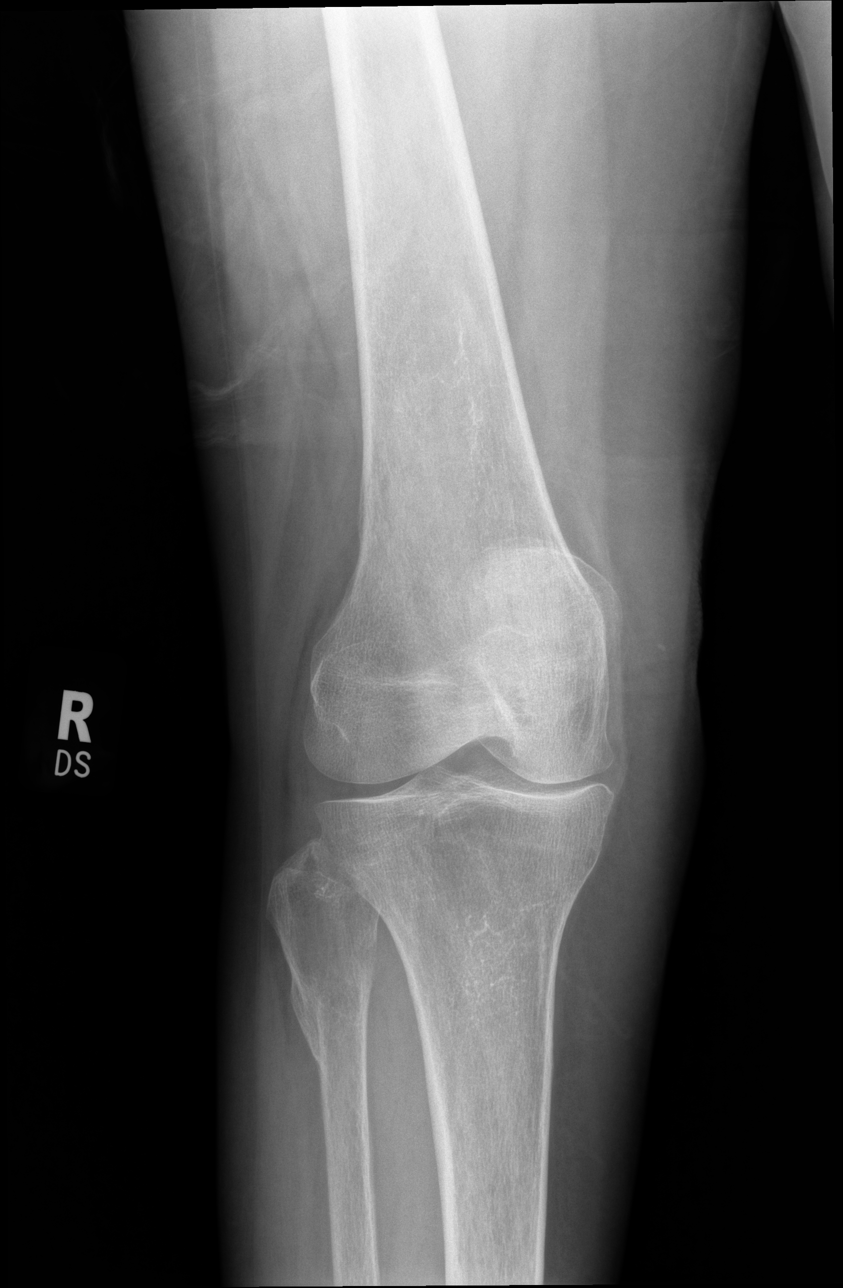

[ap ext rot]
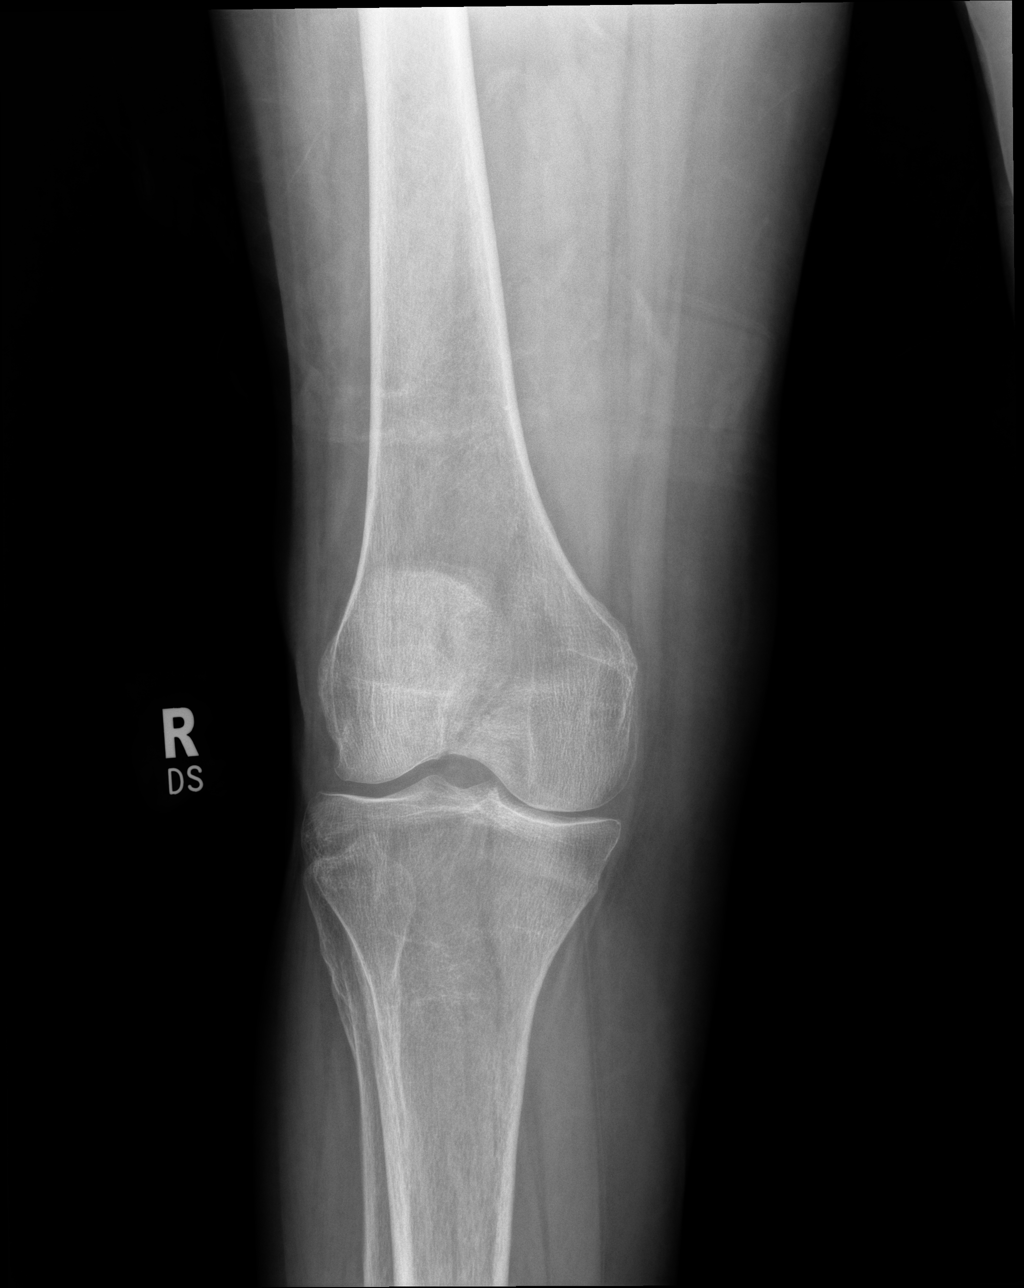

[lateral]
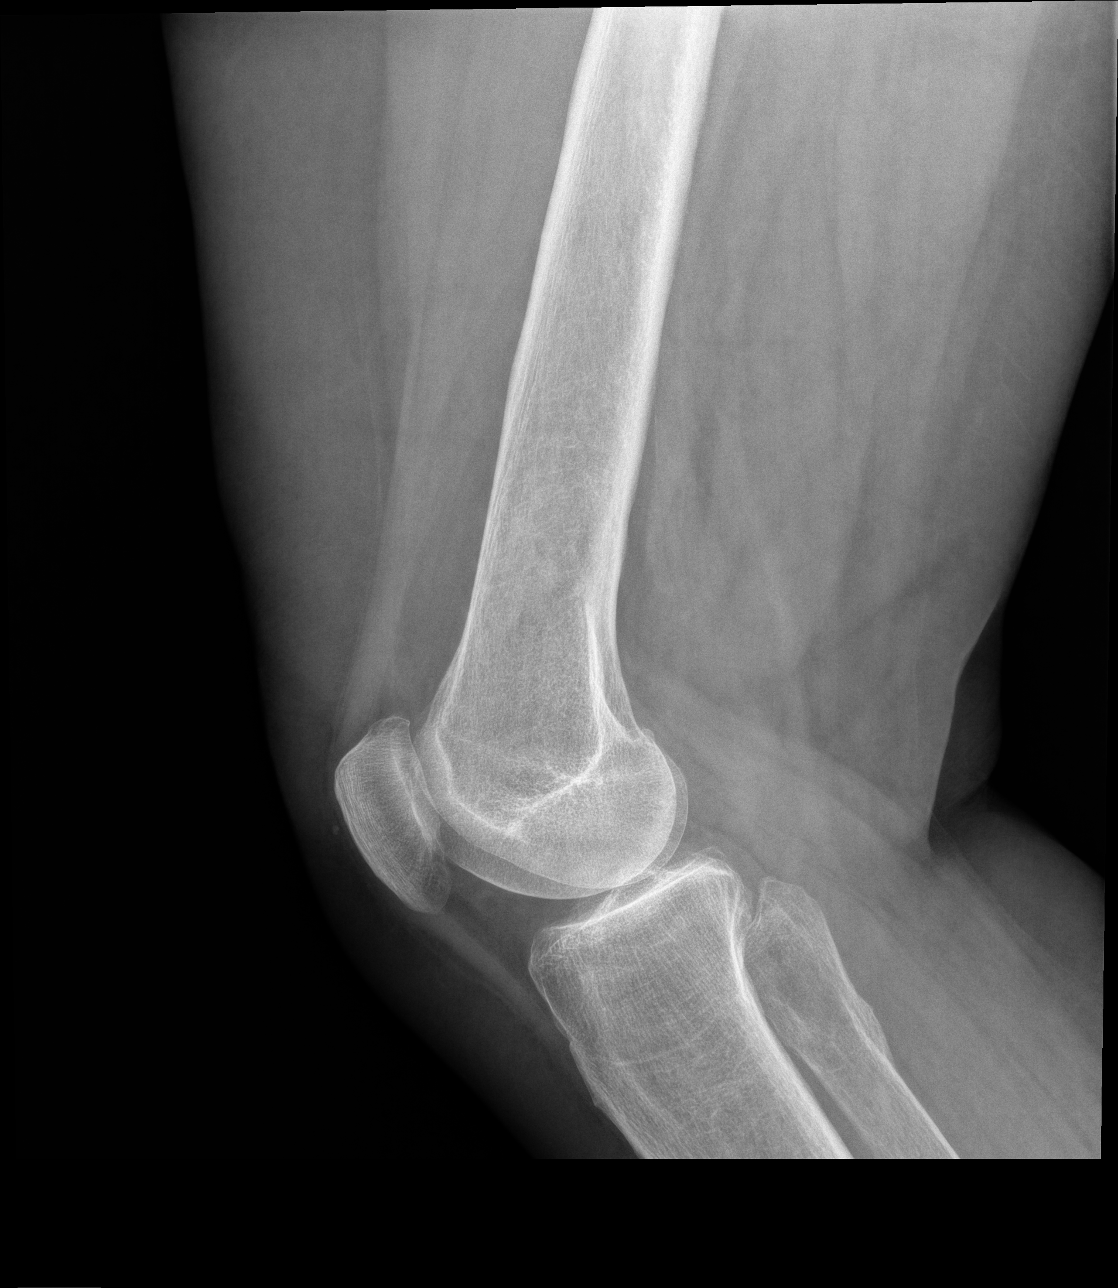

[4 of 4 positions shown; findings below may reference images not displayed]

FINDINGS: Mild to moderate bilateral medial and patellofemoral compartment 
joint space narrowing. There is a 5 mm osteochondral body posterior to the left 
medial compartment. Old healed fracture deformity proximal right fibula. No 
acute fracture. No knee joint effusion. No focal soft tissue swelling. 
Osteopenia.
IMPRESSION: Degenerative changes. Consideration could be made for MR exams.

## 2023-06-01 IMAGING — DX KNEE 4 VIEWS LEFT
4 series · 4 of 4 positions shown · non-contrast
Comparison: Bone scan of 07/23/2007.

________________________________________________________________________________________________ 
KNEE 4 VIEWS RIGHT, KNEE 4 VIEWS LEFT, 06/01/2023 [DATE]: 
CLINICAL INDICATION: Bilateral knee pain. Fall 3 weeks ago.

[AP]
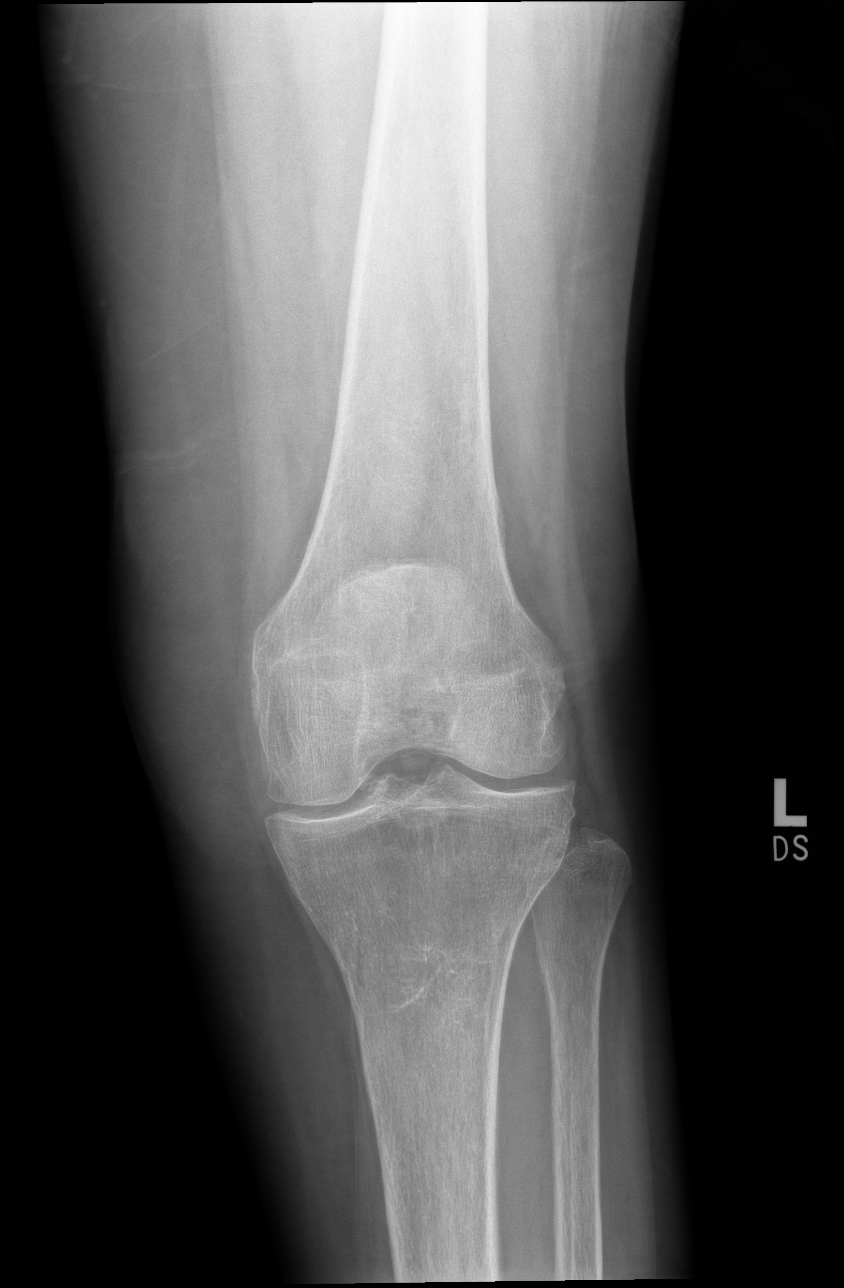

[ap int rot]
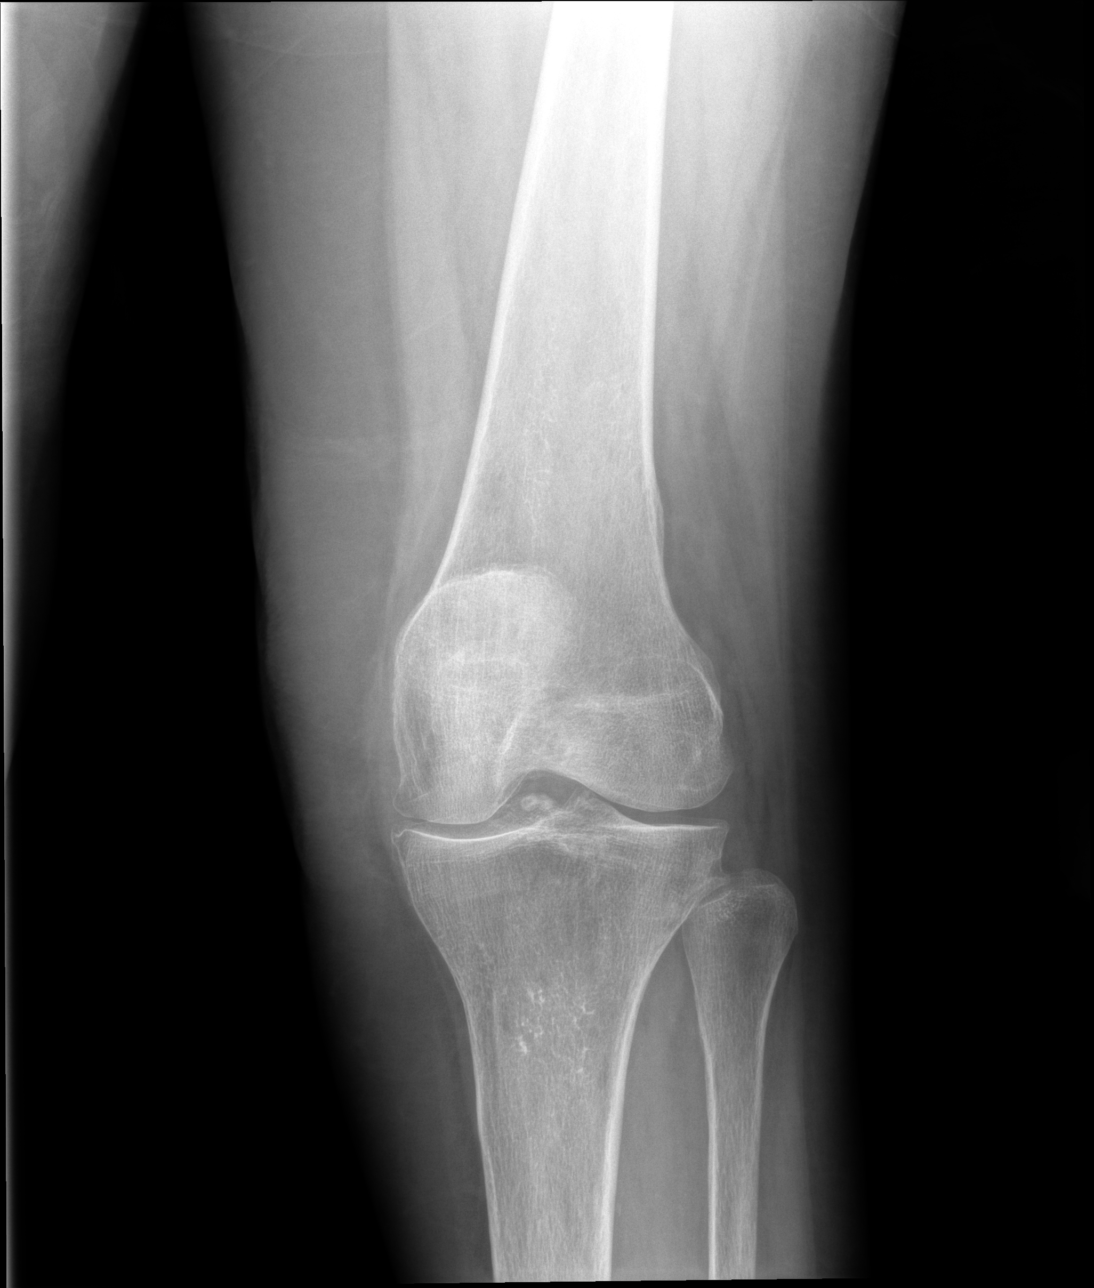

[ap ext rot]
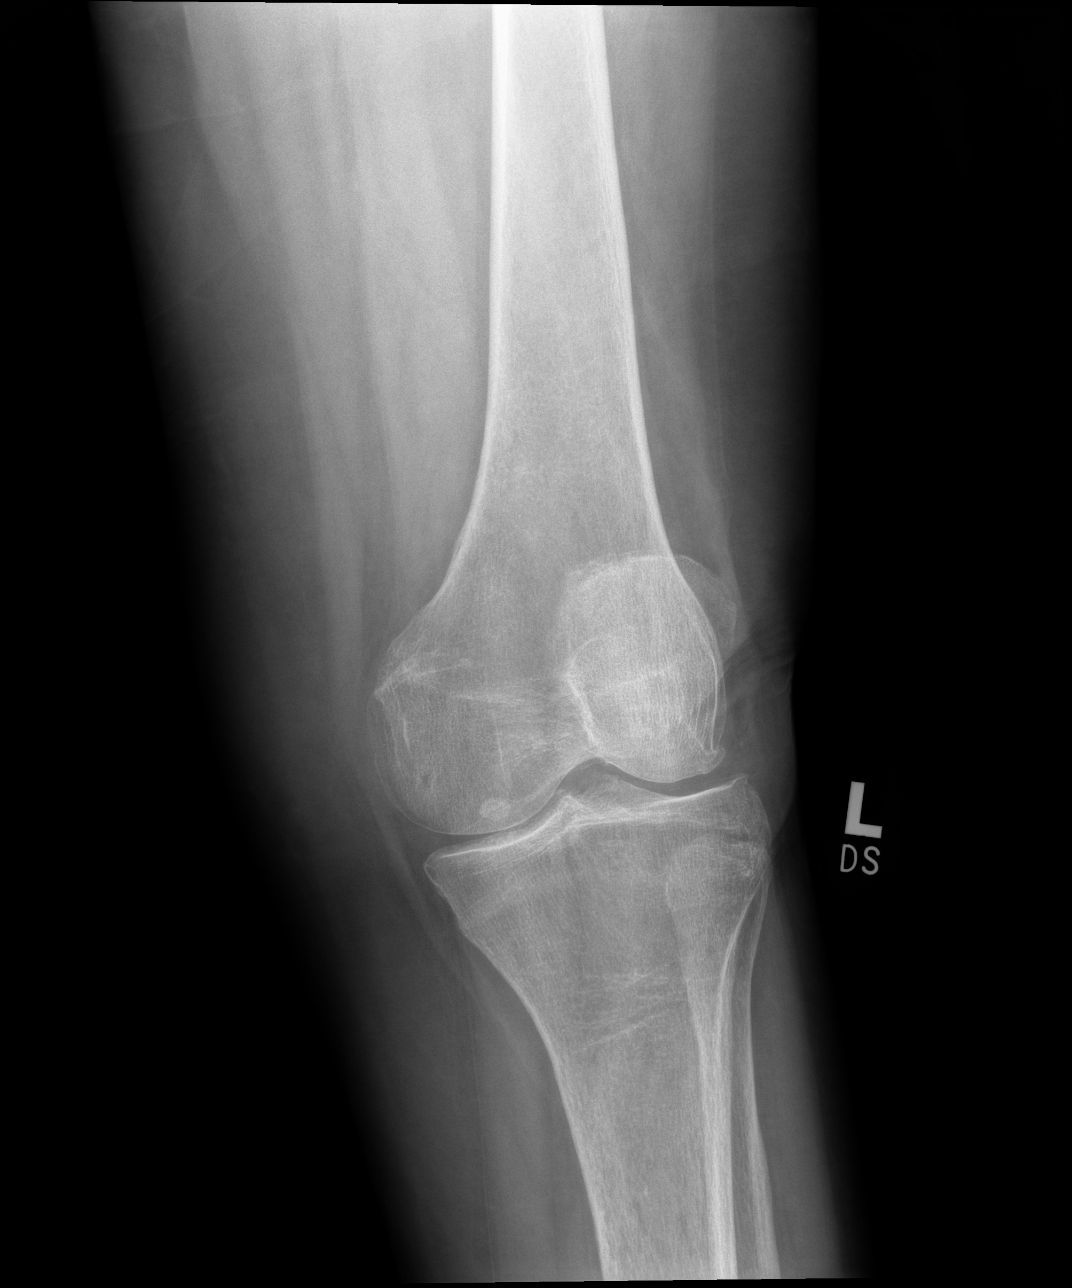

[lateral]
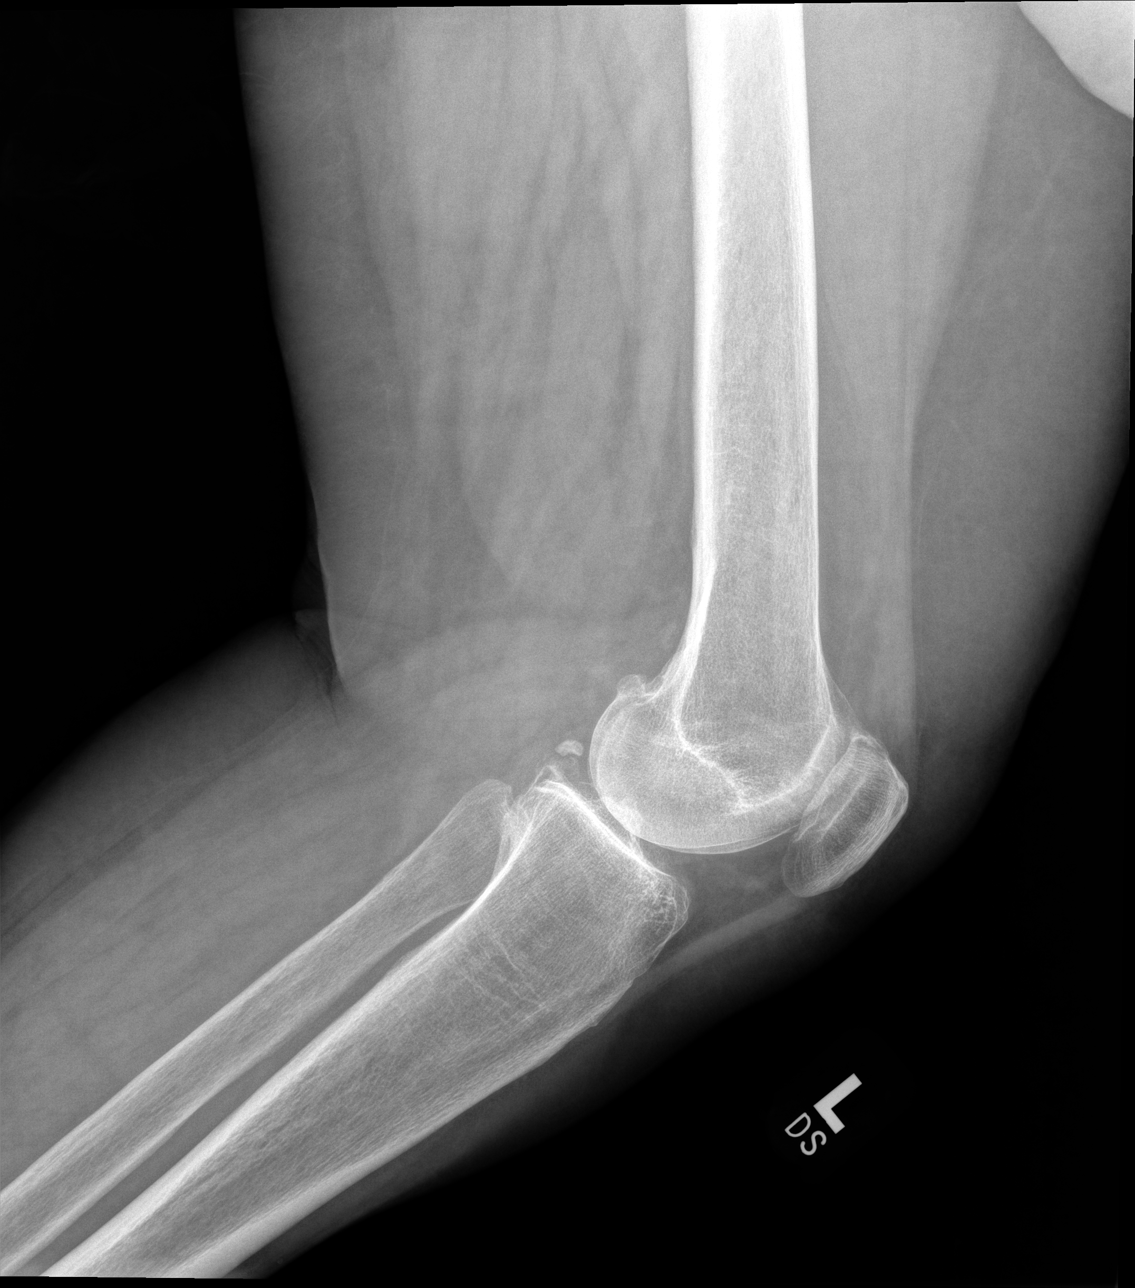

[4 of 4 positions shown; findings below may reference images not displayed]

FINDINGS: Mild to moderate bilateral medial and patellofemoral compartment 
joint space narrowing. There is a 5 mm osteochondral body posterior to the left 
medial compartment. Old healed fracture deformity proximal right fibula. No 
acute fracture. No knee joint effusion. No focal soft tissue swelling. 
Osteopenia.
IMPRESSION: Degenerative changes. Consideration could be made for MR exams.

## 2023-08-31 IMAGING — MR MRI CERVICAL SPINE WITHOUT CONTRAST
7 of 12 series · 11 of 48 positions shown · IV contrast (gadolinium)
Comparison: None

________________________________________________________________________________________________ 
MRI CERVICAL SPINE WITHOUT CONTRAST, 08/31/2023 [DATE]: 
CLINICAL INDICATION: Syncope And Collapse , difficulty with balance.
TECHNIQUE: Multiplanar, multiecho position MR images of the cervical spine were 
performed without intravenous gadolinium enhancement. Patient was scanned on a 
1.5T magnet.

[Series 101: survey · axial · 10.0mm · 1.25mm/px · z∈[-219,+41]mm · 2 of 10 slices shown]
[im 1/10]
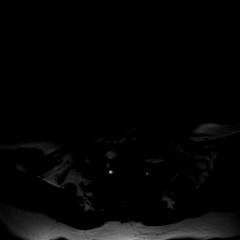
[im 10/10]
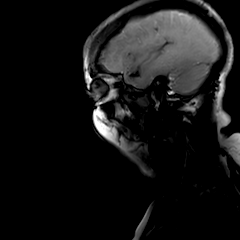

[Series 201: t2w_cor-surv · coronal · 5.0mm · 0.69mm/px · 1 of 9 slices shown]
[im 1/9]
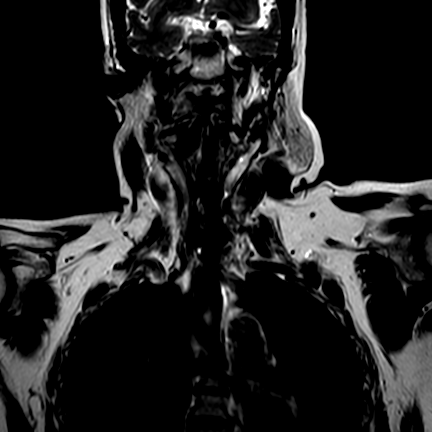

[Series 301: T1 · sagittal · 3.0mm · 0.39mm/px · 1 of 15 slices shown]
[im 1/15]
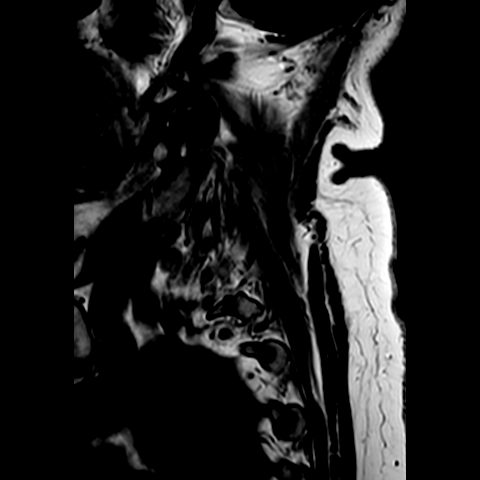

[Series 402: (id)_mdixon_tse · sagittal · 3.0mm · 0.35mm/px · 1 of 15 slices shown]
[im 1/15]
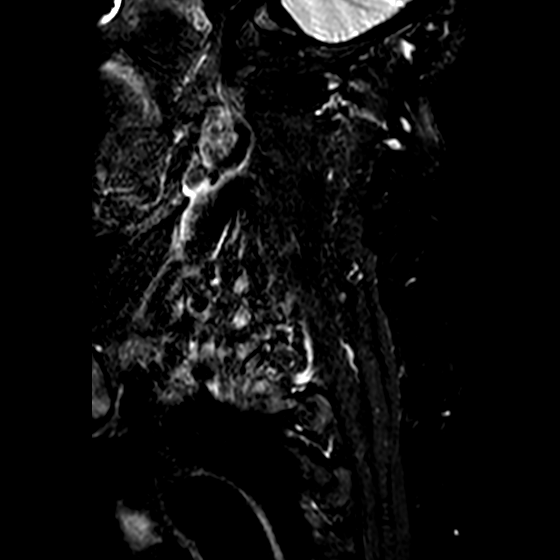

[Series 403: st2w_mdixon_tse · sagittal · 3.0mm · 0.35mm/px · 1 of 15 slices shown]
[im 1/15]
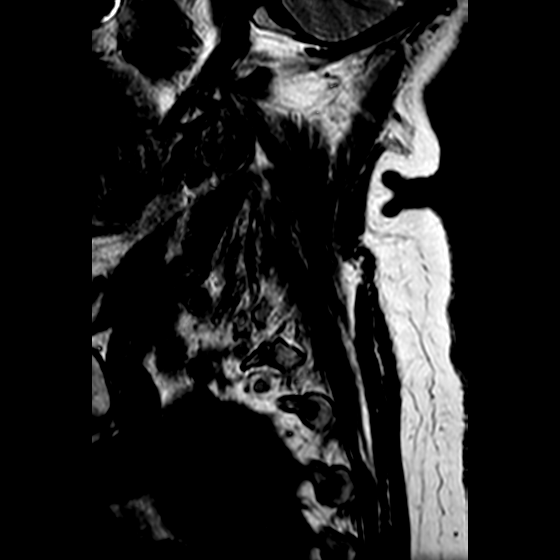

[Series 504: csp oblq left · oblique · 1.0mm · 0.15mm/px · 3 of 36 slices shown]
[im 1/36]
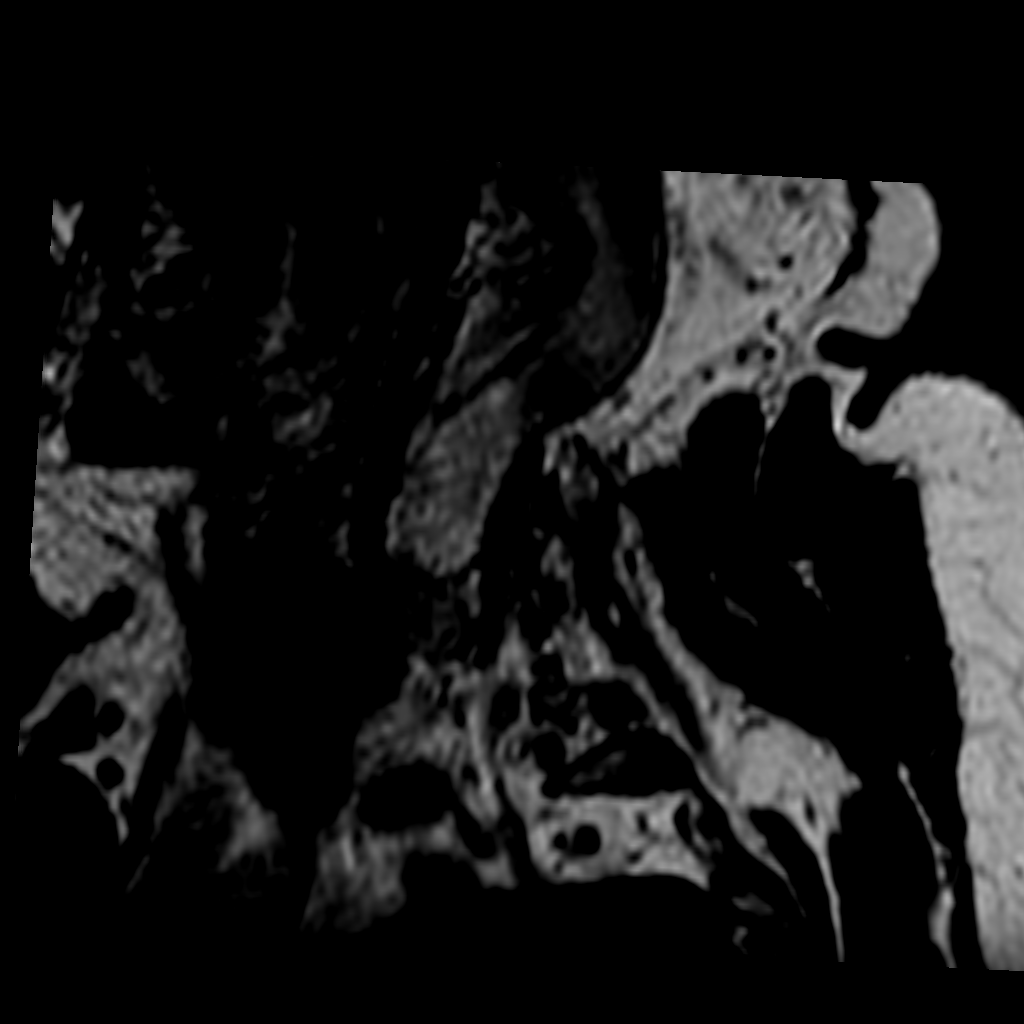
[im 18/36]
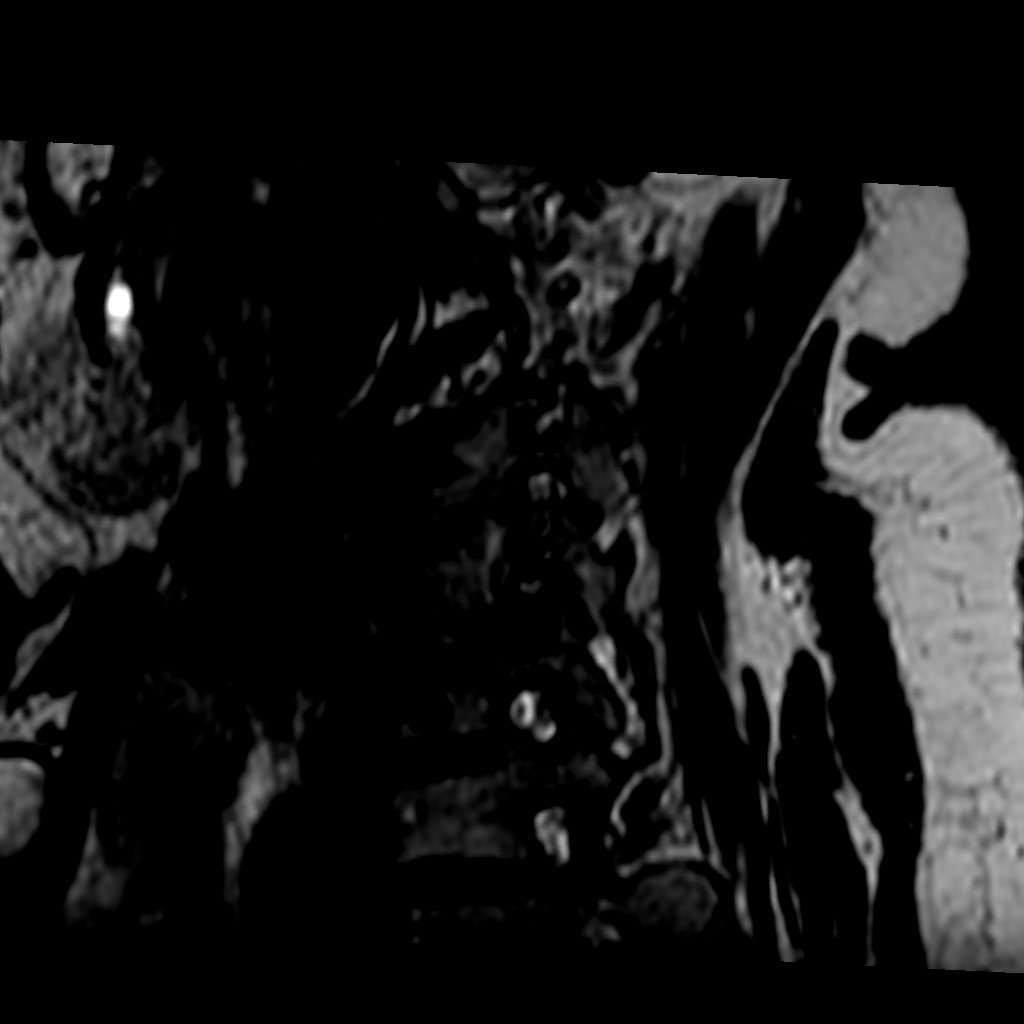
[im 36/36]
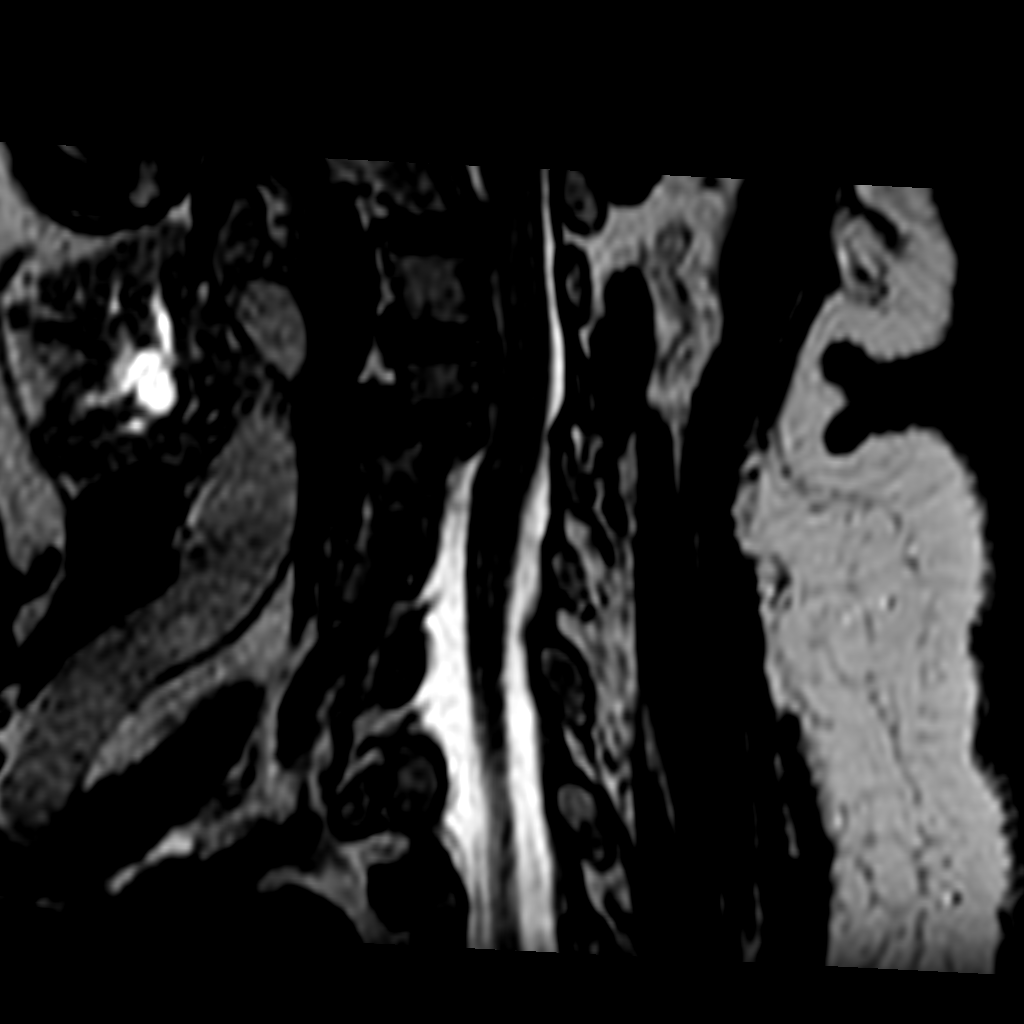

[Series 601: T2 · axial · 3.0mm · 0.29mm/px · z∈[-235,-154]mm · 2 of 21 slices shown]
[im 1/21]
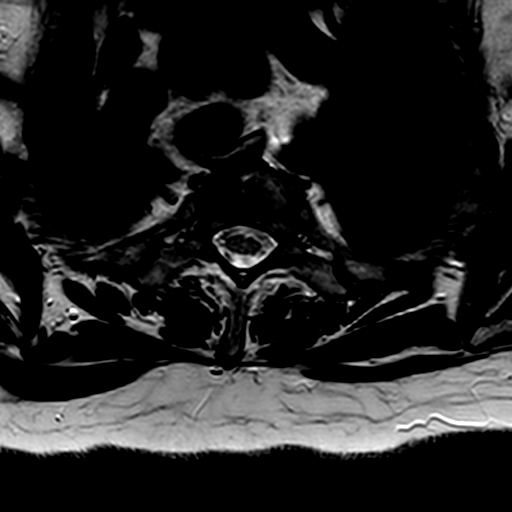
[im 21/21]
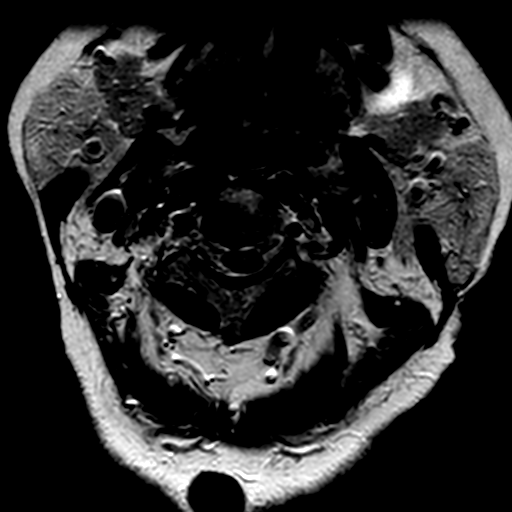

[11 of 48 positions shown; findings below may reference images not displayed]

FINDINGS: C5-C7 ACDF. There appears to be solid osseous bridging across C5-C6 without 
definite osseous bridging across C6-C7. However, CT would better assess. 
-------------------------------------------------------------------------------- 
----------------- 
GENERAL: 
ALIGNMENT: Minimal levoconvex cervical thoracic scoliosis. Loss of normal 
cervical lordosis with mild cervical kyphosis, apex posterior at C3-C4. There is 
grade 1 retrolisthesis C4 on C5. Trace grade 1 anterolisthesis C7 on T1. 
VERTEBRAL BODY HEIGHT: Normal.  
MARROW SIGNAL: No focal suspect signal abnormality. 
CORD SIGNAL: Normal.  
ADDITIONAL FINDINGS: There is cystic dilatation of the submandibular ducts 
bilaterally in a relatively symmetric fashion. The submandibular glands are 
preserved in parenchymal volume.. Incompletely visualized is a 17 mm 
subcutaneous lesion involving the posterior subcutaneous soft tissues at the 
craniocervical junction, likely an epidermal or sebaceous cyst. Retropharyngeal 
course of the common carotid arteries bilaterally. 
-------------------------------------------------------------------------------- 
---------------- 
SEGMENTAL: 
CRANIOCERVICAL JUNCTION: No significant stenosis. 
C2-C3: Normal disc height. Central disc osteophyte protrusion abuts and indents 
the ventral cord margin with mild canal stenosis. Foramina patent with normal 
facets. 
C3-C4: Mild loss of disc height. Ventral cord flattening with mild canal 
stenosis. Posterior annular fissure. Bilateral uncinate spurring with mild right 
foraminal narrowing. The left neural foramen is borderline narrowed. Normal 
facets. 
C4-C5: Loss of disc height. Disc osteophyte complex and ligamentum flavum 
hypertrophy with moderate to moderately severe canal stenosis and ventral cord 
flattening. Bilateral uncinate spurring with severe bilateral foraminal 
narrowing. 
C5-C6: Fusion. Canal patent. Foramina patent. Normal facets. 
C6-C7: Fusion. Canal patent. Bilateral uncinate spurring, worse on the left. 
Mild right and severe left foraminal narrowing. Normal facets. 
C7-T1: Normal disc height. No herniation. Normal facets. No spinal canal or 
neural foraminal stenosis. 
-------------------------------------------------------------------------------- 
---------------
IMPRESSION: Cervical spondylosis with post surgical changes as above. No definite osseous 
bridging across C6-C7, although CT would better assess. 
Most significant canal stenosis is at the transition level C4-C5, moderate to 
moderately severe with ventral cord flattening. Severe bilateral foraminal 
narrowing at this level. 
Cystic dilatation of the submandibular ducts bilaterally is likely 
congenital/developmental.

## 2023-10-08 IMAGING — CT CT [PERSON_NAME] KNEE WO CONTRAST
1 of 3 series · 8 of 14 positions shown, 10 images · non-contrast
Comparison: None

________________________________________________________________________________________________ 
CT LAMA M TW KNEE WO CONTRAST, 10/08/2023 [DATE]: 
CLINICAL INDICATION: Unilateral primary osteoarthritis, left knee.
TECHNIQUE: The left lower extremity was scanned without contrast on a 
high-resolution CT scanner using dose reduction techniques and MOULTRIE protocol. 
Routine MPR reconstructions were performed. Count of known CT and Cardiac 
Nuclear Medicine studies performed in the previous 12 months = 0.

[Series 4: lmako knee · axial · 0.45mm/px · z∈[-910,-694]mm · 8 of 396 slices shown, 10 images]
[im 44/396  soft-tissue]
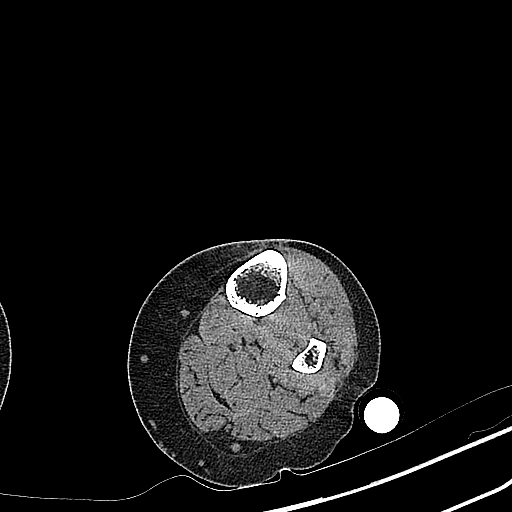
[im 44/396  bone]
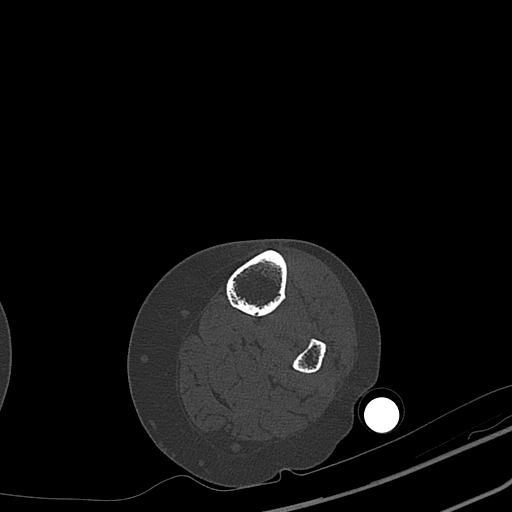
[im 88/396  bone]
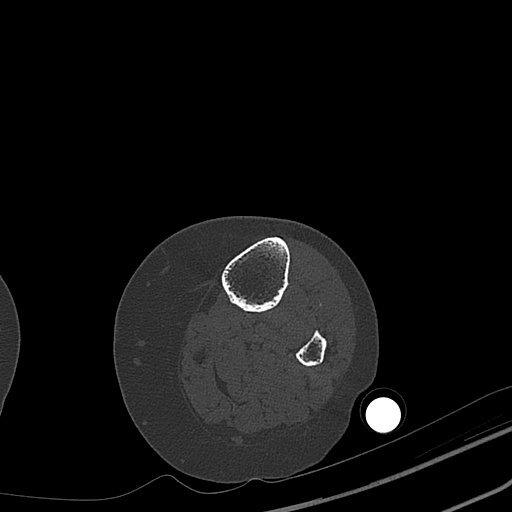
[im 132/396  bone]
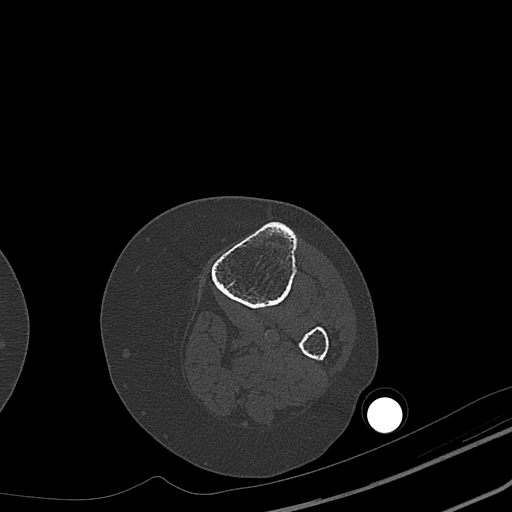
[im 176/396  bone]
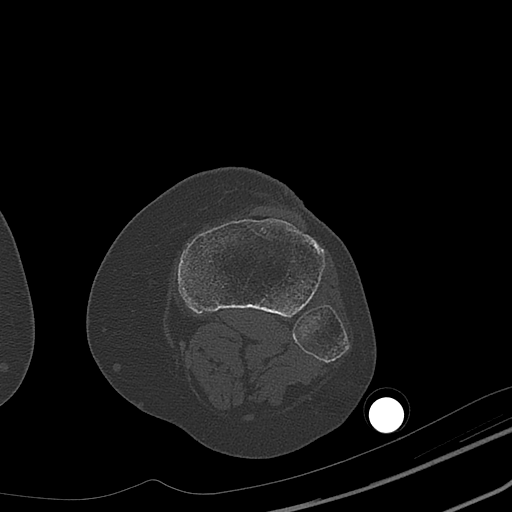
[im 220/396  soft-tissue]
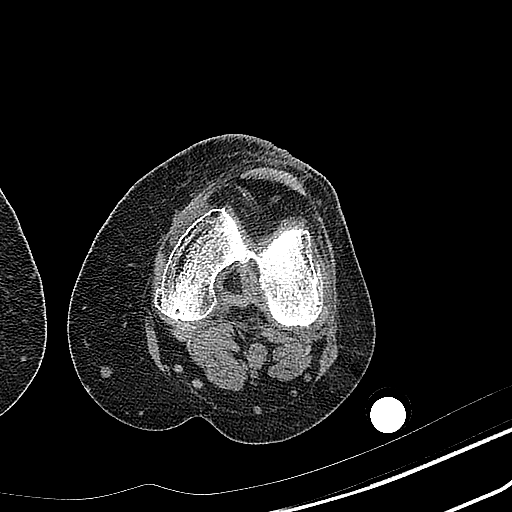
[im 220/396  bone]
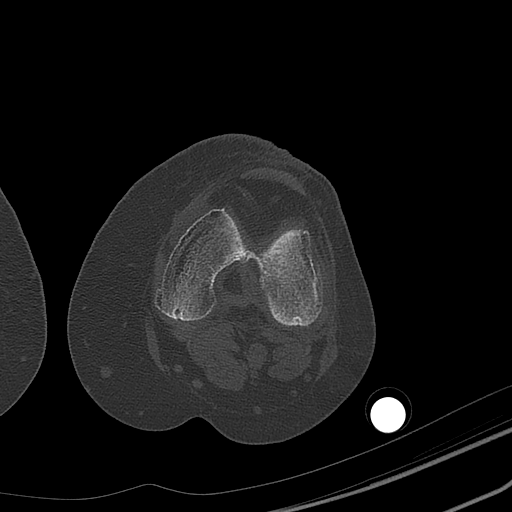
[im 264/396  bone]
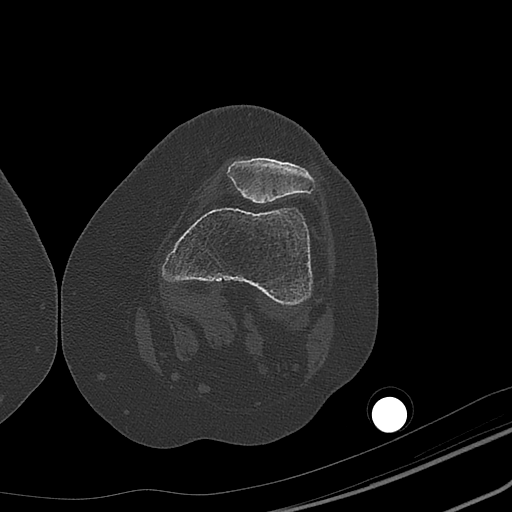
[im 308/396  bone]
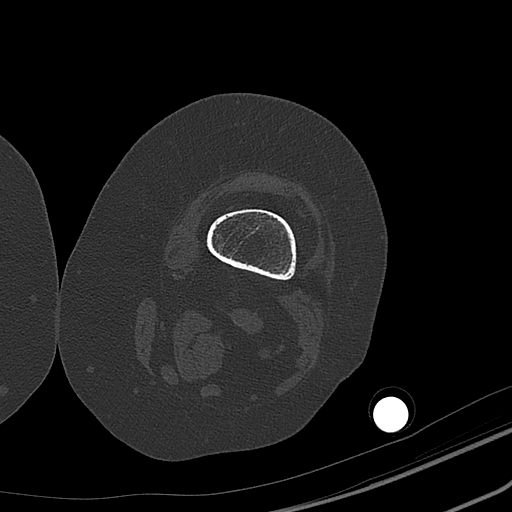
[im 352/396  bone]
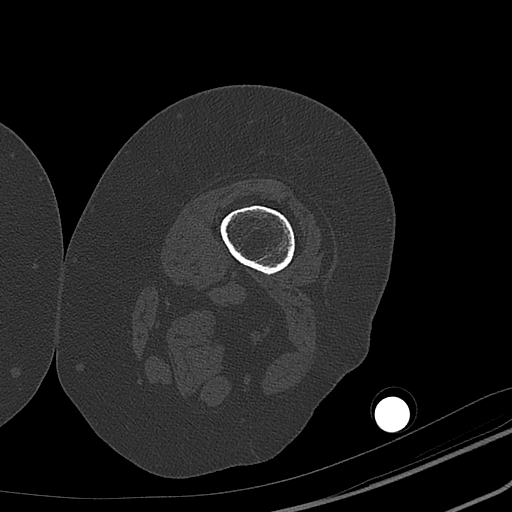

[8 of 14 positions shown; findings below may reference images not displayed]

FINDINGS: HIP: Preserved. 
KNEE: Degenerative change and small joint effusion. 
ANKLE/HINDFOOT: Degenerative change of the midfoot. 1.3 x 0.9 cm medial tibial 
plafond and lucent focus with coarse calcifications likely interosseous lipoma 
with dystrophic calcifications. 
OTHER: No periarticular soft tissue mass or fluid collection. Localizer view 
demonstrates right bimalleolar fixation hardware.
IMPRESSION: Left lower extremity CT using MOULTRIE protocol. 
RADIATION DOSE REDUCTION: All CT scans are performed using radiation dose 
reduction techniques, when applicable. Technical factors are evaluated and 
adjusted to ensure appropriate moderation of exposure. Automated dose management 
technology is applied to adjust the radiation doses to minimize exposure while 
achieving diagnostic quality images.

## 2023-11-13 ENCOUNTER — Encounter (INDEPENDENT_AMBULATORY_CARE_PROVIDER_SITE_OTHER): Payer: Self-pay
